# Patient Record
Sex: Female | Born: 1945 | Race: Black or African American | Hispanic: No | Smoking: Never smoker
Health system: Southern US, Community
[De-identification: ages and names within clinical notes are randomized; demographics above are authoritative.]

## PROBLEM LIST (undated history)

## (undated) DIAGNOSIS — K219 Gastro-esophageal reflux disease without esophagitis: Secondary | ICD-10-CM

## (undated) DIAGNOSIS — D472 Monoclonal gammopathy: Principal | ICD-10-CM

## (undated) DIAGNOSIS — I10 Essential (primary) hypertension: Secondary | ICD-10-CM

## (undated) DIAGNOSIS — C801 Malignant (primary) neoplasm, unspecified: Secondary | ICD-10-CM

## (undated) HISTORY — PX: ABDOMINAL HYSTERECTOMY: SHX81

## (undated) HISTORY — PX: BREAST SURGERY: SHX581

## (undated) HISTORY — PX: BREAST CYST EXCISION: SHX579

## (undated) HISTORY — PX: KNEE ARTHROSCOPY: SUR90

## (undated) HISTORY — PX: EYE SURGERY: SHX253

## (undated) HISTORY — PX: CATARACT EXTRACTION W/ INTRAOCULAR LENS IMPLANT: SHX1309

## (undated) HISTORY — DX: Monoclonal gammopathy: D47.2

---

## 2013-05-01 DIAGNOSIS — I10 Essential (primary) hypertension: Secondary | ICD-10-CM | POA: Insufficient documentation

## 2014-06-03 DIAGNOSIS — M5386 Other specified dorsopathies, lumbar region: Secondary | ICD-10-CM | POA: Insufficient documentation

## 2014-06-23 ENCOUNTER — Encounter (HOSPITAL_BASED_OUTPATIENT_CLINIC_OR_DEPARTMENT_OTHER): Payer: Self-pay

## 2014-06-23 ENCOUNTER — Emergency Department (HOSPITAL_BASED_OUTPATIENT_CLINIC_OR_DEPARTMENT_OTHER)
Admission: EM | Admit: 2014-06-23 | Discharge: 2014-06-23 | Disposition: A | Discharge status: Home / Self Care | Payer: Medicare Other | Attending: Emergency Medicine | Admitting: Emergency Medicine

## 2014-06-23 DIAGNOSIS — Z7982 Long term (current) use of aspirin: Secondary | ICD-10-CM | POA: Diagnosis not present

## 2014-06-23 DIAGNOSIS — R002 Palpitations: Secondary | ICD-10-CM | POA: Diagnosis not present

## 2014-06-23 DIAGNOSIS — G4700 Insomnia, unspecified: Secondary | ICD-10-CM | POA: Insufficient documentation

## 2014-06-23 DIAGNOSIS — F419 Anxiety disorder, unspecified: Secondary | ICD-10-CM | POA: Diagnosis present

## 2014-06-23 DIAGNOSIS — Z79899 Other long term (current) drug therapy: Secondary | ICD-10-CM | POA: Diagnosis not present

## 2014-06-23 DIAGNOSIS — I10 Essential (primary) hypertension: Secondary | ICD-10-CM | POA: Insufficient documentation

## 2014-06-23 HISTORY — DX: Essential (primary) hypertension: I10

## 2014-06-23 LAB — CBC WITH DIFFERENTIAL/PLATELET
BASOS PCT: 1 % (ref 0–1)
Basophils Absolute: 0 10*3/uL (ref 0.0–0.1)
EOS PCT: 2 % (ref 0–5)
Eosinophils Absolute: 0.1 10*3/uL (ref 0.0–0.7)
HCT: 36.3 % (ref 36.0–46.0)
HEMOGLOBIN: 12.2 g/dL (ref 12.0–15.0)
Lymphocytes Relative: 25 % (ref 12–46)
Lymphs Abs: 1.6 10*3/uL (ref 0.7–4.0)
MCH: 27.9 pg (ref 26.0–34.0)
MCHC: 33.6 g/dL (ref 30.0–36.0)
MCV: 82.9 fL (ref 78.0–100.0)
Monocytes Absolute: 0.4 10*3/uL (ref 0.1–1.0)
Monocytes Relative: 7 % (ref 3–12)
NEUTROS ABS: 4 10*3/uL (ref 1.7–7.7)
Neutrophils Relative %: 65 % (ref 43–77)
Platelets: 397 10*3/uL (ref 150–400)
RBC: 4.38 MIL/uL (ref 3.87–5.11)
RDW: 15 % (ref 11.5–15.5)
WBC: 6.2 10*3/uL (ref 4.0–10.5)

## 2014-06-23 LAB — BASIC METABOLIC PANEL
Anion gap: 7 (ref 5–15)
BUN: 18 mg/dL (ref 6–23)
CHLORIDE: 103 mmol/L (ref 96–112)
CO2: 26 mmol/L (ref 19–32)
CREATININE: 0.84 mg/dL (ref 0.50–1.10)
Calcium: 9.4 mg/dL (ref 8.4–10.5)
GFR calc non Af Amer: 69 mL/min — ABNORMAL LOW (ref >90)
GFR, EST AFRICAN AMERICAN: 80 mL/min — AB (ref >90)
GLUCOSE: 104 mg/dL — AB (ref 70–99)
POTASSIUM: 3.6 mmol/L (ref 3.5–5.1)
Sodium: 136 mmol/L (ref 135–145)

## 2014-06-23 LAB — URINALYSIS, ROUTINE W REFLEX MICROSCOPIC
BILIRUBIN URINE: NEGATIVE
Glucose, UA: NEGATIVE mg/dL
Hgb urine dipstick: NEGATIVE
Ketones, ur: NEGATIVE mg/dL
LEUKOCYTES UA: NEGATIVE
Nitrite: NEGATIVE
Protein, ur: NEGATIVE mg/dL
Specific Gravity, Urine: 1.014 (ref 1.005–1.030)
Urobilinogen, UA: 0.2 mg/dL (ref 0.0–1.0)
pH: 6 (ref 5.0–8.0)

## 2014-06-23 LAB — TROPONIN I: Troponin I: <0.03 ng/mL (ref <0.031)

## 2014-06-23 LAB — TSH: TSH: 2.451 u[IU]/mL (ref 0.350–4.500)

## 2014-06-23 NOTE — ED Provider Notes (Signed)
CSN: [id number]     Arrival date & time 06/23/14  1317 History   First MD Initiated Contact with Patient 06/23/14 1358     Chief Complaint  Patient presents with  . Irregular Heart Beat     (Consider location/radiation/quality/duration/timing/severity/associated sxs/prior Treatment) HPI Kelly Chase is a 69 y.o. female with hx of htn, presents to ED with complaint of anxiety, palpitations, feeling like heart skipping a beat. Pt states she is often very anxious. States that today she was sitting down and reading when symptoms began. States she did not have chest pain, no sob, no dizziness, states "i just did not feel right." symptoms lasted just few seconds and went away. States she felt very anxious after so came to ED. States that she believes her anxiety is from not sleeping. States she on average gets 2-3 hrs of sleep. Her doctor has started her on trazadone but she is not taking it because it makes her groggy the next day. She is currently asymptomatic, family at bedside.   Past Medical History  Diagnosis Date  . Hypertension    Past Surgical History  Procedure Laterality Date  . Eye surgery    . Abdominal hysterectomy    . Breast surgery    . Knee arthroscopy     No family history on file. History  Substance Use Topics  . Smoking status: Never Smoker   . Smokeless tobacco: Not on file  . Alcohol Use: No   OB History    No data available     Review of Systems  Constitutional: Negative for fever and chills.  Respiratory: Negative for cough, chest tightness and shortness of breath.   Cardiovascular: Positive for palpitations. Negative for chest pain and leg swelling.  Gastrointestinal: Negative for nausea, vomiting, abdominal pain and diarrhea.  Genitourinary: Negative for dysuria and flank pain.  Musculoskeletal: Negative for myalgias, arthralgias, neck pain and neck stiffness.  Skin: Negative for rash.  Neurological: Negative for dizziness, weakness and headaches.  All  other systems reviewed and are negative.     Allergies  Esomeprazole and Feldene  Home Medications   Prior to Admission medications   Medication Sig Start Date End Date Taking? Authorizing Provider  amLODipine (NORVASC) 10 MG tablet Take 10 mg by mouth daily.   Yes Historical Provider, MD  aspirin 81 MG tablet Take 81 mg by mouth daily.   Yes Historical Provider, MD  lisinopril (PRINIVIL,ZESTRIL) 40 MG tablet Take 40 mg by mouth daily.   Yes Historical Provider, MD   BP 166/89 mmHg  Pulse 75  Temp(Src) 97.7 F (36.5 C) (Oral)  Resp 18  Ht 5\' 6"  (1.676 m)  Wt 185 lb (83.915 kg)  BMI 29.87 kg/m2  SpO2 100% Physical Exam  Constitutional: She appears well-developed and well-nourished. No distress.  HENT:  Head: Normocephalic.  Eyes: Conjunctivae are normal.  Neck: Neck supple.  Cardiovascular: Normal rate, regular rhythm and normal heart sounds.   Pulmonary/Chest: Effort normal and breath sounds normal. No respiratory distress. She has no wheezes. She has no rales. She exhibits no tenderness.  Abdominal: Soft. Bowel sounds are normal. She exhibits no distension. There is no tenderness. There is no rebound.  Musculoskeletal: She exhibits no edema.  Neurological: She is alert.  Skin: Skin is warm and dry.  Psychiatric: She has a normal mood and affect. Her behavior is normal.  Nursing note and vitals reviewed.   ED Course  Procedures (including critical care time) Labs Review Labs Reviewed  BASIC  METABOLIC PANEL - Abnormal; Notable for the following:    Glucose, Bld 104 (*)    GFR calc non Af Amer 69 (*)    GFR calc Af Amer 80 (*)    All other components within normal limits  URINALYSIS, ROUTINE W REFLEX MICROSCOPIC  CBC WITH DIFFERENTIAL/PLATELET  TROPONIN I  TSH    Imaging Review No results found.   EKG Interpretation   Date/Time:  Sunday June 23 2014 13:30:32 EDT Ventricular Rate:  78 PR Interval:  150 QRS Duration: 82 QT Interval:  376 QTC  Calculation: 428 R Axis:   35 Text Interpretation:  Normal sinus rhythm Nonspecific ST and T wave  abnormality Abnormal ECG No old tracing to compare Confirmed by [facility]   MD, TREY ([id number]) on 06/23/2014 1:55:46 PM      MDM   Final diagnoses:  Palpitations  Anxiety  Insomnia    Patient with anxiety, insomnia, palpitations earlier today that lasted just a few seconds. EKG here is unremarkable. Patient is in normal sinus rhythm. Vital signs show elevated blood pressure, otherwise normal. Will monitor, check labs, troponin, TSH.  3:25 PM pts labs are all within normal. Pt feeling well. bp still elevated, did not take her meds at home. Symptoms most likely due to PVCs. Advised to follow up closely with pcp, event monitor if continues to have symptoms. No PVCs or arrhythmias while in ED. Advised melatonin for sleep. Return precautions discussed.   Filed Vitals:   06/23/14 1323 06/23/14 1521  BP: 166/89 157/80  Pulse: 75 70  Temp: 97.7 F (36.5 C)   TempSrc: Oral   Resp: 18 16  Height: 5\' 6"  (1.676 m)   Weight: 185 lb (83.915 kg)   SpO2: 100% 100%     Jeannett Senior, PA-C 06/23/14 [place], MD 06/24/14 0030

## 2014-06-23 NOTE — Discharge Instructions (Signed)
Please follow up with primary care doctor for recheck of your blood pressure and further evaluation of your symptoms. You may need an event monitor test if these continue. Try melatonin for sleep. Return if worsening symptoms.   Premature Beats A premature beat is an extra heartbeat that happens earlier than normal. Premature beats are called premature atrial contractions (PACs) or premature ventricular contractions (PVCs) depending on the area of the heart where they start. CAUSES  Premature beats may be brought on by a variety of factors including:  Emotional stress.  Lack of sleep.  Caffeine.  Asthma medicines.  Stimulants.  Herbal teas.  Dietary supplements.  Alcohol. In most cases, premature beats are not dangerous and are not a sign of serious heart disease. Most patients evaluated for premature beats have completely normal heart function. Rarely, premature beats may be a sign of more significant heart problems or medical illness. SYMPTOMS  Premature beats may cause palpitations. This means you feel like your heart is skipping a beat or beating harder than usual. Sometimes, slight chest pain occurs with premature beats, lasting only a few seconds. This pain has been described as a "flopping" feeling inside the chest. In many cases, premature beats do not cause any symptoms and they are only detected when an electrocardiography test (EKG) or heart monitoring is performed. DIAGNOSIS  Your caregiver may run some tests to evaluate your heart such as an EKG or echocardiography. You may need to wear a portable heart monitor for several days to record the electrical activity of your heart. Blood testing may also be performed to check your electrolytes and thyroid function. TREATMENT  Premature beats usually go away with rest. If the problem continues, your caregiver will determine a treatment plan for you.  HOME CARE INSTRUCTIONS  Get plenty of rest over the next few days until your  symptoms improve.  Avoid coffee, tea, alcohol, and soda (pop, cola).  Do not smoke. SEEK MEDICAL CARE IF:  Your symptoms continue after 1 to 2 days of rest.  You have new symptoms, such as chest pain or trouble breathing. SEEK IMMEDIATE MEDICAL CARE IF:  You have severe chest pain or abdominal pain.  You have pain that radiates into the neck, arm, or jaw.  You faint or have extreme weakness.  You have shortness of breath.  Your heartbeat races for more than 5 seconds. MAKE SURE YOU:  Understand these instructions.  Will watch your condition.  Will get help right away if you are not doing well or get worse. Document Released: 04/15/2004 Document Revised: 05/31/2011 Document Reviewed: 11/09/2010 [facility] Patient Information 2015 [place], [place]. This information is not intended to replace advice given to you by your health care provider. Make sure you discuss any questions you have with your health care provider.

## 2014-06-23 NOTE — ED Provider Notes (Signed)
Patient had 2 episodes of "missed heartbeat" lasting a split-second today at noon and again at 12:30 PM. No weakness no shortness of breath no chest pain. She reports a milder episode ,, diagnosed palpitations or gets also lasting a split-second earlier this week. Presently asymptomatic. No distress lungs clear auscultation heart regular rate and rhythm no murmurs  Orlie Dakin, MD 06/23/14 1525

## 2014-06-23 NOTE — ED Notes (Signed)
Pt reports today was sitting down and suddenly felt as if her heart skipped a beat and then felt "nervous".  Denies cp or sob.  States she had another episode of same during beginning of week.

## 2014-08-23 ENCOUNTER — Telehealth: Payer: Self-pay | Admitting: Hematology & Oncology

## 2014-08-23 NOTE — Telephone Encounter (Signed)
Lt mess regarding future np appt for 7/5 at 10:30am.

## 2014-08-26 ENCOUNTER — Telehealth: Payer: Self-pay | Admitting: Hematology & Oncology

## 2014-08-26 NOTE — Telephone Encounter (Signed)
Pt confirm np appt for 7/5.

## 2014-09-20 ENCOUNTER — Telehealth: Payer: Self-pay

## 2014-09-20 NOTE — Telephone Encounter (Signed)
LVM to call back and confirm appt for 09/24/14 starting at 10:30am

## 2014-09-24 ENCOUNTER — Other Ambulatory Visit (HOSPITAL_BASED_OUTPATIENT_CLINIC_OR_DEPARTMENT_OTHER): Payer: Medicare Other

## 2014-09-24 ENCOUNTER — Encounter: Payer: Self-pay | Admitting: Hematology & Oncology

## 2014-09-24 ENCOUNTER — Ambulatory Visit (HOSPITAL_BASED_OUTPATIENT_CLINIC_OR_DEPARTMENT_OTHER): Payer: Medicare Other | Admitting: Hematology & Oncology

## 2014-09-24 ENCOUNTER — Ambulatory Visit: Payer: Medicare Other

## 2014-09-24 VITALS — BP 150/72 | HR 84 | Temp 97.6°F | Resp 14 | Ht 66.0 in | Wt 186.0 lb

## 2014-09-24 DIAGNOSIS — D472 Monoclonal gammopathy: Secondary | ICD-10-CM | POA: Diagnosis present

## 2014-09-24 HISTORY — DX: Monoclonal gammopathy: D47.2

## 2014-09-24 LAB — CBC WITH DIFFERENTIAL (CANCER CENTER ONLY)
BASO#: 0 10*3/uL (ref 0.0–0.2)
BASO%: 0.6 % (ref 0.0–2.0)
EOS%: 5.3 % (ref 0.0–7.0)
Eosinophils Absolute: 0.3 10*3/uL (ref 0.0–0.5)
HCT: 34.6 % — ABNORMAL LOW (ref 34.8–46.6)
HGB: 11.7 g/dL (ref 11.6–15.9)
LYMPH#: 2.4 10*3/uL (ref 0.9–3.3)
LYMPH%: 45.2 % (ref 14.0–48.0)
MCH: 28.2 pg (ref 26.0–34.0)
MCHC: 33.8 g/dL (ref 32.0–36.0)
MCV: 83 fL (ref 81–101)
MONO#: 0.4 10*3/uL (ref 0.1–0.9)
MONO%: 7.2 % (ref 0.0–13.0)
NEUT#: 2.2 10*3/uL (ref 1.5–6.5)
NEUT%: 41.7 % (ref 39.6–80.0)
PLATELETS: 354 10*3/uL (ref 145–400)
RBC: 4.15 10*6/uL (ref 3.70–5.32)
RDW: 14.4 % (ref 11.1–15.7)
WBC: 5.3 10*3/uL (ref 3.9–10.0)

## 2014-09-24 LAB — COMPREHENSIVE METABOLIC PANEL
ALK PHOS: 79 U/L (ref 39–117)
ALT: 17 U/L (ref 0–35)
AST: 15 U/L (ref 0–37)
Albumin: 4.2 g/dL (ref 3.5–5.2)
BUN: 15 mg/dL (ref 6–23)
CALCIUM: 9.3 mg/dL (ref 8.4–10.5)
CHLORIDE: 105 meq/L (ref 96–112)
CO2: 26 mEq/L (ref 19–32)
Creatinine, Ser: 0.88 mg/dL (ref 0.50–1.10)
GLUCOSE: 88 mg/dL (ref 70–99)
Potassium: 3.9 mEq/L (ref 3.5–5.3)
Sodium: 136 mEq/L (ref 135–145)
Total Bilirubin: 0.7 mg/dL (ref 0.2–1.2)
Total Protein: 8 g/dL (ref 6.0–8.3)

## 2014-09-24 LAB — CHCC SATELLITE - SMEAR

## 2014-09-24 NOTE — Progress Notes (Signed)
Referral MD  Reason for Referral: MGUS  Chief Complaint  Patient presents with  . NEW PATIENT  : I am not sure why I am here.  HPI: Kelly Chase is very charming 69 year old Afro-American female. She has a very strong faith. We had good fellowship.  She is followed locally. Apparently, she had blood work done back in February which showed a monoclonal spike. [place] as to why this was done. Her M spike was 1.5 g/dL. She had mild anemia. No electrolytes were drawn that I can tell.  She was then referred to [place].  She looks great. She feels okay. She's had no problem with infections. She's had no bone issues. She's had no change in bowel or bladder habits. She gets her mammograms routinely.  There's been no bleeding. She's had no fever. There's been no cough.  Again, she is, referred to the [facility] for an evaluation.    Past Medical History  Diagnosis Date  . Hypertension   :  Past Surgical History  Procedure Laterality Date  . Eye surgery    . Abdominal hysterectomy    . Breast surgery    . Knee arthroscopy    :   Current outpatient prescriptions:  .  amLODipine (NORVASC) 10 MG tablet, Take 10 mg by mouth daily., Disp: , Rfl:  .  aspirin 81 MG tablet, Take 81 mg by mouth daily., Disp: , Rfl:  .  calcium & magnesium carbonates (MYLANTA) 311-232 MG per tablet, Take 1 tablet by mouth as needed for heartburn., Disp: , Rfl:  .  diphenhydrAMINE (SOMINEX) 25 MG tablet, Take 25 mg by mouth as needed for sleep. allergy, Disp: , Rfl:  .  DUREZOL 0.05 % EMUL, PLACE 1 GTT INTO THE LEFT EYE BID, Disp: , Rfl: 2 .  lisinopril (PRINIVIL,ZESTRIL) 40 MG tablet, Take 40 mg by mouth daily., Disp: , Rfl: :  :  Allergies  Allergen Reactions  . Esomeprazole Other (See Comments)    Joint pain  . Feldene [Piroxicam] Other (See Comments)    abd "burning"  :  No family history on file.:  History   Social History  . Marital Status: Single    Spouse Name: N/A  .  Number of Children: N/A  . Years of Education: N/A   Occupational History  . Not on file.   Social History Main Topics  . Smoking status: Never Smoker   . Smokeless tobacco: Never Used     Comment: NEVER USED TOBACCO  . Alcohol Use: No  . Drug Use: No  . Sexual Activity: Not on file   Other Topics Concern  . Not on file   Social History Narrative  :  Pertinent items are noted in HPI.  Exam: _0 @ Wellwell-developed well-nourished African-American female. Head and neck exam shows no ocular or oral lesion. There are no palpable cervical or supraclavicular lymph nodes. Lungs are clear. Cardiac exam regular rate and rhythm with no murmurs, rubs or bruits. Abdomen is soft. She has good bowel sounds. There is no fluid wave. There is no palpable liver or spleen tip. Back exam shows no tenderness over the spine, ribs or hips. Extremities shows no clubbing, cyanosis or edema. Skin exam shows no rashes. Neurological exam is nonfocal.   Recent Labs  09/24/14 1033  WBC 5.3  HGB 11.7  HCT 34.6*  PLT 354   No results for input(s): NA, K, CL, CO2, GLUCOSE, BUN, CREATININE, CALCIUM in the last 72 hours.  Blood  smear review: normochromic and no was a population of red blood cells. There is no target cells. There is no rouleau formation. She has no nucleated red blood cells. White blood cells appear normal in morphology and maturation. There is no immature myeloid or lymphoid forms. There's no atypical lymphocytes. Platelets are adequate in number and size.  Pathology:none    Assessment and Plan: Kelly Chase is a 69 year old aftermarket female with an M spike. I suppose this is MGUS. I be very surprised if she has myeloma or even smoldering myeloma.  We will see what her lab work shows. I don't want to get too aggressive right now. Her blood smear is unremarkable. Her physical exam is pretty much normal.  I think we can just follow her along. If we find significant abnormalities on  her blood work, then we can certainly pursue additional studies, such as bone survey and ultimately a bone marrow biopsy.  I spent about 45 minutes with her. I told her that we would call her once we got the results back from our labs.  I answered all of her questions. We had a great prayer session. She wanted me to pray with her.

## 2014-09-26 LAB — PROTEIN ELECTROPHORESIS, SERUM, WITH REFLEX
ABNORMAL PROTEIN BAND1: 1.3 g/dL
ALPHA-1-GLOBULIN: 0.4 g/dL — AB (ref 0.2–0.3)
ALPHA-2-GLOBULIN: 0.5 g/dL (ref 0.5–0.9)
Albumin ELP: 4.3 g/dL (ref 3.8–4.8)
Beta 2: 0.5 g/dL (ref 0.2–0.5)
Beta Globulin: 0.5 g/dL (ref 0.4–0.6)
Gamma Globulin: 2.2 g/dL — ABNORMAL HIGH (ref 0.8–1.7)
Total Protein, Serum Electrophoresis: 8.3 g/dL — ABNORMAL HIGH (ref 6.1–8.1)

## 2014-09-26 LAB — KAPPA/LAMBDA LIGHT CHAINS
KAPPA LAMBDA RATIO: 1.66 — AB (ref 0.26–1.65)
Kappa free light chain: 2.46 mg/dL — ABNORMAL HIGH (ref 0.33–1.94)
Lambda Free Lght Chn: 1.48 mg/dL (ref 0.57–2.63)

## 2014-09-26 LAB — IGG, IGA, IGM
IGM, SERUM: 139 mg/dL (ref 52–322)
IgA: 283 mg/dL (ref 69–380)
IgG (Immunoglobin G), Serum: 2250 mg/dL — ABNORMAL HIGH (ref 690–1700)

## 2014-10-11 ENCOUNTER — Other Ambulatory Visit: Payer: Self-pay | Admitting: Hematology & Oncology

## 2014-10-11 DIAGNOSIS — D472 Monoclonal gammopathy: Secondary | ICD-10-CM

## 2014-12-11 ENCOUNTER — Other Ambulatory Visit: Payer: Self-pay | Admitting: Hematology & Oncology

## 2014-12-11 DIAGNOSIS — D472 Monoclonal gammopathy: Secondary | ICD-10-CM

## 2015-01-14 ENCOUNTER — Ambulatory Visit: Payer: Medicare Other | Admitting: Hematology & Oncology

## 2015-01-14 ENCOUNTER — Other Ambulatory Visit: Payer: Medicare Other

## 2015-02-04 ENCOUNTER — Encounter (HOSPITAL_BASED_OUTPATIENT_CLINIC_OR_DEPARTMENT_OTHER): Payer: Self-pay | Admitting: Emergency Medicine

## 2015-02-04 ENCOUNTER — Emergency Department (HOSPITAL_BASED_OUTPATIENT_CLINIC_OR_DEPARTMENT_OTHER)
Admission: EM | Admit: 2015-02-04 | Discharge: 2015-02-04 | Disposition: A | Discharge status: Home / Self Care | Payer: Medicare Other | Attending: Emergency Medicine | Admitting: Emergency Medicine

## 2015-02-04 DIAGNOSIS — F419 Anxiety disorder, unspecified: Secondary | ICD-10-CM

## 2015-02-04 DIAGNOSIS — I10 Essential (primary) hypertension: Secondary | ICD-10-CM | POA: Insufficient documentation

## 2015-02-04 DIAGNOSIS — Z7982 Long term (current) use of aspirin: Secondary | ICD-10-CM | POA: Insufficient documentation

## 2015-02-04 DIAGNOSIS — Z79899 Other long term (current) drug therapy: Secondary | ICD-10-CM | POA: Diagnosis not present

## 2015-02-04 DIAGNOSIS — G4700 Insomnia, unspecified: Secondary | ICD-10-CM | POA: Insufficient documentation

## 2015-02-04 DIAGNOSIS — Z862 Personal history of diseases of the blood and blood-forming organs and certain disorders involving the immune mechanism: Secondary | ICD-10-CM | POA: Insufficient documentation

## 2015-02-04 MED ORDER — ZOLPIDEM TARTRATE 5 MG PO TABS: 5.0000 mg | ORAL_TABLET | Freq: Every evening | ORAL | Status: DC | PRN

## 2015-02-04 NOTE — ED Notes (Signed)
Patient states that about an hour ago she started to feel jittery and "anxious" - she feels like she was having a panic attack. The patient reports that she feels a little weak now and a little nervous. The patient reports that mostly all the other symptoms are gone

## 2015-02-04 NOTE — Discharge Instructions (Signed)
You were seen today for anxiety.  You are also having trouble sleeping.  You need to follow-up with your primary doctor for medication recommendations.  Panic Attacks Panic attacks are sudden, short feelings of great fear or discomfort. You may have them for no reason when you are relaxed, when you are uneasy (anxious), or when you are sleeping.  HOME CARE  Take all your medicines as told.  Check with your doctor before starting new medicines.  Keep all doctor visits. GET HELP IF:  You are not able to take your medicines as told.  Your symptoms do not get better.  Your symptoms get worse. GET HELP RIGHT AWAY IF:  Your attacks seem different than your normal attacks.  You have thoughts about hurting yourself or others.  You take panic attack medicine and you have a side effect. MAKE SURE YOU:  Understand these instructions.  Will watch your condition.  Will get help right away if you are not doing well or get worse.   This information is not intended to replace advice given to you by your health care provider. Make sure you discuss any questions you have with your health care provider.   Document Released: 04/10/2010 Document Revised: 12/27/2012 Document Reviewed: 10/20/2012 Elsevier Interactive Patient Education [employer].

## 2015-02-04 NOTE — ED Provider Notes (Signed)
CSN: [id number]     Arrival date & time 02/04/15  1910 History  By signing my name below, I, Kelly Chase, attest that this documentation has been prepared under the direction and in the presence of Merryl Hacker, MD.  Electronically Signed: Tula Chase, ED Scribe. 02/04/2015. 11:15 PM.   Chief Complaint  Patient presents with  . Anxiety   The history is provided by the patient. No language interpreter was used.    HPI Comments: Kelly Chase is a 69 y.o. female with a history of HTN who presents to the Emergency Department complaining of anxiety and feeling jittery that occurred earlier today, but is resolved. Pt reports a history of the same that occurs when she does not get a lot of sleep. She was evaluated by her PCP for the same several months ago, who prescribed Trazadone with no relief to her symptoms. Patient states that trazodone does not help her sleep. She has not slept well the last several days. She is currently asymptomatic. Pt denies allergies to medications. She also denies CP and SOB as associated symptoms.  Past Medical History  Diagnosis Date  . Hypertension   . MGUS (monoclonal gammopathy of unknown significance) 09/24/2014   Past Surgical History  Procedure Laterality Date  . Eye surgery    . Abdominal hysterectomy    . Breast surgery    . Knee arthroscopy     History reviewed. No pertinent family history. Social History  Substance Use Topics  . Smoking status: Never Smoker   . Smokeless tobacco: Never Used     Comment: NEVER USED TOBACCO  . Alcohol Use: No   OB History    No data available     Review of Systems  Constitutional: Negative for fever.  Respiratory: Negative for shortness of breath.   Cardiovascular: Negative for chest pain.  Gastrointestinal: Negative for abdominal pain.  Psychiatric/Behavioral: The patient is nervous/anxious.   All other systems reviewed and are negative.  Allergies  Esomeprazole and Feldene  Home  Medications   Prior to Admission medications   Medication Sig Start Date End Date Taking? Authorizing Provider  amLODipine (NORVASC) 10 MG tablet Take 10 mg by mouth daily.    Historical Provider, MD  aspirin 81 MG tablet Take 81 mg by mouth daily.    Historical Provider, MD  calcium & magnesium carbonates (MYLANTA) 311-232 MG per tablet Take 1 tablet by mouth as needed for heartburn.    Historical Provider, MD  diphenhydrAMINE (SOMINEX) 25 MG tablet Take 25 mg by mouth as needed for sleep. allergy    Historical Provider, MD  DUREZOL 0.05 % EMUL PLACE 1 GTT INTO THE LEFT EYE BID 09/14/14   Historical Provider, MD  lisinopril (PRINIVIL,ZESTRIL) 40 MG tablet Take 40 mg by mouth daily.    Historical Provider, MD  zolpidem (AMBIEN) 5 MG tablet Take 1 tablet (5 mg total) by mouth at bedtime as needed for sleep. 02/04/15   Merryl Hacker, MD   BP 145/85 mmHg  Pulse 82  Temp(Src) 97.8 F (36.6 C) (Oral)  Resp 16  Ht 5\' 5"  (1.651 m)  Wt 184 lb (83.462 kg)  BMI 30.62 kg/m2  SpO2 100% Physical Exam  Constitutional: She is oriented to person, place, and time. She appears well-developed and well-nourished.  Appears younger than stated age  HENT:  Head: Normocephalic and atraumatic.  Cardiovascular: Normal rate, regular rhythm and normal heart sounds.   No murmur heard. Pulmonary/Chest: Effort normal and breath sounds normal.  No respiratory distress. She has no wheezes.  Musculoskeletal: She exhibits no edema.  Neurological: She is alert and oriented to person, place, and time.  Skin: Skin is warm and dry.  Psychiatric:  Calm  Nursing note and vitals reviewed.   ED Course  Procedures  DIAGNOSTIC STUDIES: Oxygen Saturation is 100% on RA, normal by my interpretation.    COORDINATION OF CARE: 11:14 PM Discussed treatment plan with pt which includes Ambien and follow-up with her PCP. Pt agreed to plan.  Labs Review Labs Reviewed - No data to display  Imaging Review No results  found.   EKG Interpretation None      MDM   Final diagnoses:  Anxiety  Insomnia    Patient presents with feelings of anxiety and jitteriness. Nontoxic on exam. Afebrile and vital signs are reassuring. She is currently asymptomatic. Relates symptoms to difficulty sleeping. She reports that trazodone is not helping her. She has no other systemic symptoms. She is requesting something for sleep. Patient reports that she was seen by her primary physician on Tuesday and had "normal lab work." No further laboratory or imaging evaluation at this time. I discussed with the patient that she would ultimately need to follow-up with her primary physician regarding sleep medication; however, I will provide her with a limited prescription of Ambien to see if this helps. Patient stated understanding.  After history, exam, and medical workup I feel the patient has been appropriately medically screened and is safe for discharge home. Pertinent diagnoses were discussed with the patient. Patient was given return precautions.  I personally performed the services described in this documentation, which was scribed in my presence. The recorded information has been reviewed and is accurate.    Merryl Hacker, MD 02/05/15 [phone]

## 2015-04-11 ENCOUNTER — Encounter: Payer: Self-pay | Admitting: Hematology & Oncology

## 2015-04-11 ENCOUNTER — Other Ambulatory Visit (HOSPITAL_BASED_OUTPATIENT_CLINIC_OR_DEPARTMENT_OTHER): Payer: Medicare Other

## 2015-04-11 ENCOUNTER — Other Ambulatory Visit: Payer: Self-pay | Admitting: Hematology & Oncology

## 2015-04-11 ENCOUNTER — Ambulatory Visit (HOSPITAL_BASED_OUTPATIENT_CLINIC_OR_DEPARTMENT_OTHER): Payer: Medicare Other | Admitting: Hematology & Oncology

## 2015-04-11 VITALS — BP 150/82 | HR 79 | Temp 97.4°F | Resp 16 | Ht 65.0 in | Wt 184.0 lb

## 2015-04-11 DIAGNOSIS — G4701 Insomnia due to medical condition: Secondary | ICD-10-CM

## 2015-04-11 DIAGNOSIS — Z1231 Encounter for screening mammogram for malignant neoplasm of breast: Secondary | ICD-10-CM

## 2015-04-11 DIAGNOSIS — D472 Monoclonal gammopathy: Secondary | ICD-10-CM

## 2015-04-11 DIAGNOSIS — C50011 Malignant neoplasm of nipple and areola, right female breast: Secondary | ICD-10-CM

## 2015-04-11 DIAGNOSIS — Z1239 Encounter for other screening for malignant neoplasm of breast: Secondary | ICD-10-CM

## 2015-04-11 DIAGNOSIS — N63 Unspecified lump in unspecified breast: Secondary | ICD-10-CM

## 2015-04-11 DIAGNOSIS — C50012 Malignant neoplasm of nipple and areola, left female breast: Secondary | ICD-10-CM

## 2015-04-11 LAB — CMP (CANCER CENTER ONLY)
ALT(SGPT): 24 U/L (ref 10–47)
AST: 21 U/L (ref 11–38)
Albumin: 3.8 g/dL (ref 3.3–5.5)
Alkaline Phosphatase: 76 U/L (ref 26–84)
BUN: 12 mg/dL (ref 7–22)
CALCIUM: 8.9 mg/dL (ref 8.0–10.3)
CHLORIDE: 105 meq/L (ref 98–108)
CO2: 27 mEq/L (ref 18–33)
Creat: 1 mg/dl (ref 0.6–1.2)
GLUCOSE: 88 mg/dL (ref 73–118)
Potassium: 3.6 mEq/L (ref 3.3–4.7)
SODIUM: 140 meq/L (ref 128–145)
Total Bilirubin: 1 mg/dl (ref 0.20–1.60)
Total Protein: 8.8 g/dL — ABNORMAL HIGH (ref 6.4–8.1)

## 2015-04-11 LAB — CBC WITH DIFFERENTIAL (CANCER CENTER ONLY)
BASO#: 0 10*3/uL (ref 0.0–0.2)
BASO%: 0.4 % (ref 0.0–2.0)
EOS ABS: 0.4 10*3/uL (ref 0.0–0.5)
EOS%: 6.8 % (ref 0.0–7.0)
HCT: 34.8 % (ref 34.8–46.6)
HGB: 11.6 g/dL (ref 11.6–15.9)
LYMPH#: 2.2 10*3/uL (ref 0.9–3.3)
LYMPH%: 39.7 % (ref 14.0–48.0)
MCH: 27.8 pg (ref 26.0–34.0)
MCHC: 33.3 g/dL (ref 32.0–36.0)
MCV: 84 fL (ref 81–101)
MONO#: 0.4 10*3/uL (ref 0.1–0.9)
MONO%: 7.5 % (ref 0.0–13.0)
NEUT#: 2.6 10*3/uL (ref 1.5–6.5)
NEUT%: 45.6 % (ref 39.6–80.0)
PLATELETS: 366 10*3/uL (ref 145–400)
RBC: 4.17 10*6/uL (ref 3.70–5.32)
RDW: 14.7 % (ref 11.1–15.7)
WBC: 5.6 10*3/uL (ref 3.9–10.0)

## 2015-04-11 LAB — LACTATE DEHYDROGENASE: LDH: 275 U/L — ABNORMAL HIGH (ref 125–245)

## 2015-04-11 MED ORDER — ZOLPIDEM TARTRATE 5 MG PO TABS: 5.0000 mg | ORAL_TABLET | Freq: Every evening | ORAL | Status: DC | PRN

## 2015-04-11 NOTE — Progress Notes (Signed)
Hematology and Oncology Follow Up Visit  Kelly Chase [id number] 1945-07-29 70 y.o. 04/11/2015   Principle Diagnosis:   IgG Kappa MGUS  Current Therapy:    Observation     Interim History:  Kelly Chase is back for a second office visit. We first saw her back in July 2016. We'll refer saw her, I feel that she had a MGUS. We did do the myeloma studies. She had a monoclonal spike of 1.3 g/dL. This is found to be an IgG Kappa spike. Her IgG level was 2250 mg/dL. Her Kappa Lightchain was 2.46 mg/dL.  She is totally asymptomatic so I do not think that we had to put her through a bone survey.  I did not do 24-hour urine on her.  Since we first saw her, she been doing well. She's had no problems with nausea vomiting. She's had no infections. She's had no bone pain. She's had no change in bowel or bladder habits. There she's not had a mammogram for 2 years. She really needs to have one done.  She's had no problems with leg swelling. She's had no joint issues.    Medications:  Current outpatient prescriptions:  .  amLODipine (NORVASC) 10 MG tablet, Take 10 mg by mouth daily., Disp: , Rfl:  .  aspirin 81 MG tablet, Take 81 mg by mouth daily., Disp: , Rfl:  .  calcium & magnesium carbonates (MYLANTA) 311-232 MG per tablet, Take 1 tablet by mouth as needed for heartburn., Disp: , Rfl:  .  diphenhydrAMINE (SOMINEX) 25 MG tablet, Take 25 mg by mouth as needed for sleep. allergy, Disp: , Rfl:  .  DUREZOL 0.05 % EMUL, PLACE 1 GTT INTO THE LEFT EYE BID, Disp: , Rfl: 2  Allergies:  Allergies  Allergen Reactions  . Esomeprazole Other (See Comments)    Joint pain  . Feldene [Piroxicam] Other (See Comments)    abd "burning"    Past Medical History, Surgical history, Social history, and Family History were reviewed and updated.  Review of Systems: As above  Physical Exam:  height is '5\' 5"'$  (1.651 m) and weight is 184 lb (83.462 kg). Her oral temperature is 97.4 F (36.3 C). Her blood  pressure is 150/82 and her pulse is 79. Her respiration is 16.   Wt Readings from Last 3 Encounters:  04/11/15 184 lb (83.462 kg)  02/04/15 184 lb (83.462 kg)  09/24/14 186 lb (84.369 kg)     Head and neck exam shows no ocular or oral lesion. There are no palpable cervical or supraclavicular lymph nodes. Lungs are clear. Cardiac exam regular rate and rhythm with no murmurs, rubs or bruits. Abdomen is soft. She has good bowel sounds. There is no fluid wave. There is no palpable liver or spleen tip. Back exam shows no tenderness over the spine, ribs or hips. Extremities shows no clubbing, cyanosis or edema. Skin exam shows no rashes. Neurological exam is nonfocal.  Lab Results  Component Value Date   WBC 5.6 04/11/2015   HGB 11.6 04/11/2015   HCT 34.8 04/11/2015   MCV 84 04/11/2015   PLT 366 04/11/2015     Chemistry      Component Value Date/Time   NA 140 04/11/2015 0953   NA 136 09/24/2014 1035   K 3.6 04/11/2015 0953   K 3.9 09/24/2014 1035   CL 105 04/11/2015 0953   CL 105 09/24/2014 1035   CO2 27 04/11/2015 0953   CO2 26 09/24/2014 1035   BUN 12  04/11/2015 0953   BUN 15 09/24/2014 1035   CREATININE 1.0 04/11/2015 0953   CREATININE 0.88 09/24/2014 1035      Component Value Date/Time   CALCIUM 8.9 04/11/2015 0953   CALCIUM 9.3 09/24/2014 1035   ALKPHOS 76 04/11/2015 0953   ALKPHOS 79 09/24/2014 1035   AST 21 04/11/2015 0953   AST 15 09/24/2014 1035   ALT 24 04/11/2015 0953   ALT 17 09/24/2014 1035   BILITOT 1.00 04/11/2015 0953   BILITOT 0.7 09/24/2014 1035         Impression and Plan: Kelly Chase is a 70 year old African-American female with IgG Kappa MGUS. I do not believe that she has myeloma. I see nothing with the labs that show myeloma.  I looked at her blood smear. Her less pain was pretty much benign.  I think we can probably get her back in 6 months. I think this would be reasonable for follow-up.  I don't see need for a bone survey. I do not see  need for a bone marrow biopsy.  She does need to have a mammogram done.  I spent about 30 minutes with her.   Volanda Napoleon, MD 1/20/201711:01 AM

## 2015-04-11 NOTE — Addendum Note (Signed)
Addended by: Burney Gauze R on: 04/11/2015 11:28 AM   Modules accepted: Orders

## 2015-04-12 LAB — IGG, IGA, IGM
IGA/IMMUNOGLOBULIN A, SERUM: 189 mg/dL (ref 87–352)
IGM (IMMUNOGLOBIN M), SRM: 35 mg/dL (ref 26–217)

## 2015-04-12 LAB — BETA 2 MICROGLOBULIN, SERUM: Beta-2: 1.9 mg/L (ref 0.6–2.4)

## 2015-04-14 LAB — KAPPA/LAMBDA LIGHT CHAINS
IG KAPPA FREE LIGHT CHAIN: 25.12 mg/L — AB (ref 3.30–19.40)
IG LAMBDA FREE LIGHT CHAIN: 17.01 mg/L (ref 5.71–26.30)
KAPPA/LAMBDA FLC RATIO: 1.48 (ref 0.26–1.65)

## 2015-04-15 ENCOUNTER — Telehealth: Payer: Self-pay | Admitting: *Deleted

## 2015-04-15 NOTE — Telephone Encounter (Addendum)
Message left on personal voice mail  ----- Message from Volanda Napoleon, MD sent at 04/14/2015  4:50 PM EST ----- Call - blood work looks stable!!  No obvious change in labs!! This is great!!  pete

## 2015-04-16 LAB — PROTEIN ELECTROPHORESIS, SERUM, WITH REFLEX
A/G RATIO SPE: 1.1 (ref 0.7–1.7)
Albumin: 3.6 g/dL (ref 2.9–4.4)
Alpha 1: 0.3 g/dL (ref 0.0–0.4)
Alpha 2: 0.6 g/dL (ref 0.4–1.0)
Beta: 1 g/dL (ref 0.7–1.3)
GLOBULIN, TOTAL: 3.3 g/dL (ref 2.2–3.9)
Gamma Globulin: 1.5 g/dL (ref 0.4–1.8)
TOTAL PROTEIN: 6.9 g/dL (ref 6.0–8.5)

## 2015-04-18 ENCOUNTER — Ambulatory Visit (HOSPITAL_BASED_OUTPATIENT_CLINIC_OR_DEPARTMENT_OTHER): Payer: Medicare Other

## 2015-05-30 ENCOUNTER — Emergency Department (HOSPITAL_BASED_OUTPATIENT_CLINIC_OR_DEPARTMENT_OTHER)
Admission: EM | Admit: 2015-05-30 | Discharge: 2015-05-30 | Disposition: A | Discharge status: Home / Self Care | Payer: Medicare Other | Attending: Emergency Medicine | Admitting: Emergency Medicine

## 2015-05-30 ENCOUNTER — Encounter (HOSPITAL_BASED_OUTPATIENT_CLINIC_OR_DEPARTMENT_OTHER): Payer: Self-pay | Admitting: *Deleted

## 2015-05-30 DIAGNOSIS — R002 Palpitations: Secondary | ICD-10-CM | POA: Insufficient documentation

## 2015-05-30 DIAGNOSIS — F410 Panic disorder [episodic paroxysmal anxiety] without agoraphobia: Secondary | ICD-10-CM | POA: Diagnosis not present

## 2015-05-30 DIAGNOSIS — R531 Weakness: Secondary | ICD-10-CM | POA: Diagnosis present

## 2015-05-30 DIAGNOSIS — R55 Syncope and collapse: Secondary | ICD-10-CM | POA: Diagnosis not present

## 2015-05-30 DIAGNOSIS — Z7982 Long term (current) use of aspirin: Secondary | ICD-10-CM | POA: Diagnosis not present

## 2015-05-30 DIAGNOSIS — I10 Essential (primary) hypertension: Secondary | ICD-10-CM | POA: Insufficient documentation

## 2015-05-30 DIAGNOSIS — Z79899 Other long term (current) drug therapy: Secondary | ICD-10-CM | POA: Diagnosis not present

## 2015-05-30 DIAGNOSIS — Z862 Personal history of diseases of the blood and blood-forming organs and certain disorders involving the immune mechanism: Secondary | ICD-10-CM | POA: Insufficient documentation

## 2015-05-30 DIAGNOSIS — R42 Dizziness and giddiness: Secondary | ICD-10-CM | POA: Diagnosis not present

## 2015-05-30 LAB — CBC WITH DIFFERENTIAL/PLATELET
BASOS ABS: 0 10*3/uL (ref 0.0–0.1)
BASOS PCT: 0 %
EOS ABS: 0.1 10*3/uL (ref 0.0–0.7)
Eosinophils Relative: 2 %
HCT: 35.3 % — ABNORMAL LOW (ref 36.0–46.0)
Hemoglobin: 11.9 g/dL — ABNORMAL LOW (ref 12.0–15.0)
Lymphocytes Relative: 20 %
Lymphs Abs: 1.1 10*3/uL (ref 0.7–4.0)
MCH: 27.7 pg (ref 26.0–34.0)
MCHC: 33.7 g/dL (ref 30.0–36.0)
MCV: 82.3 fL (ref 78.0–100.0)
MONOS PCT: 7 %
Monocytes Absolute: 0.4 10*3/uL (ref 0.1–1.0)
NEUTROS PCT: 71 %
Neutro Abs: 3.9 10*3/uL (ref 1.7–7.7)
PLATELETS: 341 10*3/uL (ref 150–400)
RBC: 4.29 MIL/uL (ref 3.87–5.11)
RDW: 14.9 % (ref 11.5–15.5)
WBC: 5.5 10*3/uL (ref 4.0–10.5)

## 2015-05-30 LAB — BASIC METABOLIC PANEL
ANION GAP: 10 (ref 5–15)
BUN: 13 mg/dL (ref 6–20)
CALCIUM: 9.1 mg/dL (ref 8.9–10.3)
CO2: 23 mmol/L (ref 22–32)
Chloride: 108 mmol/L (ref 101–111)
Creatinine, Ser: 0.92 mg/dL (ref 0.44–1.00)
GFR calc Af Amer: >60 mL/min (ref >60)
GLUCOSE: 105 mg/dL — AB (ref 65–99)
Potassium: 3.5 mmol/L (ref 3.5–5.1)
Sodium: 141 mmol/L (ref 135–145)

## 2015-05-30 LAB — CBG MONITORING, ED: Glucose-Capillary: 89 mg/dL (ref 65–99)

## 2015-05-30 NOTE — ED Notes (Addendum)
RN Moshe Salisbury asked for a repeat EKG.

## 2015-05-30 NOTE — ED Notes (Addendum)
Pt able to ambulate to restroom with no assistance, she states "feel normal walking, finally."

## 2015-05-30 NOTE — ED Notes (Addendum)
States she felt nervous, weak and jittery this am. She has not eaten today. States the last time she felt this way she was diagnosed with an anxiety attack.

## 2015-05-30 NOTE — ED Notes (Signed)
Patient placed on cardiac monitor.

## 2015-05-30 NOTE — ED Provider Notes (Signed)
CSN: [id number]     Arrival date & time 05/30/15  1245 History   First MD Initiated Contact with Patient 05/30/15 1304     Chief Complaint  Patient presents with  . Weakness     (Consider location/radiation/quality/duration/timing/severity/associated sxs/prior Treatment) Patient is a 70 y.o. female presenting with weakness. The history is provided by the patient and a relative.  Weakness Pertinent negatives include no chest pain, no abdominal pain, no headaches and no shortness of breath.  The patient now with 5 episodes of similar problems as per family. Patient gets a feeling as if she's going to pass out she gets weakness in both legs gets kind of a tingling sensation and then she goes into more of a panic attack. Patient gets scared she's afraid something bad Anne-Marie Genson happen. Patient evaluated in April 2016 for this and recently in November 2016. Those workups had no significant findings. Patient feels as if his palpitations the heart feels like she's got pass out but never has passed out. Does not have chest pain. No strokelike symptoms.  Past Medical History  Diagnosis Date  . Hypertension   . MGUS (monoclonal gammopathy of unknown significance) 09/24/2014   Past Surgical History  Procedure Laterality Date  . Eye surgery    . Abdominal hysterectomy    . Breast surgery    . Knee arthroscopy     No family history on file. Social History  Substance Use Topics  . Smoking status: Never Smoker   . Smokeless tobacco: Never Used     Comment: NEVER USED TOBACCO  . Alcohol Use: No   OB History    No data available     Review of Systems  Constitutional: Negative for fever.  HENT: Negative for congestion.   Eyes: Negative for visual disturbance.  Respiratory: Negative for shortness of breath.   Cardiovascular: Negative for chest pain.  Gastrointestinal: Negative for abdominal pain.  Musculoskeletal: Negative for back pain and neck pain.  Skin: Negative for rash.  Neurological:  Positive for dizziness, weakness and light-headedness. Negative for syncope, speech difficulty, numbness and headaches.  Hematological: Does not bruise/bleed easily.  Psychiatric/Behavioral: Negative for confusion.      Allergies  Esomeprazole and Feldene  Home Medications   Prior to Admission medications   Medication Sig Start Date End Date Taking? Authorizing Provider  amLODipine (NORVASC) 10 MG tablet Take 10 mg by mouth daily.    Historical Provider, MD  aspirin 81 MG tablet Take 81 mg by mouth daily.    Historical Provider, MD  calcium & magnesium carbonates (MYLANTA) 311-232 MG per tablet Take 1 tablet by mouth as needed for heartburn.    Historical Provider, MD  diphenhydrAMINE (SOMINEX) 25 MG tablet Take 25 mg by mouth as needed for sleep. allergy    Historical Provider, MD  DUREZOL 0.05 % EMUL PLACE 1 GTT INTO THE LEFT EYE BID 09/14/14   Historical Provider, MD  zolpidem (AMBIEN) 5 MG tablet Take 1 tablet (5 mg total) by mouth at bedtime as needed for sleep. 04/11/15   Volanda Napoleon, MD   BP 146/85 mmHg  Pulse 64  Temp(Src) 98.6 F (37 C) (Oral)  Resp 16  Ht 5\' 6"  (1.676 m)  Wt 82.555 kg  BMI 29.39 kg/m2  SpO2 100% Physical Exam  Constitutional: She is oriented to person, place, and time. She appears well-developed and well-nourished. No distress.  HENT:  Head: Normocephalic and atraumatic.  Mouth/Throat: Oropharynx is clear and moist.  Eyes: EOM are  normal. Pupils are equal, round, and reactive to light.  Neck: Normal range of motion. Neck supple.  Cardiovascular: Normal rate, regular rhythm and normal heart sounds.   No murmur heard. Pulmonary/Chest: Effort normal and breath sounds normal.  Abdominal: Soft. Bowel sounds are normal. There is no tenderness.  Musculoskeletal: Normal range of motion.  Neurological: She is alert and oriented to person, place, and time. No cranial nerve deficit. She exhibits normal muscle tone. Coordination normal.  Skin: Skin is warm.  No rash noted.  Nursing note and vitals reviewed.   ED Course  Procedures (including critical care time) Labs Review Labs Reviewed  CBC WITH DIFFERENTIAL/PLATELET - Abnormal; Notable for the following:    Hemoglobin 11.9 (*)    HCT 35.3 (*)    All other components within normal limits  BASIC METABOLIC PANEL - Abnormal; Notable for the following:    Glucose, Bld 105 (*)    All other components within normal limits  CBG MONITORING, ED   Results for orders placed or performed during the hospital encounter of 05/30/15  CBC with Differential/Platelet  Result Value Ref Range   WBC 5.5 4.0 - 10.5 K/uL   RBC 4.29 3.87 - 5.11 MIL/uL   Hemoglobin 11.9 (L) 12.0 - 15.0 g/dL   HCT 35.3 (L) 36.0 - 46.0 %   MCV 82.3 78.0 - 100.0 fL   MCH 27.7 26.0 - 34.0 pg   MCHC 33.7 30.0 - 36.0 g/dL   RDW 14.9 11.5 - 15.5 %   Platelets 341 150 - 400 K/uL   Neutrophils Relative % 71 %   Neutro Abs 3.9 1.7 - 7.7 K/uL   Lymphocytes Relative 20 %   Lymphs Abs 1.1 0.7 - 4.0 K/uL   Monocytes Relative 7 %   Monocytes Absolute 0.4 0.1 - 1.0 K/uL   Eosinophils Relative 2 %   Eosinophils Absolute 0.1 0.0 - 0.7 K/uL   Basophils Relative 0 %   Basophils Absolute 0.0 0.0 - 0.1 K/uL   WBC Morphology TOXIC GRANULATION    RBC Morphology ELLIPTOCYTES   Basic metabolic panel  Result Value Ref Range   Sodium 141 135 - 145 mmol/L   Potassium 3.5 3.5 - 5.1 mmol/L   Chloride 108 101 - 111 mmol/L   CO2 23 22 - 32 mmol/L   Glucose, Bld 105 (H) 65 - 99 mg/dL   BUN 13 6 - 20 mg/dL   Creatinine, Ser 0.92 0.44 - 1.00 mg/dL   Calcium 9.1 8.9 - 10.3 mg/dL   GFR calc non Af Amer >60 >60 mL/min   GFR calc Af Amer >60 >60 mL/min   Anion gap 10 5 - 15  POC CBG, ED  Result Value Ref Range   Glucose-Capillary 89 65 - 99 mg/dL     Imaging Review No results found. I have personally reviewed and evaluated these images and lab results as part of my medical decision-making.   EKG Interpretation   Date/Time:  Friday  May 30 2015 13:33:00 EST Ventricular Rate:  89 PR Interval:  119 QRS Duration: 96 QT Interval:  366 QTC Calculation: 445 R Axis:   24 Text Interpretation:  Sinus rhythm Borderline short PR interval Abnormal  R-wave progression, early transition LVH with secondary repolarization  abnormality No significant change since last tracing Confirmed by  Avaley Coop  MD, Eleena Grater [phone]) on 05/30/2015 2:02:28 PM      MDM   Final diagnoses:  Near syncope  Palpitations  Panic attack   Patient's symptoms  of near syncope with negative workup here. Patient's had symptoms like this at times since April 2016. Family can account for at least 5 episodes. Patient evaluated now 3 times in the emergency department for this without any specific findings. Patient clearly gets a form of panic when the symptoms come on in particular he occurs when she is by herself she gets fearful that something bad Nicki Reaper happened. Cardiac monitoring here without any arrhythmias. Labs without any significant abnormalities or changes compared to January 2017. Recommend follow-up with her primary care doctor or cardiology. Primary care doctors not aware that these have continued. Would recommend cardiac monitoring at home. Family aware patient feels much better now.     Fredia Sorrow, MD 05/30/15 1550

## 2015-05-30 NOTE — Discharge Instructions (Signed)
Return for any new or worse symptoms. Labs here today without any significant change compared to those in February. Cardiac monitoring here showed no arrhythmia. Recommend make an appointment with your regular doctor or cardiology for cardiac monitoring.

## 2015-06-05 DIAGNOSIS — I10 Essential (primary) hypertension: Secondary | ICD-10-CM | POA: Diagnosis not present

## 2015-06-05 DIAGNOSIS — E7800 Pure hypercholesterolemia, unspecified: Secondary | ICD-10-CM | POA: Diagnosis not present

## 2015-06-09 DIAGNOSIS — F5101 Primary insomnia: Secondary | ICD-10-CM | POA: Diagnosis not present

## 2015-06-09 DIAGNOSIS — Z6831 Body mass index (BMI) 31.0-31.9, adult: Secondary | ICD-10-CM | POA: Diagnosis not present

## 2015-06-09 DIAGNOSIS — I10 Essential (primary) hypertension: Secondary | ICD-10-CM | POA: Diagnosis not present

## 2015-10-10 ENCOUNTER — Encounter: Payer: Self-pay | Admitting: Hematology & Oncology

## 2015-10-10 ENCOUNTER — Other Ambulatory Visit (HOSPITAL_BASED_OUTPATIENT_CLINIC_OR_DEPARTMENT_OTHER): Payer: Medicare Other

## 2015-10-10 ENCOUNTER — Ambulatory Visit (HOSPITAL_BASED_OUTPATIENT_CLINIC_OR_DEPARTMENT_OTHER): Payer: Medicare Other | Admitting: Hematology & Oncology

## 2015-10-10 VITALS — BP 144/75 | HR 74 | Temp 97.7°F | Resp 18 | Ht 66.0 in | Wt 183.0 lb

## 2015-10-10 DIAGNOSIS — D472 Monoclonal gammopathy: Secondary | ICD-10-CM

## 2015-10-10 DIAGNOSIS — Z87898 Personal history of other specified conditions: Secondary | ICD-10-CM | POA: Diagnosis not present

## 2015-10-10 DIAGNOSIS — Z1239 Encounter for other screening for malignant neoplasm of breast: Secondary | ICD-10-CM

## 2015-10-10 LAB — CBC WITH DIFFERENTIAL (CANCER CENTER ONLY)
BASO#: 0 10*3/uL (ref 0.0–0.2)
BASO%: 0.4 % (ref 0.0–2.0)
EOS ABS: 0.5 10*3/uL (ref 0.0–0.5)
EOS%: 9.6 % — ABNORMAL HIGH (ref 0.0–7.0)
HEMATOCRIT: 37 % (ref 34.8–46.6)
HEMOGLOBIN: 12.6 g/dL (ref 11.6–15.9)
LYMPH#: 2 10*3/uL (ref 0.9–3.3)
LYMPH%: 43.5 % (ref 14.0–48.0)
MCH: 28.4 pg (ref 26.0–34.0)
MCHC: 34.1 g/dL (ref 32.0–36.0)
MCV: 83 fL (ref 81–101)
MONO#: 0.4 10*3/uL (ref 0.1–0.9)
MONO%: 7.7 % (ref 0.0–13.0)
NEUT%: 38.8 % — ABNORMAL LOW (ref 39.6–80.0)
NEUTROS ABS: 1.8 10*3/uL (ref 1.5–6.5)
Platelets: 363 10*3/uL (ref 145–400)
RBC: 4.44 10*6/uL (ref 3.70–5.32)
RDW: 14.9 % (ref 11.1–15.7)
WBC: 4.7 10*3/uL (ref 3.9–10.0)

## 2015-10-10 LAB — COMPREHENSIVE METABOLIC PANEL
ALBUMIN: 4.1 g/dL (ref 3.5–5.0)
ALK PHOS: 104 U/L (ref 40–150)
ALT: 20 U/L (ref 0–55)
ANION GAP: 8 meq/L (ref 3–11)
AST: 18 U/L (ref 5–34)
BILIRUBIN TOTAL: 0.8 mg/dL (ref 0.20–1.20)
BUN: 13.6 mg/dL (ref 7.0–26.0)
CO2: 25 mEq/L (ref 22–29)
Calcium: 9.7 mg/dL (ref 8.4–10.4)
Chloride: 105 mEq/L (ref 98–109)
Creatinine: 1 mg/dL (ref 0.6–1.1)
EGFR: 64 mL/min/{1.73_m2} — AB (ref >90)
Glucose: 84 mg/dl (ref 70–140)
Potassium: 4.2 mEq/L (ref 3.5–5.1)
Sodium: 139 mEq/L (ref 136–145)
TOTAL PROTEIN: 9.2 g/dL — AB (ref 6.4–8.3)

## 2015-10-10 NOTE — Progress Notes (Signed)
Hematology and Oncology Follow Up Visit  Kelly Chase [id number] July 05, 1945 70 y.o. 10/10/2015   Principle Diagnosis:   IgG Kappa MGUS  Current Therapy:    Observation     Interim History:  Kelly Chase is back for a follow-up. We last saw her back in January. She is doing okay. She is trying to avoid the hot weather right now.  4 saw her back in [place], there is no monoclonal spike noted in her serum. Her IgG level was 1380 mg/dL. Her serum Kappa Lightchain was 2.5 mg/dL.  She has a having some left hip issues. I think she has had surgery for that hip.  She's had no change in bowel or bladder habits. She's had no cough. She said no rashes.  She's not yet had a mammogram. This really needs to get done.  Overall, her performance status is ECOG 0.     Medications:  Current outpatient prescriptions:  .  amLODipine (NORVASC) 10 MG tablet, Take 10 mg by mouth daily., Disp: , Rfl:  .  aspirin 81 MG tablet, Take 81 mg by mouth daily., Disp: , Rfl:  .  calcium & magnesium carbonates (MYLANTA) 311-232 MG per tablet, Take 1 tablet by mouth as needed for heartburn., Disp: , Rfl:  .  lisinopril (PRINIVIL,ZESTRIL) 40 MG tablet, Take 40 mg by mouth., Disp: , Rfl:  .  zolpidem (AMBIEN) 5 MG tablet, Take 1 tablet (5 mg total) by mouth at bedtime as needed for sleep., Disp: 30 tablet, Rfl: 2  Allergies:  Allergies  Allergen Reactions  . Esomeprazole Other (See Comments)    Joint pain  . Feldene [Piroxicam] Other (See Comments)    abd "burning"    Past Medical History, Surgical history, Social history, and Family History were reviewed and updated.  Review of Systems: As above  Physical Exam:  height is 5\' 6"  (1.676 m) and weight is 183 lb (83.008 kg). Her oral temperature is 97.7 F (36.5 C). Her blood pressure is 144/75 and her pulse is 74. Her respiration is 18.   Wt Readings from Last 3 Encounters:  10/10/15 183 lb (83.008 kg)  05/30/15 182 lb (82.555 kg)  04/11/15 184 lb  (83.462 kg)     Head and neck exam shows no ocular or oral lesion. There are no palpable cervical or supraclavicular lymph nodes. Lungs are clear. Cardiac exam regular rate and rhythm with no murmurs, rubs or bruits. Abdomen is soft. She has good bowel sounds. There is no fluid wave. There is no palpable liver or spleen tip. Back exam shows no tenderness over the spine, ribs or hips. Extremities shows no clubbing, cyanosis or edema. Skin exam shows no rashes. Neurological exam is nonfocal.  Lab Results  Component Value Date   WBC 5.5 05/30/2015   HGB 11.9* 05/30/2015   HCT 35.3* 05/30/2015   MCV 82.3 05/30/2015   PLT 341 05/30/2015     Chemistry      Component Value Date/Time   NA 141 05/30/2015 1415   NA 140 04/11/2015 0953   K 3.5 05/30/2015 1415   K 3.6 04/11/2015 0953   CL 108 05/30/2015 1415   CL 105 04/11/2015 0953   CO2 23 05/30/2015 1415   CO2 27 04/11/2015 0953   BUN 13 05/30/2015 1415   BUN 12 04/11/2015 0953   CREATININE 0.92 05/30/2015 1415   CREATININE 1.0 04/11/2015 0953      Component Value Date/Time   CALCIUM 9.1 05/30/2015 1415   CALCIUM 8.9  04/11/2015 0953   ALKPHOS 76 04/11/2015 0953   ALKPHOS 79 09/24/2014 1035   AST 21 04/11/2015 0953   AST 15 09/24/2014 1035   ALT 24 04/11/2015 0953   ALT 17 09/24/2014 1035   BILITOT 1.00 04/11/2015 0953   BILITOT 0.7 09/24/2014 1035         Impression and Plan: Ms. Kelly Chase is a 70 year old African-American female with IgG Kappa MGUS. I do not believe that she has myeloma. I see nothing with the labs that show myeloma.  I looked at her blood smear. Her blood smear was pretty much benign.  We will see what her 24 hour urine shows. We checked it back in January, there is no monoclonal spike observed.  From my point of view, I really don't think we have to get her back to see [place]. We will see what the labs show but I would think everything is negative, we just or not adding much to her healthcare.   She does  need to have a mammogram done. I will have to set this up.  I spent about 30 minutes with her.   Volanda Napoleon, MD 7/21/201710:18 AM

## 2015-10-11 LAB — IGG, IGA, IGM
IgA, Qn, Serum: 236 mg/dL (ref 64–422)
IgG, Qn, Serum: 2601 mg/dL — ABNORMAL HIGH (ref 700–1600)
IgM, Qn, Serum: 148 mg/dL (ref 26–217)

## 2015-10-11 LAB — BETA 2 MICROGLOBULIN, SERUM: BETA 2: 2.3 mg/L (ref 0.6–2.4)

## 2015-10-12 LAB — CARDIOLIPIN ANTIBODIES, IGG, IGM, IGA
Anticardiolipin Ab,IgG,Qn: <9 GPL U/mL (ref 0–14)
Anticardiolipin Ab,IgM,Qn: <9 MPL U/mL (ref 0–12)

## 2015-10-13 LAB — KAPPA/LAMBDA LIGHT CHAINS
IG KAPPA FREE LIGHT CHAIN: 25.2 mg/L — AB (ref 3.3–19.4)
Ig Lambda Free Light Chain: 16.4 mg/L (ref 5.7–26.3)
Kappa/Lambda FluidC Ratio: 1.54 (ref 0.26–1.65)

## 2015-10-14 LAB — PROTEIN ELECTROPHORESIS, SERUM, WITH REFLEX
A/G RATIO SPE: 0.9 (ref 0.7–1.7)
ALBUMIN: 3.9 g/dL (ref 2.9–4.4)
ALPHA 2: 0.5 g/dL (ref 0.4–1.0)
Alpha 1: 0.2 g/dL (ref 0.0–0.4)
Beta: 1.3 g/dL (ref 0.7–1.3)
GLOBULIN, TOTAL: 4.3 g/dL — AB (ref 2.2–3.9)
Gamma Globulin: 2.3 g/dL — ABNORMAL HIGH (ref 0.4–1.8)
INTERPRETATION(SEE BELOW): 0
M-Spike, %: 1.5 g/dL — ABNORMAL HIGH
Total Protein: 8.2 g/dL (ref 6.0–8.5)

## 2015-10-20 ENCOUNTER — Ambulatory Visit (HOSPITAL_BASED_OUTPATIENT_CLINIC_OR_DEPARTMENT_OTHER)
Admission: RE | Admit: 2015-10-20 | Discharge: 2015-10-20 | Disposition: A | Discharge status: Home / Self Care | Payer: Medicare Other | Source: Ambulatory Visit | Attending: Hematology & Oncology | Admitting: Hematology & Oncology

## 2015-10-20 ENCOUNTER — Ambulatory Visit: Payer: Medicare Other

## 2015-10-20 DIAGNOSIS — Z1231 Encounter for screening mammogram for malignant neoplasm of breast: Secondary | ICD-10-CM

## 2015-10-20 DIAGNOSIS — D472 Monoclonal gammopathy: Secondary | ICD-10-CM | POA: Diagnosis not present

## 2015-10-23 LAB — UIFE/LIGHT CHAINS/TP QN, 24-HR UR
FR KAPPA LT CH,24HR: 28 mg/(24.h)
FR LAMBDA LT CH,24HR: 1 mg/24 hr
FREE LAMBDA LT CHAINS, UR: 0.45 mg/L (ref 0.24–6.66)
Free Kappa Lt Chains,Ur: 14.2 mg/L (ref 1.35–24.19)
KAPPA/LAMBDA RATIO, U: 31.56 — AB (ref 2.04–10.37)
PROTEIN,TOTAL,URINE: 5.4 mg/dL
Prot,24hr calculated: 105 mg/24 hr (ref 30–150)

## 2015-12-10 DIAGNOSIS — Z23 Encounter for immunization: Secondary | ICD-10-CM | POA: Diagnosis not present

## 2015-12-24 DIAGNOSIS — M818 Other osteoporosis without current pathological fracture: Secondary | ICD-10-CM | POA: Diagnosis not present

## 2015-12-24 DIAGNOSIS — Z13820 Encounter for screening for osteoporosis: Secondary | ICD-10-CM | POA: Diagnosis not present

## 2015-12-24 DIAGNOSIS — Z1159 Encounter for screening for other viral diseases: Secondary | ICD-10-CM | POA: Diagnosis not present

## 2015-12-24 DIAGNOSIS — F5101 Primary insomnia: Secondary | ICD-10-CM | POA: Diagnosis not present

## 2015-12-24 DIAGNOSIS — Z1231 Encounter for screening mammogram for malignant neoplasm of breast: Secondary | ICD-10-CM | POA: Diagnosis not present

## 2015-12-24 DIAGNOSIS — Z6831 Body mass index (BMI) 31.0-31.9, adult: Secondary | ICD-10-CM | POA: Diagnosis not present

## 2015-12-24 DIAGNOSIS — I10 Essential (primary) hypertension: Secondary | ICD-10-CM | POA: Diagnosis not present

## 2015-12-24 DIAGNOSIS — F329 Major depressive disorder, single episode, unspecified: Secondary | ICD-10-CM | POA: Diagnosis not present

## 2015-12-28 DIAGNOSIS — E875 Hyperkalemia: Secondary | ICD-10-CM | POA: Diagnosis not present

## 2015-12-28 DIAGNOSIS — I10 Essential (primary) hypertension: Secondary | ICD-10-CM | POA: Diagnosis not present

## 2015-12-28 DIAGNOSIS — R42 Dizziness and giddiness: Secondary | ICD-10-CM | POA: Diagnosis not present

## 2015-12-28 DIAGNOSIS — E876 Hypokalemia: Secondary | ICD-10-CM | POA: Diagnosis not present

## 2015-12-28 DIAGNOSIS — R404 Transient alteration of awareness: Secondary | ICD-10-CM | POA: Diagnosis not present

## 2015-12-28 DIAGNOSIS — Z859 Personal history of malignant neoplasm, unspecified: Secondary | ICD-10-CM | POA: Diagnosis not present

## 2015-12-28 DIAGNOSIS — R002 Palpitations: Secondary | ICD-10-CM | POA: Diagnosis not present

## 2015-12-28 DIAGNOSIS — Z7982 Long term (current) use of aspirin: Secondary | ICD-10-CM | POA: Diagnosis not present

## 2015-12-28 DIAGNOSIS — F419 Anxiety disorder, unspecified: Secondary | ICD-10-CM | POA: Diagnosis not present

## 2015-12-28 DIAGNOSIS — Z8249 Family history of ischemic heart disease and other diseases of the circulatory system: Secondary | ICD-10-CM | POA: Diagnosis not present

## 2015-12-30 DIAGNOSIS — F329 Major depressive disorder, single episode, unspecified: Secondary | ICD-10-CM | POA: Diagnosis not present

## 2015-12-30 DIAGNOSIS — R002 Palpitations: Secondary | ICD-10-CM | POA: Diagnosis not present

## 2015-12-30 DIAGNOSIS — Z09 Encounter for follow-up examination after completed treatment for conditions other than malignant neoplasm: Secondary | ICD-10-CM | POA: Diagnosis not present

## 2015-12-30 DIAGNOSIS — Z6830 Body mass index (BMI) 30.0-30.9, adult: Secondary | ICD-10-CM | POA: Diagnosis not present

## 2015-12-30 DIAGNOSIS — F5101 Primary insomnia: Secondary | ICD-10-CM | POA: Diagnosis not present

## 2015-12-30 DIAGNOSIS — I10 Essential (primary) hypertension: Secondary | ICD-10-CM | POA: Diagnosis not present

## 2016-03-24 DIAGNOSIS — Z6830 Body mass index (BMI) 30.0-30.9, adult: Secondary | ICD-10-CM | POA: Diagnosis not present

## 2016-03-24 DIAGNOSIS — M67919 Unspecified disorder of synovium and tendon, unspecified shoulder: Secondary | ICD-10-CM | POA: Diagnosis not present

## 2016-03-24 DIAGNOSIS — Z13820 Encounter for screening for osteoporosis: Secondary | ICD-10-CM | POA: Diagnosis not present

## 2016-03-24 DIAGNOSIS — F5101 Primary insomnia: Secondary | ICD-10-CM | POA: Diagnosis not present

## 2016-03-24 DIAGNOSIS — M899 Disorder of bone, unspecified: Secondary | ICD-10-CM | POA: Diagnosis not present

## 2016-03-24 DIAGNOSIS — M719 Bursopathy, unspecified: Secondary | ICD-10-CM | POA: Diagnosis not present

## 2016-03-24 DIAGNOSIS — M1712 Unilateral primary osteoarthritis, left knee: Secondary | ICD-10-CM | POA: Diagnosis not present

## 2016-03-24 DIAGNOSIS — I10 Essential (primary) hypertension: Secondary | ICD-10-CM | POA: Diagnosis not present

## 2016-09-23 DIAGNOSIS — R5383 Other fatigue: Secondary | ICD-10-CM | POA: Diagnosis not present

## 2016-09-23 DIAGNOSIS — E860 Dehydration: Secondary | ICD-10-CM | POA: Diagnosis not present

## 2016-09-23 DIAGNOSIS — I10 Essential (primary) hypertension: Secondary | ICD-10-CM | POA: Diagnosis not present

## 2016-09-23 DIAGNOSIS — J301 Allergic rhinitis due to pollen: Secondary | ICD-10-CM | POA: Diagnosis not present

## 2016-11-16 DIAGNOSIS — I10 Essential (primary) hypertension: Secondary | ICD-10-CM | POA: Diagnosis not present

## 2016-11-16 DIAGNOSIS — M25511 Pain in right shoulder: Secondary | ICD-10-CM | POA: Diagnosis not present

## 2016-11-16 DIAGNOSIS — Z6831 Body mass index (BMI) 31.0-31.9, adult: Secondary | ICD-10-CM | POA: Diagnosis not present

## 2016-12-24 DIAGNOSIS — H35033 Hypertensive retinopathy, bilateral: Secondary | ICD-10-CM | POA: Diagnosis not present

## 2016-12-25 DIAGNOSIS — Z23 Encounter for immunization: Secondary | ICD-10-CM | POA: Diagnosis not present

## 2017-06-09 ENCOUNTER — Ambulatory Visit (INDEPENDENT_AMBULATORY_CARE_PROVIDER_SITE_OTHER): Payer: Medicare Other | Admitting: Family Medicine

## 2017-06-09 ENCOUNTER — Encounter: Payer: Self-pay | Admitting: Family Medicine

## 2017-06-09 VITALS — BP 138/80 | HR 60 | Ht 66.0 in | Wt 187.2 lb

## 2017-06-09 DIAGNOSIS — F5101 Primary insomnia: Secondary | ICD-10-CM

## 2017-06-09 DIAGNOSIS — Z0000 Encounter for general adult medical examination without abnormal findings: Secondary | ICD-10-CM | POA: Insufficient documentation

## 2017-06-09 DIAGNOSIS — I10 Essential (primary) hypertension: Secondary | ICD-10-CM

## 2017-06-09 LAB — CBC
HEMATOCRIT: 35.5 % — AB (ref 36.0–46.0)
HEMOGLOBIN: 12 g/dL (ref 12.0–15.0)
MCHC: 33.7 g/dL (ref 30.0–36.0)
MCV: 83.8 fl (ref 78.0–100.0)
PLATELETS: 384 10*3/uL (ref 150.0–400.0)
RBC: 4.24 Mil/uL (ref 3.87–5.11)
RDW: 15.5 % (ref 11.5–15.5)
WBC: 5.3 10*3/uL (ref 4.0–10.5)

## 2017-06-09 LAB — URINALYSIS, ROUTINE W REFLEX MICROSCOPIC
BILIRUBIN URINE: NEGATIVE
HGB URINE DIPSTICK: NEGATIVE
Ketones, ur: NEGATIVE
LEUKOCYTES UA: NEGATIVE
NITRITE: NEGATIVE
RBC / HPF: NONE SEEN (ref 0–?)
Specific Gravity, Urine: <=1.005 — AB (ref 1.000–1.030)
TOTAL PROTEIN, URINE-UPE24: NEGATIVE
Urine Glucose: NEGATIVE
Urobilinogen, UA: 0.2 (ref 0.0–1.0)
pH: 5.5 (ref 5.0–8.0)

## 2017-06-09 MED ORDER — LISINOPRIL 20 MG PO TABS: 20.0000 mg | ORAL_TABLET | Freq: Every day | ORAL | 3 refills | Status: DC

## 2017-06-09 MED ORDER — AMLODIPINE BESYLATE 10 MG PO TABS: 10.0000 mg | ORAL_TABLET | Freq: Every day | ORAL | 1 refills | Status: DC

## 2017-06-09 MED ORDER — TRAZODONE HCL 100 MG PO TABS: ORAL_TABLET | ORAL | 2 refills | Status: DC

## 2017-06-09 NOTE — Patient Instructions (Addendum)
How to Take Your Blood Pressure You can take your blood pressure at home with a machine. You may need to check your blood pressure at home:  To check if you have high blood pressure (hypertension).  To check your blood pressure over time.  To make sure your blood pressure medicine is working.  Supplies needed: You will need a blood pressure machine, or monitor. You can buy one at a drugstore or online. When choosing one:  Choose one with an arm cuff.  Choose one that wraps around your upper arm. Only one finger should fit between your arm and the cuff.  Do not choose one that measures your blood pressure from your wrist or finger.  Your doctor can suggest a monitor. How to prepare Avoid these things for 30 minutes before checking your blood pressure:  Drinking caffeine.  Drinking alcohol.  Eating.  Smoking.  Exercising.  Five minutes before checking your blood pressure:  Pee.  Sit in a dining chair. Avoid sitting in a soft couch or armchair.  Be quiet. Do not talk.  How to take your blood pressure Follow the instructions that came with your machine. If you have a digital blood pressure monitor, these may be the instructions: 1. Sit up straight. 2. Place your feet on the floor. Do not cross your ankles or legs. 3. Rest your left arm at the level of your heart. You may rest it on a table, desk, or chair. 4. Pull up your shirt sleeve. 5. Wrap the blood pressure cuff around the upper part of your left arm. The cuff should be 1 inch (2.5 cm) above your elbow. It is best to wrap the cuff around bare skin. 6. Fit the cuff snugly around your arm. You should be able to place only one finger between the cuff and your arm. 7. Put the cord inside the groove of your elbow. 8. Press the power button. 9. Sit quietly while the cuff fills with air and loses air. 10. Write down the numbers on the screen. 11. Wait 2-3 minutes and then repeat steps 1-10.  What do the numbers  mean? Two numbers make up your blood pressure. The first number is called systolic pressure. The second is called diastolic pressure. An example of a blood pressure reading is "120 over 80" (or 120/80). If you are an adult and do not have a medical condition, use this guide to find out if your blood pressure is normal: Normal  First number: below 120.  Second number: below 80. Elevated  First number: 120-129.  Second number: below 80. Hypertension stage 1  First number: 130-139.  Second number: 80-89. Hypertension stage 2  First number: 140 or above.  Second number: 90 or above. Your blood pressure is above normal even if only the top or bottom number is above normal. Follow these instructions at home:  Check your blood pressure as often as your doctor tells you to.  Take your monitor to your next doctor's appointment. Your doctor will: ? Make sure you are using it correctly. ? Make sure it is working right.  Make sure you understand what your blood pressure numbers should be.  Tell your doctor if your medicines are causing side effects. Contact a doctor if:  Your blood pressure keeps being high. Get help right away if:  Your first blood pressure number is higher than 180.  Your second blood pressure number is higher than 120. This information is not intended to replace advice given   to you by your health care provider. Make sure you discuss any questions you have with your health care provider. Document Released: 02/19/2008 Document Revised: 02/04/2016 Document Reviewed: 08/15/2015 Elsevier Interactive Patient Education  2018 [employer].  Hypertension Hypertension is another name for high blood pressure. High blood pressure forces your heart to work harder to pump blood. This can cause problems over time. There are two numbers in a blood pressure reading. There is a top number (systolic) over a bottom number (diastolic). It is best to have a blood pressure below  120/80. Healthy choices can help lower your blood pressure. You may need medicine to help lower your blood pressure if:  Your blood pressure cannot be lowered with healthy choices.  Your blood pressure is higher than 130/80.  Follow these instructions at home: Eating and drinking  If directed, follow the DASH eating plan. This diet includes: ? Filling half of your plate at each meal with fruits and vegetables. ? Filling one quarter of your plate at each meal with whole grains. Whole grains include whole wheat pasta, brown rice, and whole grain bread. ? Eating or drinking low-fat dairy products, such as skim milk or low-fat yogurt. ? Filling one quarter of your plate at each meal with low-fat (lean) proteins. Low-fat proteins include fish, skinless chicken, eggs, beans, and tofu. ? Avoiding fatty meat, cured and processed meat, or chicken with skin. ? Avoiding premade or processed food.  Eat less than 1,500 mg of salt (sodium) a day.  Limit alcohol use to no more than 1 drink a day for nonpregnant women and 2 drinks a day for men. One drink equals 12 oz of beer, 5 oz of wine, or 1 oz of hard liquor. Lifestyle  Work with your doctor to stay at a healthy weight or to lose weight. Ask your doctor what the best weight is for you.  Get at least 30 minutes of exercise that causes your heart to beat faster (aerobic exercise) most days of the week. This may include walking, swimming, or biking.  Get at least 30 minutes of exercise that strengthens your muscles (resistance exercise) at least 3 days a week. This may include lifting weights or pilates.  Do not use any products that contain nicotine or tobacco. This includes cigarettes and e-cigarettes. If you need help quitting, ask your doctor.  Check your blood pressure at home as told by your doctor.  Keep all follow-up visits as told by your doctor. This is important. Medicines  Take over-the-counter and prescription medicines only as  told by your doctor. Follow directions carefully.  Do not skip doses of blood pressure medicine. The medicine does not work as well if you skip doses. Skipping doses also puts you at risk for problems.  Ask your doctor about side effects or reactions to medicines that you should watch for. Contact a doctor if:  You think you are having a reaction to the medicine you are taking.  You have headaches that keep coming back (recurring).  You feel dizzy.  You have swelling in your ankles.  You have trouble with your vision. Get help right away if:  You get a very bad headache.  You start to feel confused.  You feel weak or numb.  You feel faint.  You get very bad pain in your: ? Chest. ? Belly (abdomen).  You throw up (vomit) more than once.  You have trouble breathing. Summary  Hypertension is another name for high blood pressure.  Making healthy choices can help lower blood pressure. If your blood pressure cannot be controlled with healthy choices, you may need to take medicine. This information is not intended to replace advice given to you by your health care provider. Make sure you discuss any questions you have with your health care provider. Document Released: 08/25/2007 Document Revised: 02/04/2016 Document Reviewed: 02/04/2016 Elsevier Interactive Patient Education  2018 [employer] in the Home Falls can cause injuries. They can happen to people of all ages. There are many things you can do to make your home safe and to help prevent falls. What can I do on the outside of my home?  Regularly fix the edges of walkways and driveways and fix any cracks.  Remove anything that might make you trip as you walk through a door, such as a raised step or threshold.  Trim any bushes or trees on the path to your home.  Use bright outdoor lighting.  Clear any walking paths of anything that might make someone trip, such as rocks or tools.  Regularly  check to see if handrails are loose or broken. Make sure that both sides of any steps have handrails.  Any raised decks and porches should have guardrails on the edges.  Have any leaves, snow, or ice cleared regularly.  Use sand or salt on walking paths during winter.  Clean up any spills in your garage right away. This includes oil or grease spills. What can I do in the bathroom?  Use night lights.  Install grab bars by the toilet and in the tub and shower. Do not use towel bars as grab bars.  Use non-skid mats or decals in the tub or shower.  If you need to sit down in the shower, use a plastic, non-slip stool.  Keep the floor dry. Clean up any water that spills on the floor as soon as it happens.  Remove soap buildup in the tub or shower regularly.  Attach bath mats securely with double-sided non-slip rug tape.  Do not have throw rugs and other things on the floor that can make you trip. What can I do in the bedroom?  Use night lights.  Make sure that you have a light by your bed that is easy to reach.  Do not use any sheets or blankets that are too big for your bed. They should not hang down onto the floor.  Have a firm chair that has side arms. You can use this for support while you get dressed.  Do not have throw rugs and other things on the floor that can make you trip. What can I do in the kitchen?  Clean up any spills right away.  Avoid walking on wet floors.  Keep items that you use a lot in easy-to-reach places.  If you need to reach something above you, use a strong step stool that has a grab bar.  Keep electrical cords out of the way.  Do not use floor polish or wax that makes floors slippery. If you must use wax, use non-skid floor wax.  Do not have throw rugs and other things on the floor that can make you trip. What can I do with my stairs?  Do not leave any items on the stairs.  Make sure that there are handrails on both sides of the stairs and  use them. Fix handrails that are broken or loose. Make sure that handrails are as long as the stairways.  Check  any carpeting to make sure that it is firmly attached to the stairs. Fix any carpet that is loose or worn.  Avoid having throw rugs at the top or bottom of the stairs. If you do have throw rugs, attach them to the floor with carpet tape.  Make sure that you have a light switch at the top of the stairs and the bottom of the stairs. If you do not have them, ask someone to add them for you. What else can I do to help prevent falls?  Wear shoes that: ? Do not have high heels. ? Have rubber bottoms. ? Are comfortable and fit you well. ? Are closed at the toe. Do not wear sandals.  If you use a stepladder: ? Make sure that it is fully opened. Do not climb a closed stepladder. ? Make sure that both sides of the stepladder are locked into place. ? Ask someone to hold it for you, if possible.  Clearly mark and make sure that you can see: ? Any grab bars or handrails. ? First and last steps. ? Where the edge of each step is.  Use tools that help you move around (mobility aids) if they are needed. These include: ? Canes. ? Walkers. ? Scooters. ? Crutches.  Turn on the lights when you go into a dark area. Replace any light bulbs as soon as they burn out.  Set up your furniture so you have a clear path. Avoid moving your furniture around.  If any of your floors are uneven, fix them.  If there are any pets around you, be aware of where they are.  Review your medicines with your doctor. Some medicines can make you feel dizzy. This can increase your chance of falling. Ask your doctor what other things that you can do to help prevent falls. This information is not intended to replace advice given to you by your health care provider. Make sure you discuss any questions you have with your health care provider. Document Released: 01/02/2009 Document Revised: 08/14/2015 Document  Reviewed: 04/12/2014 Elsevier Interactive Patient Education  [employer].

## 2017-06-09 NOTE — Progress Notes (Signed)
Subjective:  Patient ID: Kelly Chase, female    DOB: Sep 19, 1945  Age: 72 y.o. MRN: [id number]  CC: Establish Care   HPI Kelly Chase presents for follow-up of hypertension.  She is well-known to me.  She brings in blood pressures in the 115-128/57 range.to 75 she has experienced some lightheadedness with prolonged walking and standing.  She is otherwise not having any problems with her blood pressure medicine.  Continues to have sleep problems.  She does have a TV in her bedroom.  Melatonin does not seem to help.  Ambien has worked for her in the past but has been discontinued due to potential side effects.  She has tried trazodone in the past with some relief.  Continues to live alone.  One son lives in [place] and the other son lives in [place].  She does walk for exercise.  She did fall back in November because she had slipped on some lost.  She does have regular eye checks.  Outpatient Medications Prior to Visit  Medication Sig Dispense Refill  . alum & mag hydroxide-simeth (MAALOX/MYLANTA) 200-200-20 MG/5ML suspension Take by mouth as needed for indigestion or heartburn.    Marland Kitchen amLODipine (NORVASC) 10 MG tablet Take 10 mg by mouth daily.    Marland Kitchen lisinopril (PRINIVIL,ZESTRIL) 30 MG tablet Take 1 tablet by mouth daily.  1  . aspirin 81 MG tablet Take 81 mg by mouth daily.    . calcium & magnesium carbonates (MYLANTA) 311-232 MG per tablet Take 1 tablet by mouth as needed for heartburn.    Marland Kitchen lisinopril (PRINIVIL,ZESTRIL) 40 MG tablet Take 40 mg by mouth.    . zolpidem (AMBIEN) 5 MG tablet Take 1 tablet (5 mg total) by mouth at bedtime as needed for sleep. 30 tablet 2   No facility-administered medications prior to visit.     ROS Review of Systems  Constitutional: Negative.   HENT: Negative.   Eyes: Negative.   Respiratory: Negative.   Cardiovascular: Negative.   Gastrointestinal: Negative.   Endocrine: Negative for polyphagia and polyuria.  Genitourinary: Negative.     Musculoskeletal: Negative for arthralgias and neck stiffness.  Skin: Negative.   Allergic/Immunologic: Negative for immunocompromised state.  Neurological: Negative for weakness and headaches.  Hematological: Does not bruise/bleed easily.  Psychiatric/Behavioral: Negative.     Objective:  BP 138/80 (BP Location: Left Arm, Patient Position: Sitting, Cuff Size: Normal)   Pulse 60   Ht 5\' 6"  (1.676 m)   Wt 187 lb 4 oz (84.9 kg)   SpO2 100%   BMI 30.22 kg/m   BP Readings from Last 3 Encounters:  06/09/17 138/80  10/10/15 (!) 144/75  05/30/15 146/85    Wt Readings from Last 3 Encounters:  06/09/17 187 lb 4 oz (84.9 kg)  10/10/15 183 lb (83 kg)  05/30/15 182 lb (82.6 kg)    Physical Exam  Constitutional: She is oriented to person, place, and time. She appears well-developed and well-nourished. No distress.  HENT:  Head: Normocephalic and atraumatic.  Right Ear: External ear normal.  Left Ear: External ear normal.  Eyes: Pupils are equal, round, and reactive to light. Conjunctivae are normal. Right eye exhibits no discharge. Left eye exhibits no discharge. No scleral icterus.  Neck: Neck supple. No JVD present. No tracheal deviation present. No thyromegaly present.  Cardiovascular: Normal rate, regular rhythm and normal heart sounds.  Pulmonary/Chest: Effort normal and breath sounds normal. No stridor.  Lymphadenopathy:    She has no cervical adenopathy.  Neurological: She  is alert and oriented to person, place, and time.  Skin: Skin is warm and dry. She is not diaphoretic.  Psychiatric: She has a normal mood and affect. Her behavior is normal.    Lab Results  Component Value Date   WBC 4.7 10/10/2015   HGB 12.6 10/10/2015   HCT 37.0 10/10/2015   PLT 363 10/10/2015   GLUCOSE 84 10/10/2015   ALT 20 10/10/2015   AST 18 10/10/2015   NA 139 10/10/2015   K 4.2 10/10/2015   CL 108 05/30/2015   CREATININE 1.0 10/10/2015   BUN 13.6 10/10/2015   CO2 25 10/10/2015   TSH  2.451 06/23/2014    Mm Digital Screening Bilateral  Result Date: 10/20/2015 CLINICAL DATA:  Screening. EXAM: DIGITAL SCREENING BILATERAL MAMMOGRAM WITH CAD COMPARISON:  Previous exam(s). ACR Breast Density Category b: There are scattered areas of fibroglandular density. FINDINGS: There are no findings suspicious for malignancy. Images were processed with CAD. IMPRESSION: No mammographic evidence of malignancy. A result letter of this screening mammogram will be mailed directly to the patient. RECOMMENDATION: Screening mammogram in one year. (Code:SM-B-01Y) BI-RADS CATEGORY  1: Negative. Electronically Signed   By: Ammie Ferrier M.D.   On: 10/21/2015 16:06    Assessment & Plan:   Kelly Chase was seen today for establish care.  Diagnoses and all orders for this visit:  Health care maintenance -     Lipid panel  Essential hypertension -     lisinopril (PRINIVIL,ZESTRIL) 20 MG tablet; Take 1 tablet (20 mg total) by mouth daily. -     amLODipine (NORVASC) 10 MG tablet; Take 1 tablet (10 mg total) by mouth daily. -     CBC -     Comprehensive metabolic panel -     Urinalysis, Routine w reflex microscopic  Primary insomnia -     traZODone (DESYREL) 100 MG tablet; Take 1/2 to 1 at night for sleep as needed.   I have discontinued Kelly Chase aspirin, calcium & magnesium carbonates, zolpidem, lisinopril, and lisinopril. I have also changed her amLODipine. Additionally, I am having her start on lisinopril and traZODone. Lastly, I am having her maintain her alum & mag hydroxide-simeth.  Meds ordered this encounter  Medications  . lisinopril (PRINIVIL,ZESTRIL) 20 MG tablet    Sig: Take 1 tablet (20 mg total) by mouth daily.    Dispense:  90 tablet    Refill:  3  . amLODipine (NORVASC) 10 MG tablet    Sig: Take 1 tablet (10 mg total) by mouth daily.    Dispense:  100 tablet    Refill:  1  . traZODone (DESYREL) 100 MG tablet    Sig: Take 1/2 to 1 at night for sleep as needed.     Dispense:  30 tablet    Refill:  2   Have.  He decreased her lisinopril to 20 mg daily.  Have asked her to DC her aspirin.  We will allow her to take a higher dose of trazodone.  She will follow-up in 3 months.  She will continue to check and record her blood pressures on the lower dose of lisinopril.  Follow-up: Return in about 3 months (around 09/09/2017).  Libby Maw, MD

## 2017-06-10 LAB — LIPID PANEL
Cholesterol: 200 mg/dL (ref 0–200)
HDL: 49.8 mg/dL (ref >39.00)
LDL Cholesterol: 127 mg/dL — ABNORMAL HIGH (ref 0–99)
NONHDL: 149.77
TRIGLYCERIDES: 114 mg/dL (ref 0.0–149.0)
Total CHOL/HDL Ratio: 4
VLDL: 22.8 mg/dL (ref 0.0–40.0)

## 2017-06-10 LAB — COMPREHENSIVE METABOLIC PANEL
ALK PHOS: 89 U/L (ref 39–117)
ALT: 17 U/L (ref 0–35)
AST: 17 U/L (ref 0–37)
Albumin: 4.3 g/dL (ref 3.5–5.2)
BUN: 16 mg/dL (ref 6–23)
CALCIUM: 10.2 mg/dL (ref 8.4–10.5)
CO2: 24 mEq/L (ref 19–32)
Chloride: 105 mEq/L (ref 96–112)
Creatinine, Ser: 1.06 mg/dL (ref 0.40–1.20)
GFR: 65.35 mL/min (ref >60.00)
GLUCOSE: 86 mg/dL (ref 70–99)
POTASSIUM: 5.1 meq/L (ref 3.5–5.1)
Sodium: 139 mEq/L (ref 135–145)
Total Bilirubin: 0.6 mg/dL (ref 0.2–1.2)
Total Protein: 8.9 g/dL — ABNORMAL HIGH (ref 6.0–8.3)

## 2017-06-27 ENCOUNTER — Encounter: Payer: Self-pay | Admitting: Family Medicine

## 2017-06-27 ENCOUNTER — Ambulatory Visit (INDEPENDENT_AMBULATORY_CARE_PROVIDER_SITE_OTHER): Payer: Medicare Other | Admitting: Family Medicine

## 2017-06-27 VITALS — BP 142/86 | HR 94 | Ht 66.0 in | Wt 190.4 lb

## 2017-06-27 DIAGNOSIS — S76911A Strain of unspecified muscles, fascia and tendons at thigh level, right thigh, initial encounter: Secondary | ICD-10-CM | POA: Diagnosis not present

## 2017-06-27 DIAGNOSIS — I10 Essential (primary) hypertension: Secondary | ICD-10-CM

## 2017-06-27 MED ORDER — MELOXICAM 15 MG PO TABS: 15.0000 mg | ORAL_TABLET | Freq: Every day | ORAL | 0 refills | Status: DC

## 2017-06-27 MED ORDER — AMLODIPINE BESYLATE 10 MG PO TABS: 10.0000 mg | ORAL_TABLET | Freq: Every day | ORAL | 1 refills | Status: DC

## 2017-06-27 MED ORDER — CYCLOBENZAPRINE HCL 10 MG PO TABS: 10.0000 mg | ORAL_TABLET | Freq: Every day | ORAL | 0 refills | Status: DC

## 2017-06-27 NOTE — Patient Instructions (Signed)
Muscle Strain A muscle strain is an injury that occurs when a muscle is stretched beyond its normal length. Usually a small number of muscle fibers are torn when this happens. Muscle strain is rated in degrees. First-degree strains have the least amount of muscle fiber tearing and pain. Second-degree and third-degree strains have increasingly more tearing and pain. Usually, recovery from muscle strain takes 1-2 weeks. Complete healing takes 5-6 weeks. What are the causes? Muscle strain happens when a sudden, violent force placed on a muscle stretches it too far. This may occur with lifting, sports, or a fall. What increases the risk? Muscle strain is especially common in athletes. What are the signs or symptoms? At the site of the muscle strain, there may be:  Pain.  Bruising.  Swelling.  Difficulty using the muscle due to pain or lack of normal function.  How is this diagnosed? Your health care provider will perform a physical exam and ask about your medical history. How is this treated? Often, the best treatment for a muscle strain is resting, icing, and applying cold compresses to the injured area. Follow these instructions at home:  Use the PRICE method of treatment to promote muscle healing during the first 2-3 days after your injury. The PRICE method involves: ? Protecting the muscle from being injured again. ? Restricting your activity and resting the injured body part. ? Icing your injury. To do this, put ice in a plastic bag. Place a towel between your skin and the bag. Then, apply the ice and leave it on from 15-20 minutes each hour. After the third day, switch to moist heat packs. ? Apply compression to the injured area with a splint or elastic bandage. Be careful not to wrap it too tightly. This may interfere with blood circulation or increase swelling. ? Elevate the injured body part above the level of your heart as often as you can.  Only take over-the-counter or  prescription medicines for pain, discomfort, or fever as directed by your health care provider.  Warming up prior to exercise helps to prevent future muscle strains. Contact a health care provider if:  You have increasing pain or swelling in the injured area.  You have numbness, tingling, or a significant loss of strength in the injured area. This information is not intended to replace advice given to you by your health care provider. Make sure you discuss any questions you have with your health care provider. Document Released: 03/08/2005 Document Revised: 08/14/2015 Document Reviewed: 10/05/2012 Elsevier Interactive Patient Education  2017 [employer]  

## 2017-06-27 NOTE — Progress Notes (Signed)
Subjective:  Patient ID: Kelly Chase, female    DOB: [birth year]  Age: 72 y.o. MRN: [id number]  CC: leg/muscle pain   HPI Kahlan Engebretson presents for evaluation of right knee pain.  She believes that she injured her thigh exercising at home and walking around her neighborhood.  She feels muscle spasms in this area.  There is no numbness tingling or weakness.  There is no back pain.  She has tried Tylenol and icy hot.  Muscle spasms are worse at night and they are having a tendency to keep her up.  She will need a refill on her amlodipine.  This drug is worked well for her and has controlled her blood pressure adequately.  Outpatient Medications Prior to Visit  Medication Sig Dispense Refill  . alum & mag hydroxide-simeth (MAALOX/MYLANTA) 200-200-20 MG/5ML suspension Take by mouth as needed for indigestion or heartburn.    Marland Kitchen lisinopril (PRINIVIL,ZESTRIL) 20 MG tablet Take 1 tablet (20 mg total) by mouth daily. 90 tablet 3  . traZODone (DESYREL) 100 MG tablet Take 1/2 to 1 at night for sleep as needed. 30 tablet 2  . amLODipine (NORVASC) 10 MG tablet Take 1 tablet (10 mg total) by mouth daily. 100 tablet 1   No facility-administered medications prior to visit.     ROS Review of Systems  Constitutional: Negative.   HENT: Negative.   Eyes: Negative.   Respiratory: Negative.   Cardiovascular: Negative.   Gastrointestinal: Negative.   Endocrine: Negative for polyphagia and polyuria.  Musculoskeletal: Positive for myalgias. Negative for back pain, gait problem and joint swelling.  Skin: Negative.   Allergic/Immunologic: Negative for immunocompromised state.  Neurological: Negative for weakness and numbness.  Hematological: Does not bruise/bleed easily.  Psychiatric/Behavioral: Negative.     Objective:  BP (!) 142/86 (BP Location: Left Arm, Patient Position: Sitting, Cuff Size: Normal)   Pulse 94   Ht 5\' 6"  (1.676 m)   Wt 190 lb 6 oz (86.4 kg)   SpO2 100%   BMI 30.73 kg/m   BP  Readings from Last 3 Encounters:  06/27/17 (!) 142/86  06/09/17 138/80  10/10/15 (!) 144/75    Wt Readings from Last 3 Encounters:  06/27/17 190 lb 6 oz (86.4 kg)  06/09/17 187 lb 4 oz (84.9 kg)  10/10/15 183 lb (83 kg)    Physical Exam  Constitutional: She appears well-developed and well-nourished. No distress.  HENT:  Head: Normocephalic.  Right Ear: External ear normal.  Left Ear: External ear normal.  Nose: Nose normal.  Mouth/Throat: Oropharynx is clear and moist. No oropharyngeal exudate.  Eyes: Pupils are equal, round, and reactive to light. Conjunctivae are normal. Right eye exhibits no discharge. Left eye exhibits no discharge. No scleral icterus.  Neck: No JVD present. No tracheal deviation present.  Pulmonary/Chest: Effort normal.  Musculoskeletal:       Right hip: She exhibits normal range of motion, normal strength and no tenderness.       Left hip: She exhibits normal range of motion, normal strength and no tenderness.       Lumbar back: She exhibits normal range of motion, no tenderness and no bony tenderness.       Legs: Neurological: She has normal strength. She displays no atrophy and no tremor. She exhibits normal muscle tone.  Reflex Scores:      Patellar reflexes are 2+ on the right side and 2+ on the left side.      Achilles reflexes are 1+ on the right  side and 1+ on the left side. Neg dural tension signs.   Skin: She is not diaphoretic.    Lab Results  Component Value Date   WBC 5.3 06/09/2017   HGB 12.0 06/09/2017   HCT 35.5 (L) 06/09/2017   PLT 384.0 06/09/2017   GLUCOSE 86 06/09/2017   CHOL 200 06/09/2017   TRIG 114.0 06/09/2017   HDL 49.80 06/09/2017   LDLCALC 127 (H) 06/09/2017   ALT 17 06/09/2017   AST 17 06/09/2017   NA 139 06/09/2017   K 5.1 06/09/2017   CL 105 06/09/2017   CREATININE 1.06 06/09/2017   BUN 16 06/09/2017   CO2 24 06/09/2017   TSH 2.451 06/23/2014    Mm Digital Screening Bilateral  Result Date:  10/20/2015 CLINICAL DATA:  Screening. EXAM: DIGITAL SCREENING BILATERAL MAMMOGRAM WITH CAD COMPARISON:  Previous exam(s). ACR Breast Density Category b: There are scattered areas of fibroglandular density. FINDINGS: There are no findings suspicious for malignancy. Images were processed with CAD. IMPRESSION: No mammographic evidence of malignancy. A result letter of this screening mammogram will be mailed directly to the patient. RECOMMENDATION: Screening mammogram in one year. (Code:SM-B-01Y) BI-RADS CATEGORY  1: Negative. Electronically Signed   By: Ammie Ferrier M.D.   On: 10/21/2015 16:06    Assessment & Plan:   Addison was seen today for leg/muscle pain.  Diagnoses and all orders for this visit:  Muscle strain of right thigh, initial encounter -     meloxicam (MOBIC) 15 MG tablet; Take 1 tablet (15 mg total) by mouth daily. With food. -     cyclobenzaprine (FLEXERIL) 10 MG tablet; Take 1 tablet (10 mg total) by mouth at bedtime. As needed for spasms.  Essential hypertension -     amLODipine (NORVASC) 10 MG tablet; Take 1 tablet (10 mg total) by mouth daily.   I am having Drue Stager start on meloxicam and cyclobenzaprine. I am also having her maintain her alum & mag hydroxide-simeth, lisinopril, traZODone, and amLODipine.  Meds ordered this encounter  Medications  . meloxicam (MOBIC) 15 MG tablet    Sig: Take 1 tablet (15 mg total) by mouth daily. With food.    Dispense:  15 tablet    Refill:  0  . cyclobenzaprine (FLEXERIL) 10 MG tablet    Sig: Take 1 tablet (10 mg total) by mouth at bedtime. As needed for spasms.    Dispense:  30 tablet    Refill:  0  . amLODipine (NORVASC) 10 MG tablet    Sig: Take 1 tablet (10 mg total) by mouth daily.    Dispense:  100 tablet    Refill:  1   She will have a brief trial of meloxicam.  She will stop it if it upsets her stomach.  Advised her to take it with food.  Flexeril is for nighttime use only.  Refilled her amlodipine for  her.  Follow-up: Return in about 2 weeks (around 07/11/2017), or if symptoms worsen or fail to improve.  Libby Maw, MD

## 2017-06-30 ENCOUNTER — Telehealth: Payer: Self-pay | Admitting: Family Medicine

## 2017-06-30 MED ORDER — CYCLOBENZAPRINE HCL 5 MG PO TABS: 5.0000 mg | ORAL_TABLET | Freq: Every day | ORAL | 0 refills | Status: DC

## 2017-06-30 NOTE — Telephone Encounter (Signed)
Okay to change to 5 mg tablet?

## 2017-06-30 NOTE — Telephone Encounter (Signed)
Copied from [place]. Topic: Quick Communication - See Telephone Encounter >> Jun 30, 2017  9:12 AM Synthia Innocent wrote: CRM for notification. See Telephone encounter for: 06/30/17. Requesting dose change of cyclobenzaprine (FLEXERIL) 10 MG tablet, would like to try 5mg  instead. Please advise. [facility] in [address] [place]

## 2017-06-30 NOTE — Telephone Encounter (Signed)
Yes

## 2017-06-30 NOTE — Telephone Encounter (Signed)
Rx sent in

## 2017-07-02 ENCOUNTER — Other Ambulatory Visit: Payer: Self-pay

## 2017-07-02 ENCOUNTER — Encounter (HOSPITAL_BASED_OUTPATIENT_CLINIC_OR_DEPARTMENT_OTHER): Payer: Self-pay | Admitting: Emergency Medicine

## 2017-07-02 ENCOUNTER — Emergency Department (HOSPITAL_BASED_OUTPATIENT_CLINIC_OR_DEPARTMENT_OTHER)
Admission: EM | Admit: 2017-07-02 | Discharge: 2017-07-02 | Disposition: A | Discharge status: Home / Self Care | Payer: Medicare Other | Attending: Emergency Medicine | Admitting: Emergency Medicine

## 2017-07-02 DIAGNOSIS — Z79899 Other long term (current) drug therapy: Secondary | ICD-10-CM | POA: Diagnosis not present

## 2017-07-02 DIAGNOSIS — M79651 Pain in right thigh: Secondary | ICD-10-CM | POA: Diagnosis not present

## 2017-07-02 DIAGNOSIS — I10 Essential (primary) hypertension: Secondary | ICD-10-CM | POA: Diagnosis not present

## 2017-07-02 MED ORDER — DEXAMETHASONE SODIUM PHOSPHATE 10 MG/ML IJ SOLN
10.0000 mg | Freq: Once | INTRAMUSCULAR | Status: AC
  Administered 2017-07-02: 10 mg via INTRAMUSCULAR
  Filled 2017-07-02: qty 1

## 2017-07-02 NOTE — ED Provider Notes (Addendum)
[facility] DEPART[facility] Provider Note   CSN: [id number] Arrival date & time: 07/02/17  1445     History   Chief Complaint Chief Complaint  Patient presents with  . Leg Pain    HPI Kelly Chase is a 72 y.o. female.  HPI   Kelly Chase is a 72 year old female with a history of hypertension and sciatica who presents to the emergency department for evaluation of right posterior thigh pain.  Patient reports that pain began a week ago.  Pain starts below her right buttocks and ends behind her right knee.  She states that pain feels "sore, aching and tight." Worse with sitting. She reports that she has no pain when she is walking. Denies recent injury, fall or heavy lifting.  States that she was seen by her PCP about a week ago who prescribed her a muscle relaxer and NSAIDs.  Despite taking these medications she continues to have pain.  She reports pain is similar to when she was diagnosed with sciatica in the past. States she had a steroid shot and had complete resolution of her symptoms.  She is requesting a steroid shot today.  She denies leg swelling.  Denies history of DVT/PE, recent surgery or immobility, exogenous estrogen.  She denies fever, chills, back pain, numbness, weakness, loss of bowel or bladder control, urinary frequency, dysuria, hematuria, abdominal pain, nausea/vomiting. She is able to ambulate independently without difficulty.   Past Medical History:  Diagnosis Date  . Hypertension   . 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 HTN (hypertension) 06/09/2017  . Health care maintenance 06/09/2017  . Primary insomnia 06/09/2017  . 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 ABDOMINAL HYSTERECTOMY    . BREAST SURGERY    . EYE SURGERY    . KNEE ARTHROSCOPY       OB History   None      Home  Medications    Prior to Admission medications   Medication Sig Start Date End Date Taking? Authorizing Provider  alum & mag hydroxide-simeth (MAALOX/MYLANTA) 200-200-20 MG/5ML suspension Take by mouth as needed for indigestion or heartburn.    [provider]  amLODipine (NORVASC) 10 MG tablet Take 1 tablet (10 mg total) by mouth daily. 06/27/17   Libby Maw, MD  cyclobenzaprine (FLEXERIL) 5 MG tablet Take 1 tablet (5 mg total) by mouth daily. 06/30/17   Libby Maw, MD  lisinopril (PRINIVIL,ZESTRIL) 20 MG tablet Take 1 tablet (20 mg total) by mouth daily. 06/09/17   Libby Maw, MD  meloxicam (MOBIC) 15 MG tablet Take 1 tablet (15 mg total) by mouth daily. With food. 06/27/17   Libby Maw, MD  traZODone (DESYREL) 100 MG tablet Take 1/2 to 1 at night for sleep as needed. 06/09/17   Libby Maw, MD    Family History No family history on file.  Social History Social History   Tobacco Use  . Smoking status: Never Smoker  . Smokeless tobacco: Never Used  . Tobacco comment: NEVER USED TOBACCO  Substance Use Topics  . Alcohol use: No    Alcohol/week: 0.0 oz  . Drug use: No     Allergies   Celecoxib; Esomeprazole; Feldene [piroxicam]; and Chlorthalidone   Review of Systems Review of Systems  Constitutional: Negative for chills, fatigue, fever and unexpected weight change.  Eyes: Negative for visual disturbance.  Respiratory: Negative for shortness of breath.   Cardiovascular: Negative for chest pain.  Gastrointestinal: Negative for abdominal pain.  Genitourinary: Negative for difficulty urinating, dysuria, frequency and hematuria.  Musculoskeletal: Positive for myalgias (posterior thigh pain). Negative for back pain, gait problem and joint swelling.  Skin: Negative for rash.  Neurological: Negative for weakness and numbness.  Psychiatric/Behavioral: Negative for agitation.     Physical Exam Updated Vital Signs BP  (!) 148/75 (BP Location: Left Arm)   Pulse 77   Temp 98.1 F (36.7 C) (Oral)   Resp 18   Ht 5\' 6"  (1.676 m)   Wt 86.2 kg (190 lb)   SpO2 100%   BMI 30.67 kg/m   Physical Exam  Constitutional: She is oriented to person, place, and time. She appears well-developed and well-nourished. No distress.  HENT:  Head: Normocephalic and atraumatic.  Mouth/Throat: Oropharynx is clear and moist. No oropharyngeal exudate.  Eyes: Pupils are equal, round, and reactive to light. Right eye exhibits no discharge. Left eye exhibits no discharge.  Neck: Normal range of motion. Neck supple.  Cardiovascular: Normal rate, regular rhythm and intact distal pulses. Exam reveals no friction rub.  No murmur heard. Pulmonary/Chest: Effort normal and breath sounds normal. No respiratory distress.  Abdominal: Soft. Bowel sounds are normal. There is no tenderness.  Musculoskeletal:  Right posterior thigh non-tender to palpation. No tenderness over right hip or knee joint. No rash, erythema or ecchymosis noted on skin. No leg swelling appreciated. Full flexion/extension of right hip and flexion/extension of right knee without difficulty. Strength 5/5 in bilateral LE including hip flexion/extension, knee flexion/extension and ankle dorsiflexion/plantar flexion. DP pulses 2+ bilaterally.   Neurological: She is alert and oriented to person, place, and time. Coordination normal.  Distal sensation to light touch intact in bilateral LE. Patellar reflex 2+ bilaterally. Gait normal in coordination and balance.   Skin: Skin is warm and dry. Capillary refill takes less than 2 seconds. She is not diaphoretic.  Psychiatric: She has a normal mood and affect. Her behavior is normal.  Nursing note and vitals reviewed.    ED Treatments / Results  Labs (all labs ordered are listed, but only abnormal results are displayed) Labs Reviewed - No data to display  EKG None  Radiology No results found.  Procedures Procedures  (including critical care time)  Medications Ordered in ED Medications  dexamethasone (DECADRON) injection 10 mg (10 mg Intramuscular Given 07/02/17 1520)     Initial Impression / Assessment and Plan / ED Course  I have reviewed the triage vital signs and the nursing notes.  Pertinent labs & imaging results that were available during my care of the patient were reviewed by me and considered in my medical decision making (see chart for details).     Patient presents with right posterior thigh pain.  Denies recent injury. She reports symptoms are similar to when she was diagnosed with sciatica in the past and is requesting steroid shot in the ED.  On exam, right lower extremity neurovascularly intact.  No erythema, warmth, rash or signs of infection on the skin.  She denies back pain, no loss of bowel or bladder control, no neurological deficits. Presentation not concerning for cauda equina. No pain over the hip, thigh or knee to suggest acute injury indicating need for Xray. No leg swelling or calf tenderness to suggest DVT. Will treat as sciatica pain given patient's history of similar and improvement with steroid. IM decadron given.  Patient counseled to follow up with her PCP if symptoms are not improving. Her blood pressure was also mildly elevated in the ED, counseled her to have this rechecked by her PCP. Discussed return precautions and she agrees. This was a shared patient visit with Dr. Laverta Baltimore who also saw the patient and agrees with plan and discharge home.    Final Clinical Impressions(s) / ED Diagnoses   Final diagnoses:  Right thigh pain    ED Discharge Orders    None       Glyn Ade, PA-C 07/02/17 1526    Glyn Ade, PA-C 07/02/17 1526    Long, Wonda [facility], MD 07/02/17 [phone]

## 2017-07-02 NOTE — ED Triage Notes (Signed)
R leg pain that starts just below the buttock, radiating down her leg x 2 weeks. Has been seen by her PCP, pain persists.

## 2017-07-02 NOTE — Discharge Instructions (Signed)
Please follow-up with your regular doctor regarding her blood pressure which is elevated in the ER today.  Please also follow-up in a week if your symptoms are not improving.  Return to the emergency department if you have any new or concerning symptoms including loss of sensation, weakness, fever or notice leg swelling.

## 2017-07-18 ENCOUNTER — Ambulatory Visit (INDEPENDENT_AMBULATORY_CARE_PROVIDER_SITE_OTHER): Payer: Medicare Other | Admitting: Family Medicine

## 2017-07-18 ENCOUNTER — Ambulatory Visit (INDEPENDENT_AMBULATORY_CARE_PROVIDER_SITE_OTHER): Payer: Medicare Other

## 2017-07-18 ENCOUNTER — Encounter: Payer: Self-pay | Admitting: Family Medicine

## 2017-07-18 VITALS — BP 136/76 | HR 99 | Ht 66.0 in | Wt 186.1 lb

## 2017-07-18 DIAGNOSIS — M79604 Pain in right leg: Secondary | ICD-10-CM

## 2017-07-18 DIAGNOSIS — M47816 Spondylosis without myelopathy or radiculopathy, lumbar region: Secondary | ICD-10-CM | POA: Diagnosis not present

## 2017-07-18 DIAGNOSIS — M5430 Sciatica, unspecified side: Secondary | ICD-10-CM | POA: Insufficient documentation

## 2017-07-18 MED ORDER — GABAPENTIN 300 MG PO CAPS: ORAL_CAPSULE | ORAL | 1 refills | Status: DC

## 2017-07-18 NOTE — Progress Notes (Signed)
Subjective:  Patient ID: Kelly Chase, female    DOB: 1945-10-11  Age: 72 y.o. MRN: [id number]  CC: right leg pain (pain is not any better, muscle relaxers did not help, pain is worse in the morning when patient wakes up. Patient unable to sleep well and has pain with sitting/laying.)   HPI Kelly Chase presents for follow-up of her posterior right leg pain.  Patient says that the muscle relaxer helps her sleep but does not seem to help much even though she describes muscle spasm-like sensations with her posterior leg pain.  She is not admitting that the pain has an electrical shock nature as much as it feels like a cramp moving down the back of her right thigh into her calf.  She has a little lower back pain.  Of fall back in the fall where she slipped and landed on her buttocks seem to set this off.  She received a Decadron shot in the ER about a month ago and was not sure that it helped much.  She has had no saddle saddle paresthesias or bowel or bladder incontinence.  Outpatient Medications Prior to Visit  Medication Sig Dispense Refill  . alum & mag hydroxide-simeth (MAALOX/MYLANTA) 200-200-20 MG/5ML suspension Take by mouth as needed for indigestion or heartburn.    Marland Kitchen amLODipine (NORVASC) 10 MG tablet Take 1 tablet (10 mg total) by mouth daily. 100 tablet 1  . lisinopril (PRINIVIL,ZESTRIL) 20 MG tablet Take 1 tablet (20 mg total) by mouth daily. 90 tablet 3  . traZODone (DESYREL) 100 MG tablet Take 1/2 to 1 at night for sleep as needed. 30 tablet 2  . cyclobenzaprine (FLEXERIL) 5 MG tablet Take 1 tablet (5 mg total) by mouth daily. 30 tablet 0  . meloxicam (MOBIC) 15 MG tablet Take 1 tablet (15 mg total) by mouth daily. With food. 15 tablet 0   No facility-administered medications prior to visit.     ROS Review of Systems  Constitutional: Negative.   HENT: Negative.   Respiratory: Negative.   Cardiovascular: Negative.   Gastrointestinal: Negative.  Negative for constipation and  diarrhea.  Genitourinary: Negative for decreased urine volume and difficulty urinating.  Musculoskeletal: Positive for myalgias. Negative for back pain.  Skin: Negative.   Allergic/Immunologic: Negative for immunocompromised state.  Neurological: Negative for weakness and numbness.  Hematological: Does not bruise/bleed easily.  Psychiatric/Behavioral: Negative.     Objective:  BP 136/76   Pulse 99   Ht 5\' 6"  (1.676 m)   Wt 186 lb 2 oz (84.4 kg)   SpO2 98%   BMI 30.04 kg/m   BP Readings from Last 3 Encounters:  07/18/17 136/76  07/02/17 (!) 148/75  06/27/17 (!) 142/86    Wt Readings from Last 3 Encounters:  07/18/17 186 lb 2 oz (84.4 kg)  07/02/17 190 lb (86.2 kg)  06/27/17 190 lb 6 oz (86.4 kg)    Physical Exam  Constitutional: She appears well-developed and well-nourished. No distress.  HENT:  Head: Normocephalic and atraumatic.  Right Ear: External ear normal.  Left Ear: External ear normal.  Eyes: Conjunctivae are normal. Right eye exhibits no discharge. Left eye exhibits no discharge. No scleral icterus.  Neck: No JVD present. No tracheal deviation present.  Pulmonary/Chest: Effort normal.  Musculoskeletal:       Right hip: She exhibits normal range of motion, normal strength and no tenderness.       Left hip: She exhibits normal range of motion, normal strength and no tenderness.  Lumbar back: She exhibits normal range of motion, no tenderness and no bony tenderness.  Neurological: She has normal strength.  Reflex Scores:      Patellar reflexes are 1+ on the right side and 1+ on the left side.      Achilles reflexes are 1+ on the right side and 1+ on the left side. Negative dural tension signs.   Skin: She is not diaphoretic.    Lab Results  Component Value Date   WBC 5.3 06/09/2017   HGB 12.0 06/09/2017   HCT 35.5 (L) 06/09/2017   PLT 384.0 06/09/2017   GLUCOSE 86 06/09/2017   CHOL 200 06/09/2017   TRIG 114.0 06/09/2017   HDL 49.80 06/09/2017    LDLCALC 127 (H) 06/09/2017   ALT 17 06/09/2017   AST 17 06/09/2017   NA 139 06/09/2017   K 5.1 06/09/2017   CL 105 06/09/2017   CREATININE 1.06 06/09/2017   BUN 16 06/09/2017   CO2 24 06/09/2017   TSH 2.451 06/23/2014    No results found.  Assessment & Plan:   Kelly Chase was seen today for right leg pain.  Diagnoses and all orders for this visit:  Leg pain, posterior, right -     DG Lumbar Spine Complete; Future -     Ambulatory referral to Sports Medicine -     gabapentin (NEURONTIN) 300 MG capsule; Take one nightly for one week and then twice each day as tolerated. -     DG Lumbar Spine Complete   I have discontinued Kelly Chase's meloxicam and cyclobenzaprine. I am also having her start on gabapentin. Additionally, I am having her maintain her alum & mag hydroxide-simeth, lisinopril, traZODone, and amLODipine.  Meds ordered this encounter  Medications  . gabapentin (NEURONTIN) 300 MG capsule    Sig: Take one nightly for one week and then twice each day as tolerated.    Dispense:  90 capsule    Refill:  1     Follow-up: Return in about 1 month (around 08/17/2017).  Kelly Maw, MD

## 2017-07-20 ENCOUNTER — Ambulatory Visit (INDEPENDENT_AMBULATORY_CARE_PROVIDER_SITE_OTHER): Payer: Medicare Other | Admitting: Family Medicine

## 2017-07-20 ENCOUNTER — Encounter: Payer: Self-pay | Admitting: Family Medicine

## 2017-07-20 VITALS — BP 138/72 | HR 81 | Temp 98.0°F | Ht 66.0 in | Wt 189.0 lb

## 2017-07-20 DIAGNOSIS — G5701 Lesion of sciatic nerve, right lower limb: Secondary | ICD-10-CM

## 2017-07-20 NOTE — Progress Notes (Signed)
Kelly Chase - 72 y.o. female MRN [id number]  Date of birth: March 06, 1946  SUBJECTIVE:  Including CC & ROS.  Chief Complaint  Patient presents with  . Leg Pain    Kelly Chase is a 72 y.o. female that is presenting with sciatica type symptoms down her right posterior leg. Pain has been ongoing for one month. Pain is constant. Mild to severe sitting and laying down. She has been applying ice. Denies injury.  She was seen on 4/29 but has not started the gabapentin.  Pain is worse with walking and lying down at night.  She had limited improvement with the steroid injection she received in the emergency department.  This pain started with no inciting event.  The pain is staying the same.  She denies any numbness or weakness.  Independent review of the lumbar spine x-ray from 4/29 shows mild degenerative changes of the facet joints in the lumbar spine.   Review of Systems  Constitutional: Negative for fever.  HENT: Negative for congestion.   Respiratory: Negative for cough.   Cardiovascular: Negative for chest pain.  Gastrointestinal: Negative for abdominal pain.  Musculoskeletal: Positive for back pain.  Skin: Negative for color change.  Allergic/Immunologic: Negative for immunocompromised state.  Neurological: Negative for weakness.  Hematological: Negative for adenopathy.  Psychiatric/Behavioral: Negative for agitation.    HISTORY: Past Medical, Surgical, Social, and Family History Reviewed & Updated per EMR.   Pertinent Historical Findings include:  Past Medical History:  Diagnosis Date  . Hypertension   . 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 ABDOMINAL HYSTERECTOMY    . BREAST SURGERY    . EYE SURGERY    . KNEE ARTHROSCOPY      Allergies  Allergen Reactions  . Celecoxib Other (See Comments)    GI upset  . Esomeprazole Other (See Comments)    Joint pain  . Feldene [Piroxicam] Other (See  Comments)    abd "burning"  . Chlorthalidone Palpitations    No family history on file.   Social History   Socioeconomic History  . Marital status: Divorced    Spouse name: Not on file  . Number of children: Not on file  . Years of education: Not on file  . Highest education level: Not on file  Occupational History  . Not on file  Social Needs  . Financial resource strain: Not on file  . Food insecurity:    Worry: Not on file    Inability: Not on file  . Transportation needs:    Medical: Not on file    Non-medical: Not on file  Tobacco Use  . Smoking status: Never Smoker  . Smokeless tobacco: Never Used  . Tobacco comment: NEVER USED TOBACCO  Substance and Sexual Activity  . Alcohol use: No    Alcohol/week: 0.0 oz  . Drug use: No  . Sexual activity: Not on file  Lifestyle  . Physical activity:    Days per week: Not on file    Minutes per session: Not on file  . Stress: Not on file  Relationships  . Social connections:    Talks on phone: Not on file    Gets together: Not on file    Attends religious service: Not on file    Active member of club or organization: Not on file    Attends meetings of clubs or organizations: Not on file    Relationship status: Not on file  .  Intimate partner violence:    Fear of current or ex partner: Not on file    Emotionally abused: Not on file    Physically abused: Not on file    Forced sexual activity: Not on file  Other Topics Concern  . Not on file  Social History Narrative  . Not on file     PHYSICAL EXAM:  VS: BP 138/72 (BP Location: Left Arm, Patient Position: Sitting, Cuff Size: Normal)   Pulse 81   Temp 98 F (36.7 C) (Oral)   Ht 5\' 6"  (1.676 m)   Wt 189 lb (85.7 kg)   SpO2 98%   BMI 30.51 kg/m  Physical Exam Gen: NAD, alert, cooperative with exam, well-appearing ENT: normal lips, normal nasal mucosa,  Eye: normal EOM, normal conjunctiva and lids CV:  no edema, +2 pedal pulses   Resp: no accessory muscle  use, non-labored,  Skin: no rashes, no areas of induration  Neuro: normal tone, normal sensation to touch Psych:  normal insight, alert and oriented MSK:  Back Exam:  Inspection: Unremarkable  Palpable tenderness: Mild tenderness to palpation of the piriformis and at the origin of the biceps femoris Normal strength resistance with knee flexion. No significant area tenderness to the hamstring. Normal knee flexion and extension Gait unremarkable. SLR laying: Negative  XSLR laying: Negative  FABER: negative. Neurovascularly intact      ASSESSMENT & PLAN:   Piriformis syndrome Pain is worse when she is sitting down and seems to originate from her buttock.  This would suggest more of a piriformis syndrome.  - start gabapentin  - counseled on HEP  - if no improvement consider PT or injection.

## 2017-07-20 NOTE — Patient Instructions (Signed)
Please try the gabapentin  Please the exercises  Please follow up with me in 3-4 weeks if your pain hasn't improved.

## 2017-07-20 NOTE — Assessment & Plan Note (Signed)
Pain is worse when she is sitting down and seems to originate from her buttock.  This would suggest more of a piriformis syndrome.  - start gabapentin  - counseled on HEP  - if no improvement consider PT or injection.

## 2017-08-18 ENCOUNTER — Telehealth: Payer: Self-pay | Admitting: Family Medicine

## 2017-08-18 NOTE — Telephone Encounter (Signed)
Copied from [place] [id number]. Topic: Quick Communication - Rx Refill/Question >> Aug 18, 2017  2:17 PM Boyd Kerbs wrote: Medication:  gabapentin (NEURONTIN) 300 MG capsule  Pt. Is questioning this medication - still having tingling in feet. She is wondering if possible pinched nerve. When wake up in am very painful lower back and leg. Not sure if working or may need different dosage.   Has the patient contacted their pharmacy? No. (Agent: If no, request that the patient contact the pharmacy for the refill.) (Agent: If yes, when and what did the pharmacy advise?)  Preferred Pharmacy (with phone number or street name):  [facility] [place], [place] - [address] [place] [address] Phone: [phone] Fax: [phone]    Agent: Please be advised that RX refills may take up to 3 business days. We ask that you follow-up with your pharmacy.

## 2017-08-19 ENCOUNTER — Telehealth: Payer: Self-pay | Admitting: Family Medicine

## 2017-08-19 NOTE — Telephone Encounter (Signed)
Spoke with patient, appointment made with Dr. Ethelene Hal for 6/3 at 9:30.

## 2017-08-19 NOTE — Telephone Encounter (Signed)
Copied from [place] [id number]. Topic: Quick Communication - Office Called Patient >> Aug 19, 2017  9:28 AM Self, Rande Brunt, CMA wrote: I left a detailed voicemail for patient. Dr. Ethelene Hal wants patient to try using the Gabapentin 3 times daily and to follow up with Dr. Raeford Razor, as he mentioned an injection might be a possibility to help with the pain. Okay for triage nurse to go over with patient.  >> Aug 19, 2017  4:36 PM Vernona Rieger wrote: Patient would like the nurse to call her about the gabapentin. Please advise. She can not function with taking that 3 times daily.

## 2017-08-19 NOTE — Telephone Encounter (Signed)
Try to take neurontin three times daily.  Dr. Raeford Razor suggested a possible injection. She should fu with him.

## 2017-08-19 NOTE — Telephone Encounter (Signed)
I left a detailed voicemail for patient. Dr. Ethelene Hal wants patient to try using the Gabapentin 3 times daily and to follow up with Dr. Raeford Razor, as he mentioned an injection might be a possibility to help with the pain. Okay for triage nurse to go over with patient.

## 2017-08-19 NOTE — Telephone Encounter (Signed)
Copied from [place] [id number]. Topic: Quick Communication - Office Called Patient >> Aug 19, 2017  9:28 AM Self, Rande Brunt, CMA wrote: I left a detailed voicemail for patient. Dr. Ethelene Hal wants patient to try using the Gabapentin 3 times daily and to follow up with Dr. Raeford Razor, as he mentioned an injection might be a possibility to help with the pain. Okay for triage nurse to go over with patient.

## 2017-08-22 ENCOUNTER — Ambulatory Visit (INDEPENDENT_AMBULATORY_CARE_PROVIDER_SITE_OTHER): Payer: Medicare Other | Admitting: Family Medicine

## 2017-08-22 ENCOUNTER — Encounter: Payer: Self-pay | Admitting: Family Medicine

## 2017-08-22 VITALS — BP 138/72 | HR 94 | Ht 66.0 in | Wt 186.1 lb

## 2017-08-22 DIAGNOSIS — G5701 Lesion of sciatic nerve, right lower limb: Secondary | ICD-10-CM

## 2017-08-22 MED ORDER — PREDNISONE 10 MG (21) PO TBPK: ORAL_TABLET | ORAL | 0 refills | Status: DC

## 2017-08-22 NOTE — Progress Notes (Signed)
Subjective:  Patient ID: Kelly Chase, female    DOB: [birth year]  Age: 72 y.o. MRN: [id number]  CC: Medication Problem   HPI [name] presents for follow-up of her piriformis syndrome.  She has been uncomfortable about doing her exercises with the discomfort that she is feeling.  She is not tolerating taking Neurontin more than just at nighttime.  It is making her too groggy during the daytime.  Outpatient Medications Prior to Visit  Medication Sig Dispense Refill  . alum & mag hydroxide-simeth (MAALOX/MYLANTA) 200-200-20 MG/5ML suspension Take by mouth as needed for indigestion or heartburn.    Marland Kitchen amLODipine (NORVASC) 10 MG tablet Take 1 tablet (10 mg total) by mouth daily. 100 tablet 1  . lisinopril (PRINIVIL,ZESTRIL) 20 MG tablet Take 1 tablet (20 mg total) by mouth daily. 90 tablet 3  . traZODone (DESYREL) 100 MG tablet Take 1/2 to 1 at night for sleep as needed. 30 tablet 2  . gabapentin (NEURONTIN) 300 MG capsule Take one nightly for one week and then twice each day as tolerated. (Patient not taking: Reported on 08/22/2017) 90 capsule 1   No facility-administered medications prior to visit.     ROS Review of Systems  Constitutional: Negative.   Respiratory: Negative.   Cardiovascular: Negative.   Gastrointestinal: Negative.   Endocrine: Negative for polyphagia and polyuria.  Musculoskeletal: Positive for arthralgias and gait problem.  Skin: Negative.   Neurological: Negative for weakness and numbness.  Hematological: Does not bruise/bleed easily.  Psychiatric/Behavioral: Negative.     Objective:  BP 138/72   Pulse 94   Ht 5\' 6"  (1.676 m)   Wt 186 lb 2 oz (84.4 kg)   SpO2 100%   BMI 30.04 kg/m   BP Readings from Last 3 Encounters:  08/22/17 138/72  07/20/17 138/72  07/18/17 136/76    Wt Readings from Last 3 Encounters:  08/22/17 186 lb 2 oz (84.4 kg)  07/20/17 189 lb (85.7 kg)  07/18/17 186 lb 2 oz (84.4 kg)    Physical Exam  Constitutional: She is  oriented to person, place, and time. She appears well-developed and well-nourished. No distress.  HENT:  Head: Normocephalic and atraumatic.  Right Ear: External ear normal.  Left Ear: External ear normal.  Eyes: Right eye exhibits no discharge. Left eye exhibits no discharge. No scleral icterus.  Pulmonary/Chest: Effort normal.  Neurological: She is alert and oriented to person, place, and time.  Skin: She is not diaphoretic.  Psychiatric: She has a normal mood and affect. Her behavior is normal.    Lab Results  Component Value Date   WBC 5.3 06/09/2017   HGB 12.0 06/09/2017   HCT 35.5 (L) 06/09/2017   PLT 384.0 06/09/2017   GLUCOSE 86 06/09/2017   CHOL 200 06/09/2017   TRIG 114.0 06/09/2017   HDL 49.80 06/09/2017   LDLCALC 127 (H) 06/09/2017   ALT 17 06/09/2017   AST 17 06/09/2017   NA 139 06/09/2017   K 5.1 06/09/2017   CL 105 06/09/2017   CREATININE 1.06 06/09/2017   BUN 16 06/09/2017   CO2 24 06/09/2017   TSH 2.451 06/23/2014    No results found.  Assessment & Plan:   Myrian was seen today for medication problem.  Diagnoses and all orders for this visit:  Piriformis syndrome of right side -     predniSONE (STERAPRED UNI-PAK 21 TAB) 10 MG (21) TBPK tablet; Take six today, 5 tomorrow, 4 the next day and so forth until finished.  I am having Drue Stager start on predniSONE. I am also having her maintain her alum & mag hydroxide-simeth, lisinopril, traZODone, amLODipine, and gabapentin.  Meds ordered this encounter  Medications  . predniSONE (STERAPRED UNI-PAK 21 TAB) 10 MG (21) TBPK tablet    Sig: Take six today, 5 tomorrow, 4 the next day and so forth until finished.    Dispense:  21 tablet    Refill:  0   We will start a 6-day Dosepak.  She will try her best to do the exercises given to her by sports medicine.  She will take the Neurontin only in the evening.  She will let me know how this alcohol this does for her.  May consider referral back to sports  medicine for an injection.  Follow-up: No follow-ups on file.  Libby Maw, MD

## 2017-09-09 ENCOUNTER — Ambulatory Visit: Payer: Medicare Other | Admitting: Family Medicine

## 2017-09-13 NOTE — Progress Notes (Signed)
Kelly Chase - 72 y.o. female MRN [id number]  Date of birth: Feb 01, 1946  SUBJECTIVE:  Including CC & ROS.  Chief Complaint  Patient presents with  . Follow-up    Kelly Chase is a 72 y.o. female that is here today for follow up right piriformis pain. Pain has not improved. She was prescribed by her PCP a course of prednisone. She reports no difference in her pain. Sitting worsens the pain. Admits to tingling and numbness. She has been performing daily exercises. The pain start in her right buttock and has radicular symptoms down the posterior aspect of her right leg to her knee. Has intermittent sensation changes in her foot. No improvement with gabapentin. Started on April 8.   Review of the lumbar x-ray from 4/29 shows mild degenerative facet disease.  Review of Systems  Constitutional: Negative for fever.  HENT: Negative for congestion.   Respiratory: Negative for cough.   Cardiovascular: Negative for chest pain.  Gastrointestinal: Negative for abdominal pain.  Musculoskeletal: Positive for back pain.  Skin: Negative for color change.  Neurological: Negative for weakness.  Hematological: Negative for adenopathy.  Psychiatric/Behavioral: Negative for agitation.    HISTORY: Past Medical, Surgical, Social, and Family History Reviewed & Updated per EMR.   Pertinent Historical Findings include:  Past Medical History:  Diagnosis Date  . Hypertension   . 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 ABDOMINAL HYSTERECTOMY    . BREAST SURGERY    . EYE SURGERY    . KNEE ARTHROSCOPY      Allergies  Allergen Reactions  . Celecoxib Other (See Comments)    GI upset  . Esomeprazole Other (See Comments)    Joint pain  . Feldene [Piroxicam] Other (See Comments)    abd "burning"  . Chlorthalidone Palpitations    No family history on file.   Social History   Socioeconomic History  . Marital status:  Divorced    Spouse name: Not on file  . Number of children: Not on file  . Years of education: Not on file  . Highest education level: Not on file  Occupational History  . Not on file  Social Needs  . Financial resource strain: Not on file  . Food insecurity:    Worry: Not on file    Inability: Not on file  . Transportation needs:    Medical: Not on file    Non-medical: Not on file  Tobacco Use  . Smoking status: Never Smoker  . Smokeless tobacco: Never Used  . Tobacco comment: NEVER USED TOBACCO  Substance and Sexual Activity  . Alcohol use: No    Alcohol/week: 0.0 oz  . Drug use: No  . Sexual activity: Not on file  Lifestyle  . Physical activity:    Days per week: Not on file    Minutes per session: Not on file  . Stress: Not on file  Relationships  . Social connections:    Talks on phone: Not on file    Gets together: Not on file    Attends religious service: Not on file    Active member of club or organization: Not on file    Attends meetings of clubs or organizations: Not on file    Relationship status: Not on file  . Intimate partner violence:    Fear of current or ex partner: Not on file    Emotionally abused: Not on file    Physically  abused: Not on file    Forced sexual activity: Not on file  Other Topics Concern  . Not on file  Social History Narrative  . Not on file     PHYSICAL EXAM:  VS: BP 128/76 (BP Location: Left Arm, Patient Position: Sitting, Cuff Size: Normal)   Pulse 82   Ht 5\' 6"  (1.676 m)   Wt 187 lb (84.8 kg)   SpO2 98%   BMI 30.18 kg/m  Physical Exam Gen: NAD, alert, cooperative with exam, well-appearing ENT: normal lips, normal nasal mucosa,  Eye: normal EOM, normal conjunctiva and lids CV:  no edema, +2 pedal pulses   Resp: no accessory muscle use, non-labored,  Skin: no rashes, no areas of induration  Neuro: normal tone, normal sensation to touch Psych:  normal insight, alert and oriented MSK:  Back:  No TTP of the  piriformis, glute med or GT  Normal strength resistance with flexion, knee flexion and extension, and plantar and dorsiflexion Weakness with hip abduction. Normal internal and external rotation. Normal gait. Positive straight leg raise on the right. Negative straight leg on the left. Neurovascularly intact     ASSESSMENT & PLAN:   Piriformis syndrome Pain is been ongoing.  Not much improvement with gabapentin and intolerant to NSAIDs.  Has tried the exercises but the pain seems to be worse the next day. - IM Deprol - nortriptyline - referral to physical therapy - if no improvement MRI lumbar spine to consider epidurals.

## 2017-09-14 ENCOUNTER — Ambulatory Visit: Payer: Self-pay

## 2017-09-14 ENCOUNTER — Encounter: Payer: Self-pay | Admitting: Family Medicine

## 2017-09-14 ENCOUNTER — Ambulatory Visit (INDEPENDENT_AMBULATORY_CARE_PROVIDER_SITE_OTHER): Payer: Medicare Other | Admitting: Family Medicine

## 2017-09-14 VITALS — BP 128/76 | HR 82 | Ht 66.0 in | Wt 187.0 lb

## 2017-09-14 DIAGNOSIS — G5701 Lesion of sciatic nerve, right lower limb: Secondary | ICD-10-CM

## 2017-09-14 MED ORDER — METHYLPREDNISOLONE ACETATE 40 MG/ML IJ SUSP
40.0000 mg | Freq: Once | INTRAMUSCULAR | Status: AC
  Administered 2017-09-14: 40 mg via INTRAMUSCULAR

## 2017-09-14 MED ORDER — NORTRIPTYLINE HCL 10 MG PO CAPS: 10.0000 mg | ORAL_CAPSULE | Freq: Every day | ORAL | 1 refills | Status: DC

## 2017-09-14 NOTE — Assessment & Plan Note (Signed)
Pain is been ongoing.  Not much improvement with gabapentin and intolerant to NSAIDs.  Has tried the exercises but the pain seems to be worse the next day. - IM Deprol - nortriptyline - referral to physical therapy - if no improvement MRI lumbar spine to consider epidurals.

## 2017-09-14 NOTE — Patient Instructions (Signed)
Please try the new medication that I have prescribed. Please let me know how you tolerate it.  I have made a referral to physical therapy and they will call you to set this up.  Please follow up with me in 4 weeks

## 2017-09-28 ENCOUNTER — Other Ambulatory Visit: Payer: Self-pay

## 2017-09-28 ENCOUNTER — Ambulatory Visit: Payer: Medicare Other | Attending: Family Medicine | Admitting: Physical Therapy

## 2017-09-28 ENCOUNTER — Encounter: Payer: Self-pay | Admitting: Physical Therapy

## 2017-09-28 VITALS — BP 132/60 | HR 78

## 2017-09-28 DIAGNOSIS — R29818 Other symptoms and signs involving the nervous system: Secondary | ICD-10-CM | POA: Diagnosis not present

## 2017-09-28 DIAGNOSIS — M6281 Muscle weakness (generalized): Secondary | ICD-10-CM | POA: Diagnosis not present

## 2017-09-28 DIAGNOSIS — M25551 Pain in right hip: Secondary | ICD-10-CM | POA: Diagnosis not present

## 2017-09-28 DIAGNOSIS — R29898 Other symptoms and signs involving the musculoskeletal system: Secondary | ICD-10-CM | POA: Diagnosis not present

## 2017-09-28 NOTE — Therapy (Signed)
[facility] [address]  [place], [place], [address] Phone: [phone]   Fax:  [phone]  Physical Therapy Evaluation  Patient Details  Name: Nazariah Cadet MRN: [id number] Date of Birth: 08-02-1945 Referring Provider: Clearance Coots, MD   Encounter Date: 09/28/2017  PT End of Session - 09/28/17 1309    Visit Number  1    Number of Visits  12    Date for PT Re-Evaluation  11/16/17    Authorization - Visit Number  10    PT Start Time  2025    PT Stop Time  1146    PT Time Calculation (min)  57 min    Behavior During Therapy  [facility] for tasks assessed/performed       Past Medical History:  Diagnosis Date  . Hypertension   . 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 ABDOMINAL HYSTERECTOMY    . BREAST SURGERY    . EYE SURGERY    . KNEE ARTHROSCOPY      Vitals:   09/28/17 1108  BP: 132/60  Pulse: 78  SpO2: 98%     Subjective Assessment - 09/28/17 1054    Subjective  Issues with the hip started around February after pt experienced a fall in November of 2018, and again in late December. Experiences some numbness, tingling in the R LE down into the foot with symptoms also around the R buttock area. Still experiences some tingling into the foot but not all the time. Exacerbated with movement often including when getting out of bed.  Recently purchased a more firm mattress after a recommendation but getting up from that causes pain, has been sleeping on the couch at night. Has been alternating heat/ice to relieve the pain. Problem has been gradually getting better and mentions that it is a lot better since when the problem started.     Patient is accompained by:  Family member    Limitations  Sitting;House hold activities;Lifting    How long can you sit comfortably?  On a harder surface, 2 hours    Patient Stated Goals  Wants to be able  to lift a gallon of water,     Currently in Pain?  No/denies    Pain Score  0-No pain 6 - when getting up from laying down    Pain Location  Buttocks    Pain Orientation  Right    Pain Descriptors / Indicators  Aching    Pain Type  Chronic pain    Pain Radiating Towards  back of the R leg down the calf and into the foot    Pain Onset  More than a month ago    Pain Frequency  Intermittent    Aggravating Factors   Standing from supine    Pain Relieving Factors  Heat, Ice, injection    Effect of Pain on Daily Activities  Carries less weight at a time, especially when bringing groceries into the house         [facility] PT Assessment - 09/28/17 1043      Assessment   Medical Diagnosis  R Piriformis Syndrome    Referring Provider  Clearance Coots, MD    Onset Date/Surgical Date  -- Beginning of February    Next MD Visit  10/12/2017    Prior Therapy  Yes      Balance Screen   Has the patient fallen  in the past 6 months  No    Has the patient had a decrease in activity level because of a fear of falling?   No    Is the patient reluctant to leave their home because of a fear of falling?   No      Home Environment   Living Environment  Private residence    Living Arrangements  Alone    Type of [place] to enter    Entrance Stairs-Number of Steps  1    [place]  One level      Prior Function   Level of [place]  Retired    Leisure  Walking, Actor, Doing Puzzles      Observation/Other Assessments   Focus on Therapeutic Outcomes (FOTO)   R Hip - Status 63% (37% limitation); Predicted 66% (34% limited)      Sensation   Additional Comments  Pt expressed numbness noted in L5 Dermatome      Posture/Postural Control   Postural Limitations  Decreased lumbar lordosis;Posterior pelvic tilt    Posture Comments  Pt stands with PPT and knees slightly bent      AROM   Lumbar Flexion  WNL    Lumbar Extension  WNL    Lumbar - Right  Side Bend  WNL    Lumbar - Left Side Bend  WNL    Lumbar - Right Rotation  WNL    Lumbar - Left Rotation  WNL      Strength   Right/Left Hip  Right;Left    Right Hip Flexion  4/5    Right Hip Extension  3+/5    Right Hip External Rotation   3+/5    Right Hip Internal Rotation  4-/5    Right Hip ABduction  4-/5    Right Hip ADduction  3+/5    Left Hip Flexion  4/5    Left Hip Extension  4-/5    Left Hip External Rotation  4-/5    Left Hip Internal Rotation  4-/5    Left Hip ABduction  4/5    Left Hip ADduction  3+/5    Right/Left Knee  Right;Left    Right Knee Flexion  4-/5    Right Knee Extension  4/5    Left Knee Flexion  4/5    Left Knee Extension  4/5      Flexibility   Hamstrings  Mod tightness noted in R hamstring      Palpation   Palpation comment  No TTP noted      Special Tests   Lumbar Tests  Straight Leg Raise;Slump Test;FABER test    Sacroiliac Tests   Pelvic Distraction    Hip Special Tests   Ober's Test;Piriformis Test      FABER test   findings  Negative    Side  Right      Slump test   Findings  Positive    Side  Right    Comment  Numbness in the dermatomal distribution of L5      Straight Leg Raise   Findings  Negative    Side   Right      Pelvic Dictraction   Findings  Negative    Side   Right      Ober's Test   Findings  Positive    Side  Right      Piriformis Test   Findings  Negative    Side   Right                Objective measurements completed on examination: See above findings.              PT Education - 09/28/17 1307    Education Details  Pt educated on results of examination, PT POC, HEP exercises, and student clinical reasoning project waiver form.     Person(s) Educated  Patient    Methods  Explanation;Handout;Verbal cues    Comprehension  Verbalized understanding;Returned demonstration;Verbal cues required       PT Short Term Goals - 09/28/17 1323      PT SHORT TERM GOAL #1   Title  Pt will  be independent with initial HEP    Status  New    Target Date  10/19/17      PT SHORT TERM GOAL #2   Title  Pt will report ability to carry 2 jugs of water into house with no increased pain     Status  New    Target Date  10/19/17        PT Long Term Goals - 09/28/17 1327      PT LONG TERM GOAL #1   Title  Pt will be independent with advanced HEP    Status  New    Target Date  11/09/17      PT LONG TERM GOAL #2   Title  Pt will have global hip musculature strength of 4/5 to provide greater stabilization for movement of the LE    Status  New    Target Date  11/09/17      PT LONG TERM GOAL #3   Title  Pt will report pain score no greater than 3/10 at worst     Status  New    Target Date  11/09/17      PT LONG TERM GOAL #4   Title  Pt will not report any numbness in the R LE    Status  New    Target Date  11/09/17             Plan - 09/28/17 1318    Clinical Impression Statement  Pt is a 72 y/o who presents to OP PT with dx of R piriformis syndrome. Examination revealed decreased strength of hip musculature, numbness in the L5 dermatomal distribution, and moderate hamstring tightness. These impairments are limiting pt's ability to perform functional activities such as carrying groceries into the house from her car at [employer] [facility] [facility] [employer] [facility]. Her prognosis is positively impacted by the fact that she is experiencing a gradual improvement in symptoms since onset.     Clinical Presentation  Stable    Clinical Decision Making  Low    Rehab Potential  Good    PT Frequency  2x / week 1x as necessary    PT Duration  6 weeks    PT Treatment/Interventions  ADLs/Self Care Home Management;Cryotherapy;Electrical Stimulation;Functional mobility training;Iontophoresis 4mg /ml Dexamethasone;Moist Heat;Manual techniques;Therapeutic activities;Therapeutic exercise;Neuromuscular re-education;Patient/family education;Passive range of motion;Dry needling;Taping    Consulted and Agree with Plan of Care  Patient        Patient will benefit from skilled therapeutic intervention in order to improve the following deficits and impairments:  Decreased strength, Pain, Increased muscle spasms, Decreased range of motion, Improper body mechanics, Impaired flexibility, Postural dysfunction  Visit Diagnosis: Pain in right hip  Muscle weakness (generalized)  Other symptoms and signs involving the nervous system  Other symptoms and  signs involving the musculoskeletal system     Problem List Patient Active Problem List   Diagnosis Date Noted  . Piriformis syndrome 07/18/2017  . HTN (hypertension) 06/09/2017  . Health care maintenance 06/09/2017  . Primary insomnia 06/09/2017  . MGUS (monoclonal gammopathy of unknown significance) 09/24/2014    Shirline Frees, SPT  09/28/2017, 1:41 PM  [facility] [address]  [place], [place], [address] Phone: [phone]   Fax:  [phone]  Name: Jiayi Lengacher MRN: [id number] Date of Birth: 02/28/1946

## 2017-10-05 ENCOUNTER — Encounter: Payer: Self-pay | Admitting: Physical Therapy

## 2017-10-05 ENCOUNTER — Ambulatory Visit: Payer: Medicare Other | Admitting: Physical Therapy

## 2017-10-05 DIAGNOSIS — M25551 Pain in right hip: Secondary | ICD-10-CM | POA: Diagnosis not present

## 2017-10-05 DIAGNOSIS — R29818 Other symptoms and signs involving the nervous system: Secondary | ICD-10-CM | POA: Diagnosis not present

## 2017-10-05 DIAGNOSIS — R29898 Other symptoms and signs involving the musculoskeletal system: Secondary | ICD-10-CM | POA: Diagnosis not present

## 2017-10-05 DIAGNOSIS — M6281 Muscle weakness (generalized): Secondary | ICD-10-CM

## 2017-10-05 NOTE — Therapy (Signed)
[facility] [address]  [place], [place], [address] Phone: [phone]   Fax:  [phone]  Physical Therapy Treatment  Patient Details  Name: Kelly Chase MRN: [id number] Date of Birth: 04-Feb-1946 Referring Provider: Clearance Coots, MD   Encounter Date: 10/05/2017  PT End of Session - 10/05/17 1010    Visit Number  2    Number of Visits  12    Date for PT Re-Evaluation  11/16/17    PT Start Time  1006    PT Stop Time  9702    PT Time Calculation (min)  66 min    Activity Tolerance  No increased pain;Patient tolerated treatment well    Behavior During Therapy  [facility] for tasks assessed/performed       Past Medical History:  Diagnosis Date  . Hypertension   . 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 ABDOMINAL HYSTERECTOMY    . BREAST SURGERY    . EYE SURGERY    . KNEE ARTHROSCOPY      There were no vitals filed for this visit.  Subjective Assessment - 10/05/17 1011    Subjective  Pt states that she is feeling achey today, especially around the R hip. Pt states compliance with exercises and that they have helped her pain since the last visit.                        [facility] Adult PT Treatment/Exercise - 10/05/17 0001      Lumbar Exercises: Stretches   Passive Hamstring Stretch  --    Single Knee to Chest Stretch  --    Piriformis Stretch  --    Piriformis Stretch Limitations  --      Knee/Hip Exercises: Stretches   Passive Hamstring Stretch  Right;1 rep;30 seconds    Piriformis Stretch  3 reps;30 seconds    Piriformis Stretch Limitations  With R ankle on lateral portion of L knee to prevent lumbar rotation during stretch     Other Knee/Hip Stretches  Sciatic Nerve Glide; 20 reps      Knee/Hip Exercises: Aerobic   Nustep  L4 x 6 min (B UE/LE)      Modalities   Modalities  Electrical Stimulation;Moist Heat       Moist Heat Therapy   Number Minutes Moist Heat  10 Minutes    Moist Heat Location  Hip;Other (comment) Right Posterior Hip      Electrical Stimulation   Electrical Stimulation Location  R Glute Musculature    Electrical Stimulation Action  IFC    Electrical Stimulation Parameters  80-150 Hz; 40% scan    Electrical Stimulation Goals  Pain      Manual Therapy   Manual Therapy  Soft tissue mobilization;Myofascial release    Manual therapy comments  R Glute Med, Piriformis, Glute Min     Soft tissue mobilization  R Piriformis     Myofascial Release  R Glute Med, Piriformis, Glute Min        Trigger Point Dry Needling - 10/05/17 1006    Consent Given?  Yes    Education Handout Provided  Yes    Muscles Treated Lower Body  Gluteus minimus;Gluteus maximus & Gluteus medius on R    Gluteus Maximus Response  Twitch response elicited;Palpable increased muscle length    Gluteus Minimus Response  Twitch response elicited;Palpable increased muscle  length           PT Education - 10/05/17 1204    Education Details  Pt provided with new HEP exercises and modified stretches, pt educated on trigger point dry needling, and estim for pain control as well as potentially obtained estim unit in the future    Person(s) Educated  Patient    Methods  Explanation;Demonstration;Tactile cues;Verbal cues;Handout    Comprehension  Verbalized understanding;Returned demonstration;Verbal cues required;Tactile cues required       PT Short Term Goals - 10/05/17 1233      PT SHORT TERM GOAL #1   Title  Pt will be independent with initial HEP    Status  On-going    Target Date  10/19/17      PT SHORT TERM GOAL #2   Title  Pt will report ability to carry 2 jugs of water into house with no increased pain     Status  On-going    Target Date  10/19/17        PT Long Term Goals - 10/05/17 1233      PT LONG TERM GOAL #1   Title  Pt will be independent with advanced HEP    Status  On-going     Target Date  11/09/17      PT LONG TERM GOAL #2   Title  Pt will have global hip musculature strength of 4/5 to provide greater stabilization for movement of the LE    Status  On-going    Target Date  11/09/17      PT LONG TERM GOAL #3   Title  Pt will report pain score no greater than 3/10 at worst     Status  On-going    Target Date  11/09/17      PT LONG TERM GOAL #4   Title  Pt will not report any numbness in the R LE    Status  On-going    Target Date  11/09/17            Plan - 10/05/17 1231    Clinical Impression Statement  During manual therapy today PT felt trigger points in Glute Med musculature that could benefit from dry needling. Pt was educated on dry needling and was performed during today's session. PT noted good response in trigger points found during manual therapy and pt noted less tightness at end of session. Pt will continue to benefit from physical therapy to address pain, LE strength, and progress toward functional goals.     Rehab Potential  Good    PT Treatment/Interventions  ADLs/Self Care Home Management;Cryotherapy;Electrical Stimulation;Functional mobility training;Iontophoresis 4mg /ml Dexamethasone;Moist Heat;Manual techniques;Therapeutic activities;Therapeutic exercise;Neuromuscular re-education;Patient/family education;Passive range of motion;Dry needling;Taping    PT Next Visit Plan  Assess pt response to trigger point dry needling    Consulted and Agree with Plan of Care  Patient       Patient will benefit from skilled therapeutic intervention in order to improve the following deficits and impairments:  Decreased strength, Pain, Increased muscle spasms, Decreased range of motion, Improper body mechanics, Impaired flexibility, Postural dysfunction  Visit Diagnosis: Pain in right hip  Muscle weakness (generalized)  Other symptoms and signs involving the nervous system  Other symptoms and signs involving the musculoskeletal  system     Problem List Patient Active Problem List   Diagnosis Date Noted  . Piriformis syndrome 07/18/2017  . HTN (hypertension) 06/09/2017  . Health care maintenance 06/09/2017  . Primary insomnia 06/09/2017  .  MGUS (monoclonal gammopathy of unknown significance) 09/24/2014    Shirline Frees, SPT 10/05/2017, 12:53 PM  [facility] [address]  [place], [place], [address] Phone: [phone]   Fax:  [phone]  Name: Kelly Chase MRN: [id number] Date of Birth: [birth year]

## 2017-10-05 NOTE — Patient Instructions (Addendum)

## 2017-10-07 ENCOUNTER — Encounter: Payer: Self-pay | Admitting: Physical Therapy

## 2017-10-07 ENCOUNTER — Ambulatory Visit: Payer: Medicare Other | Admitting: Physical Therapy

## 2017-10-07 DIAGNOSIS — M25551 Pain in right hip: Secondary | ICD-10-CM | POA: Diagnosis not present

## 2017-10-07 DIAGNOSIS — M6281 Muscle weakness (generalized): Secondary | ICD-10-CM

## 2017-10-07 DIAGNOSIS — R29818 Other symptoms and signs involving the nervous system: Secondary | ICD-10-CM | POA: Diagnosis not present

## 2017-10-07 DIAGNOSIS — R29898 Other symptoms and signs involving the musculoskeletal system: Secondary | ICD-10-CM

## 2017-10-07 NOTE — Therapy (Signed)
[facility] [address]  [place], [place], [address] Phone: [phone]   Fax:  [phone]  Physical Therapy Treatment  Patient Details  Name: Kelly Chase MRN: [id number] Date of Birth: 02-19-1946 Referring Provider: Clearance Coots, MD   Encounter Date: 10/07/2017  PT End of Session - 10/07/17 1049    Visit Number  3    Number of Visits  12    Date for PT Re-Evaluation  11/16/17    PT Start Time  6962    PT Stop Time  1129    PT Time Calculation (min)  42 min    Activity Tolerance  No increased pain;Patient tolerated treatment well    Behavior During Therapy  [facility] for tasks assessed/performed       Past Medical History:  Diagnosis Date  . Hypertension   . 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 ABDOMINAL HYSTERECTOMY    . BREAST SURGERY    . EYE SURGERY    . KNEE ARTHROSCOPY      There were no vitals filed for this visit.  Subjective Assessment - 10/07/17 1050    Subjective  Pt has had good relief from dry needling and states that she has had less pain overall since the last visit.     Patient is accompained by:  Family member    Limitations  Sitting;House hold activities;Lifting    How long can you sit comfortably?  2-3 hours    Patient Stated Goals  Wants to be able to lift a gallon of water    Currently in Pain?  No/denies                       [facility] Adult PT Treatment/Exercise - 10/07/17 0001      Self-Care   Self-Care  Posture    Posture  Pt expressed concerns about not understanding adequate sitting posture during session today, therefore posture education provided. Education pertained to posture during ADLs including sleeping, when getting clothes in/out of washer/dryer, and when getting in/out of a vehicle. Pt expressed understanding of modifications and PT addressed any questions/concerns pt had  about movement patterns.        Lumbar Exercises: Quadruped   Straight Leg Raise  10 reps;Other (comment)    Straight Leg Raises Limitations  R single leg extension; VC to prevent pelvic rotation and equal WB through LEs UEs in quadraped      Knee/Hip Exercises: Aerobic   Nustep  L6 x 6 min (B UE/LE)      Knee/Hip Exercises: Standing   Hip Abduction  15 reps;Both    Abduction Limitations  Lateral walking; Red TB around bil forefoot      Knee/Hip Exercises: Supine   Bridges  10 reps;Both    Other Supine Knee/Hip Exercises  Hamstring curl; Bil LEs on peanut ball. 10 reps             PT Education - 10/07/17 1136    Education Details  Pt provided with new HEP exercises, and postural education     Person(s) Educated  Patient    Methods  Explanation;Demonstration;Tactile cues;Verbal cues    Comprehension  Verbalized understanding;Returned demonstration;Verbal cues required;Tactile cues required       PT Short Term Goals - 10/05/17 1233      PT SHORT TERM GOAL #1   Title  Pt will be  independent with initial HEP    Status  On-going    Target Date  10/19/17      PT SHORT TERM GOAL #2   Title  Pt will report ability to carry 2 jugs of water into house with no increased pain     Status  On-going    Target Date  10/19/17        PT Long Term Goals - 10/05/17 1233      PT LONG TERM GOAL #1   Title  Pt will be independent with advanced HEP    Status  On-going    Target Date  11/09/17      PT LONG TERM GOAL #2   Title  Pt will have global hip musculature strength of 4/5 to provide greater stabilization for movement of the LE    Status  On-going    Target Date  11/09/17      PT LONG TERM GOAL #3   Title  Pt will report pain score no greater than 3/10 at worst     Status  On-going    Target Date  11/09/17      PT LONG TERM GOAL #4   Title  Pt will not report any numbness in the R LE    Status  On-going    Target Date  11/09/17            Plan - 10/07/17 1136     Clinical Impression Statement  Pt tolerated treatment session well today with emphasis on postural education, motor control and strengthening of R hip and core musculature. Pt expressed increased fatigue in hip musculature after each exercises and end of session, and throughout session required VC to maintain correct form and to control movement.  Pt will continue to benefit from physical therapy to address pain, strengthen hip musculature, and improve functional mobility.     PT Treatment/Interventions  ADLs/Self Care Home Management;Cryotherapy;Electrical Stimulation;Functional mobility training;Iontophoresis 4mg /ml Dexamethasone;Moist Heat;Manual techniques;Therapeutic activities;Therapeutic exercise;Neuromuscular re-education;Patient/family education;Passive range of motion;Dry needling;Taping    Consulted and Agree with Plan of Care  Patient       Patient will benefit from skilled therapeutic intervention in order to improve the following deficits and impairments:  Decreased strength, Pain, Increased muscle spasms, Decreased range of motion, Improper body mechanics, Impaired flexibility, Postural dysfunction  Visit Diagnosis: Pain in right hip  Muscle weakness (generalized)  Other symptoms and signs involving the nervous system  Other symptoms and signs involving the musculoskeletal system     Problem List Patient Active Problem List   Diagnosis Date Noted  . Piriformis syndrome 07/18/2017  . HTN (hypertension) 06/09/2017  . Health care maintenance 06/09/2017  . Primary insomnia 06/09/2017  . 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  [place], [place], [address] Phone: [phone]   Fax:  [phone]  Name: Kelly Chase MRN: [id number] Date of Birth: [birth year]

## 2017-10-07 NOTE — Patient Instructions (Addendum)

## 2017-10-12 ENCOUNTER — Ambulatory Visit: Payer: Medicare Other | Admitting: Family Medicine

## 2017-10-12 ENCOUNTER — Encounter: Payer: Self-pay | Admitting: Physical Therapy

## 2017-10-12 ENCOUNTER — Ambulatory Visit: Payer: Medicare Other | Admitting: Physical Therapy

## 2017-10-12 DIAGNOSIS — R29818 Other symptoms and signs involving the nervous system: Secondary | ICD-10-CM | POA: Diagnosis not present

## 2017-10-12 DIAGNOSIS — M6281 Muscle weakness (generalized): Secondary | ICD-10-CM | POA: Diagnosis not present

## 2017-10-12 DIAGNOSIS — R29898 Other symptoms and signs involving the musculoskeletal system: Secondary | ICD-10-CM | POA: Diagnosis not present

## 2017-10-12 DIAGNOSIS — M25551 Pain in right hip: Secondary | ICD-10-CM

## 2017-10-12 NOTE — Therapy (Addendum)
[facility] [address]  [place], [place], [address] Phone: [phone]   Fax:  [phone]  Physical Therapy Treatment  Patient Details  Name: [place] MRN: [id number] Date of Birth: 04/28/1945 Referring Provider: Clearance Coots, MD   Encounter Date: 10/12/2017  PT End of Session - 10/12/17 0802    Visit Number  4    Number of Visits  12    Date for PT Re-Evaluation  11/16/17    PT Start Time  0759    PT Stop Time  0846    PT Time Calculation (min)  47 min    Activity Tolerance  No increased pain;Patient tolerated treatment well    Behavior During Therapy  [facility] for tasks assessed/performed       Past Medical History:  Diagnosis Date  . Hypertension   . 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 ABDOMINAL HYSTERECTOMY    . BREAST SURGERY    . EYE SURGERY    . KNEE ARTHROSCOPY      There were no vitals filed for this visit.  Subjective Assessment - 10/12/17 0803    Subjective  Pt reports that she is well today and that when she woke up this morning she was having pain in the R hip, but after icing it she was not experincing any pain. Pt reports that she is having difficulty and pain getting in/out of her bed as it is very high up.     Patient is accompained by:  Family member    Limitations  Sitting    Patient Stated Goals  Wants to be able to lift a gallon of water    Currently in Pain?  No/denies                       [facility] Adult PT Treatment/Exercise - 10/12/17 0801      Self-Care   Self-Care  ADL's    ADL's  Reviewed safer/more strategic way to get into and out of elevated bed at home as pt was expressing some pain/discomfort when performing this activity. Performed 5 reps onto raised plinth with 8" stool used and educated pt about benefits of similar stool for home use.       Knee/Hip Exercises:  Aerobic   Nustep  L6 x 6 min (B UE/LE)      Knee/Hip Exercises: Standing   Hip Flexion  Right;Left;10 reps;Limitations    Hip Flexion Limitations  Red TB; standing with B UE support    Hip ADduction  Right;Left;10 reps;Limitations    Hip ADduction Limitations  Red TB; standing with B UE support    Hip Abduction  Right;Left;10 reps    Abduction Limitations  Red TB; standing with B UE support    Hip Extension  Right;Left;10 reps    Extension Limitations  Red TB; standing with B UE support    Lateral Step Up  10 reps;Both      Knee/Hip Exercises: Seated   Sit to Sand  10 reps Red TB around knees to cue for Abd/ER      Manual Therapy   Manual Therapy  Soft tissue mobilization    Manual therapy comments  R Glute Med, Piriformis, Glute Min  - Assessment for tissue extensibility and trigger points, no significant trigger points palpated             PT  Education - 10/12/17 0934    Education Details  Pt educated on better ways to get into/out of elevated bed as pt expressed she was having difficulty with this activity and it was causing her pain in the R hip.     Person(s) Educated  Patient    Methods  Explanation;Demonstration    Comprehension  Verbalized understanding;Returned demonstration       PT Short Term Goals - 10/05/17 1233      PT SHORT TERM GOAL #1   Title  Pt will be independent with initial HEP    Status  On-going    Target Date  10/19/17      PT SHORT TERM GOAL #2   Title  Pt will report ability to carry 2 jugs of water into house with no increased pain     Status  On-going    Target Date  10/19/17        PT Long Term Goals - 10/05/17 1233      PT LONG TERM GOAL #1   Title  Pt will be independent with advanced HEP    Status  On-going    Target Date  11/09/17      PT LONG TERM GOAL #2   Title  Pt will have global hip musculature strength of 4/5 to provide greater stabilization for movement of the LE    Status  On-going    Target Date  11/09/17       PT LONG TERM GOAL #3   Title  Pt will report pain score no greater than 3/10 at worst     Status  On-going    Target Date  11/09/17      PT LONG TERM GOAL #4   Title  Pt will not report any numbness in the R LE    Status  On-going    Target Date  11/09/17            Plan - 10/12/17 1144    Clinical Impression Statement  [place] is progressing well through this episode of therapy and increases her confidence with functional activities with each visit. She expresses less pain with each visit, but still reports some numbness down in to the top of the R foot. She continues to demonstrate decreased strength and difficulty with activities challenging her ability to use R glute musculature for stabilization, and will continue to benefit from skilled physical therapy to address these deficits and improve her functional mobility.     PT Treatment/Interventions  ADLs/Self Care Home Management;Cryotherapy;Electrical Stimulation;Functional mobility training;Iontophoresis 4mg /ml Dexamethasone;Moist Heat;Manual techniques;Therapeutic activities;Therapeutic exercise;Neuromuscular re-education;Patient/family education;Passive range of motion;Dry needling;Taping    Consulted and Agree with Plan of Care  Patient       Patient will benefit from skilled therapeutic intervention in order to improve the following deficits and impairments:  Decreased strength, Pain, Increased muscle spasms, Decreased range of motion, Improper body mechanics, Impaired flexibility, Postural dysfunction  Visit Diagnosis: Pain in right hip  Muscle weakness (generalized)  Other symptoms and signs involving the nervous system  Other symptoms and signs involving the musculoskeletal system     Problem List Patient Active Problem List   Diagnosis Date Noted  . Piriformis syndrome 07/18/2017  . HTN (hypertension) 06/09/2017  . Health care maintenance 06/09/2017  . Primary insomnia 06/09/2017  . MGUS (monoclonal gammopathy  of unknown significance) 09/24/2014    Shirline Frees, SPT 10/12/2017, 12:25 PM  [facility] [address]  [address]  [place], [place], [address] Phone: [phone]   Fax:  [phone]  Name: [place] MRN: [id number] Date of Birth: 09/06/1945

## 2017-10-14 ENCOUNTER — Ambulatory Visit: Payer: Medicare Other

## 2017-10-14 DIAGNOSIS — R29898 Other symptoms and signs involving the musculoskeletal system: Secondary | ICD-10-CM | POA: Diagnosis not present

## 2017-10-14 DIAGNOSIS — M6281 Muscle weakness (generalized): Secondary | ICD-10-CM

## 2017-10-14 DIAGNOSIS — R29818 Other symptoms and signs involving the nervous system: Secondary | ICD-10-CM | POA: Diagnosis not present

## 2017-10-14 DIAGNOSIS — M25551 Pain in right hip: Secondary | ICD-10-CM

## 2017-10-14 NOTE — Therapy (Addendum)
[facility] [address]  [place], [place], [address] Phone: [phone]   Fax:  [phone]  Physical Therapy Treatment  Patient Details  Name: [place] MRN: [id number] Date of Birth: November 09, 1945 Referring Provider: Clearance Coots, MD   Encounter Date: 10/14/2017  PT End of Session - 10/14/17 0754    Visit Number  5    Number of Visits  12    Date for PT Re-Evaluation  11/16/17    PT Start Time  0750    PT Stop Time  0834    PT Time Calculation (min)  44 min    Activity Tolerance  No increased pain;Patient tolerated treatment well    Behavior During Therapy  [facility] for tasks assessed/performed       Past Medical History:  Diagnosis Date  . Hypertension   . 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 ABDOMINAL HYSTERECTOMY    . BREAST SURGERY    . EYE SURGERY    . KNEE ARTHROSCOPY      There were no vitals filed for this visit.  Subjective Assessment - 10/14/17 1215    Subjective  Pt reports she is well today and did have any increased pain in the hip when she woke up. Education received last visit about getting into/out of bed with step stool was beneficial and has not had any pain getting out of bed since purchasing a stool.      Patient is accompained by:  Family member    Currently in Pain?  No/denies                       [facility] Adult PT Treatment/Exercise - 10/14/17 0001      Lumbar Exercises: Supine   Glut Set  10 reps;5 seconds    Glut Set Limitations  With B heels on peanut ball      Knee/Hip Exercises: Aerobic   Nustep  L6 x 6 min (B UE/LE)      Knee/Hip Exercises: Standing   Lateral Step Up  10 reps;Both;Step Height: 8";Hand Hold: 0    Functional Squat  15 reps    Functional Squat Limitations  With TRX cables for balance    Rocker Board  4 minutes    Rocker Board Limitations  4 reps of 1 min  each with 1 UE support. Pt able to maintain balance with no Ue support for up to 10 seconds at a time.     SLS with Vectors  SLS with each LE standing on Therapad; 5 times to 3 discs; 1 UE assist for balance      Knee/Hip Exercises: Supine   Other Supine Knee/Hip Exercises  Bridge with hamstring curl, bilat LEs on peanut ball; 10 reps. Pt maintained good form for 4 reps then required VC from therapist for glute contraction to maintain bridge      Knee/Hip Exercises: Sidelying   Other Sidelying Knee/Hip Exercises  Side planks on R; 10 reps with 3 second hold               PT Short Term Goals - 10/05/17 1233      PT SHORT TERM GOAL #1   Title  Pt will be independent with initial HEP    Status  On-going    Target Date  10/19/17      PT SHORT TERM GOAL #2  Title  Pt will report ability to carry 2 jugs of water into house with no increased pain     Status  On-going    Target Date  10/19/17        PT Long Term Goals - 10/05/17 1233      PT LONG TERM GOAL #1   Title  Pt will be independent with advanced HEP    Status  On-going    Target Date  11/09/17      PT LONG TERM GOAL #2   Title  Pt will have global hip musculature strength of 4/5 to provide greater stabilization for movement of the LE    Status  On-going    Target Date  11/09/17      PT LONG TERM GOAL #3   Title  Pt will report pain score no greater than 3/10 at worst     Status  On-going    Target Date  11/09/17      PT LONG TERM GOAL #4   Title  Pt will not report any numbness in the R LE    Status  On-going    Target Date  11/09/17            Plan - 10/14/17 0840    Clinical Impression Statement  Today's session focused on progressing therapeutic exercises to challenge pt's ability to use glute musculature isometrically for hip stabilization during activities. Pt demonstrated difficulty with maintaining balance on rockerboard secondary to impaired motor control and poor hip strategy and would lean  to edge of sway envelope anteriorly and to the R.  With VC and education of importance of hip musculature for activity, pt maintained balance for longer periods of time with more stability. Pt will continue to benefit from physical therapy to address strength and motor control deficits, continue to improve functional mobility, and progress toward functional goals.    Rehab Potential  Good    PT Treatment/Interventions  ADLs/Self Care Home Management;Cryotherapy;Electrical Stimulation;Functional mobility training;Iontophoresis 4mg /ml Dexamethasone;Moist Heat;Manual techniques;Therapeutic activities;Therapeutic exercise;Neuromuscular re-education;Patient/family education;Passive range of motion;Dry needling;Taping    Consulted and Agree with Plan of Care  Patient       Patient will benefit from skilled therapeutic intervention in order to improve the following deficits and impairments:  Decreased strength, Pain, Increased muscle spasms, Decreased range of motion, Improper body mechanics, Impaired flexibility, Postural dysfunction  Visit Diagnosis: Pain in right hip  Muscle weakness (generalized)  Other symptoms and signs involving the nervous system  Other symptoms and signs involving the musculoskeletal system     Problem List Patient Active Problem List   Diagnosis Date Noted  . Piriformis syndrome 07/18/2017  . HTN (hypertension) 06/09/2017  . Health care maintenance 06/09/2017  . Primary insomnia 06/09/2017  . MGUS (monoclonal gammopathy of unknown significance) 09/24/2014    Shirline Frees, SPT 10/14/2017, 12:17 PM  [facility] [address]  [place], [place], [address] Phone: [phone]   Fax:  [phone]  Name: [place] MRN: [id number] Date of Birth: 1945/07/10

## 2017-10-19 ENCOUNTER — Encounter: Payer: Medicare Other | Admitting: Physical Therapy

## 2017-10-21 ENCOUNTER — Ambulatory Visit: Payer: Medicare Other | Attending: Family Medicine | Admitting: Physical Therapy

## 2017-10-21 ENCOUNTER — Encounter: Payer: Self-pay | Admitting: Physical Therapy

## 2017-10-21 DIAGNOSIS — R29898 Other symptoms and signs involving the musculoskeletal system: Secondary | ICD-10-CM | POA: Insufficient documentation

## 2017-10-21 DIAGNOSIS — M25551 Pain in right hip: Secondary | ICD-10-CM | POA: Diagnosis not present

## 2017-10-21 DIAGNOSIS — R29818 Other symptoms and signs involving the nervous system: Secondary | ICD-10-CM | POA: Insufficient documentation

## 2017-10-21 DIAGNOSIS — M6281 Muscle weakness (generalized): Secondary | ICD-10-CM | POA: Diagnosis not present

## 2017-10-21 NOTE — Therapy (Addendum)
[facility] [address]  [place], [place], [address] Phone: [phone]   Fax:  [phone]  Physical Therapy Treatment  Patient Details  Name: [place] MRN: [id number] Date of Birth: 05/11/1945 Referring Provider: Clearance Coots, MD   Encounter Date: 10/21/2017  PT End of Session - 10/21/17 0753    Visit Number  6    Number of Visits  12    Date for PT Re-Evaluation  11/16/17    PT Start Time  0752    PT Stop Time  0837    PT Time Calculation (min)  45 min    Activity Tolerance  No increased pain;Patient tolerated treatment well    Behavior During Therapy  [facility] for tasks assessed/performed       Past Medical History:  Diagnosis Date  . Hypertension   . 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 ABDOMINAL HYSTERECTOMY    . BREAST SURGERY    . EYE SURGERY    . KNEE ARTHROSCOPY      There were no vitals filed for this visit.  Subjective Assessment - 10/21/17 0754    Subjective  Pt reports that she is feeling good today and states she would like for today to be her last visit. States she has been having less frequency of pain and less numbness/tingling in the legs. Recently went on a trip where she was required to sit on a bus for long periods of time (>90 minutes) and reports no difficulty with this.     Patient is accompained by:  Family member Daughter in law    Limitations  Sitting    How long can you sit comfortably?  2-3 hours at time    How long can you stand comfortably?  45 min - 1 hour    How long can you walk comfortably?  No limitation    Patient Stated Goals  Wants to be able to lift a gallon of water    Currently in Pain?  No/denies         [facility] PT Assessment - 10/21/17 0001      Observation/Other Assessments   Focus on Therapeutic Outcomes (FOTO)   R hip - status 82% (limitation 18%)      Strength    Right Hip Flexion  4+/5    Right Hip External Rotation   4+/5    Right Hip Internal Rotation  4/5    Right Hip ABduction  4-/5    Right Hip ADduction  4-/5    Left Hip Flexion  4+/5    Left Hip Extension  4-/5    Left Hip External Rotation  4/5    Left Hip Internal Rotation  4/5    Left Hip ABduction  4/5    Left Hip ADduction  4-/5    Right/Left Knee  Right;Left    Right Knee Flexion  5/5    Right Knee Extension  5/5    Left Knee Flexion  5/5    Left Knee Extension  5/5                   [facility] Adult PT Treatment/Exercise - 10/21/17 0001      Self-Care   Self-Care  ADL's;Other Self-Care Comments    Other Self-Care Comments   Pt instructed on maintaining HEP program at home to continue to address remaining deficits and continue to  decrease pain      Lumbar Exercises: Supine   Bridge  10 reps;Other (comment)    Bridge Limitations  With 5 second hold    Other Supine Lumbar Exercises  Sciatic nerve glides; hip at 90 degrees; 20 reps of plantarflexion and dorsiflexion with added cervical flexion for progression as tolerated        Knee/Hip Exercises: Standing   Functional Squat  Other (comment)    Functional Squat Limitations  12 reps; VC and repeated demonstration for proper form    Other Standing Knee Exercises  Lateral band walks with Red TB around feet; 15 reps in each direction. VC and demo for proper form             PT Education - 10/21/17 1119    Education Details  Review of pt HEP and addition of a few exercises    Methods  Explanation;Demonstration    Comprehension  Verbalized understanding;Returned demonstration       PT Short Term Goals - 10/21/17 1118      PT SHORT TERM GOAL #1   Title  Pt will be independent with initial HEP    Status  Achieved      PT SHORT TERM GOAL #2   Title  Pt will report ability to carry 2 jugs of water into house with no increased pain     Status  Unable to assess        PT Long Term Goals - 10/21/17 0800       PT LONG TERM GOAL #1   Title  Pt will be independent with advanced HEP    Status  Achieved      PT LONG TERM GOAL #2   Title  Pt will have global hip musculature strength of 4/5 to provide greater stabilization for movement of the LE    Status  On-going      PT LONG TERM GOAL #3   Title  Pt will report pain score no greater than 3/10 at worst     Status  Achieved      PT LONG TERM GOAL #4   Title  Pt will not report any numbness in the R LE    Status  On-going            Plan - 10/21/17 3893    Clinical Impression Statement  Pt tolerated treatment session well today and has met or partially met all of her functional goals. She expressed decreased pain and decreased frequency and intensity of numbness/tingling sensation and improved functional mobility at home. She has remaining strength deficits that were discussed and she will continue to address with adherence to HEP program. She reports that she has implemented new postural strategies when emptying the dishwasher and laundry machine that have helped her to decrease pain when performing these activities. At this time pt will be placed on a 30 day hold and instructed to call with any questions or if she experiences a significant change in functional status. Pt is in agreement with this plan.     Clinical Presentation  Stable    Rehab Potential  Good    PT Treatment/Interventions  ADLs/Self Care Home Management;Cryotherapy;Electrical Stimulation;Functional mobility training;Iontophoresis 62m/ml Dexamethasone;Moist Heat;Manual techniques;Therapeutic activities;Therapeutic exercise;Neuromuscular re-education;Patient/family education;Passive range of motion;Dry needling;Taping    Consulted and Agree with Plan of Care  Patient       Patient will benefit from skilled therapeutic intervention in order to improve the following deficits and impairments:  Decreased strength,  Pain, Increased muscle spasms, Decreased range of motion, Improper  body mechanics, Impaired flexibility, Postural dysfunction  Visit Diagnosis: Pain in right hip  Muscle weakness (generalized)  Other symptoms and signs involving the nervous system  Other symptoms and signs involving the musculoskeletal system     Problem List Patient Active Problem List   Diagnosis Date Noted  . Piriformis syndrome 07/18/2017  . HTN (hypertension) 06/09/2017  . Health care maintenance 06/09/2017  . Primary insomnia 06/09/2017  . MGUS (monoclonal gammopathy of unknown significance) 09/24/2014    Shirline Frees, SPT 10/21/2017, 11:22 AM  [facility] [address]  [place], [place], [address] Phone: [phone]   Fax:  [phone]  Name: [place] MRN: [id number] Date of Birth: June 30, 1945  PHYSICAL THERAPY DISCHARGE SUMMARY  Visits from Start of Care: 6  Current functional level related to goals / functional outcomes:   Refer to above clinical impression for status as of last visit on 10/21/17. Pt was placed on hold for 30 days and has not needed to return to PT, therefore will proceed with discharge from PT for this episode.   Remaining deficits:   As above.   Education / Equipment:   HEP  Plan: Patient agrees to discharge.  Patient goals were not met. Patient is being discharged due to meeting the stated rehab goals.  ?????     Percival Spanish, PT, MPT 11/25/17, 11:34 AM  [facility] [place], [place], [address] Phone: [phone]   Fax:  [phone]

## 2017-10-26 ENCOUNTER — Encounter: Payer: Medicare Other | Admitting: Physical Therapy

## 2017-10-28 ENCOUNTER — Encounter: Payer: Medicare Other | Admitting: Physical Therapy

## 2017-11-08 ENCOUNTER — Ambulatory Visit (INDEPENDENT_AMBULATORY_CARE_PROVIDER_SITE_OTHER): Payer: Medicare Other | Admitting: Family Medicine

## 2017-11-08 ENCOUNTER — Encounter: Payer: Self-pay | Admitting: Family Medicine

## 2017-11-08 DIAGNOSIS — G5701 Lesion of sciatic nerve, right lower limb: Secondary | ICD-10-CM | POA: Diagnosis not present

## 2017-11-08 NOTE — Patient Instructions (Signed)
Good to see you  Please continue the exercises  Please take the tylenol  Please follow up with me in one month.

## 2017-11-08 NOTE — Progress Notes (Signed)
[place] - 72 y.o. female MRN [id number]  Date of birth: [birth year]  SUBJECTIVE:  Including CC & ROS.  Chief Complaint  Patient presents with  . Follow-up    [place] is a 72 y.o. female that is here today for right piriformis follow up. She has completed physical therapy, she was in sessions twice a week for one month. Pain has improved. She will have intermittent tingling in her right foot while she is sitting. She has been taking nortriptyline daily. She still has pain when she sits for long periods of time. The pain is located in the right buttock. She has an altered sensation in the right lateral lower leg and dorsal right foot.   Independent review of the lumbar spine 4/29 shows degenerative facet of the lumbar spine.   Review of Systems  Constitutional: Negative for fever.  HENT: Negative for congestion.   Respiratory: Negative for cough.   Cardiovascular: Negative for chest pain.  Gastrointestinal: Negative for abdominal pain.  Musculoskeletal: Negative for gait problem.  Skin: Negative for color change.  Neurological: Negative for weakness.  Hematological: Negative for adenopathy.  Psychiatric/Behavioral: Negative for agitation.    HISTORY: Past Medical, Surgical, Social, and Family History Reviewed & Updated per EMR.   Pertinent Historical Findings include:  Past Medical History:  Diagnosis Date  . Hypertension   . 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 ABDOMINAL HYSTERECTOMY    . BREAST SURGERY    . EYE SURGERY    . KNEE ARTHROSCOPY      Allergies  Allergen Reactions  . Celecoxib Other (See Comments)    GI upset  . Esomeprazole Other (See Comments)    Joint pain  . Feldene [Piroxicam] Other (See Comments)    abd "burning"  . Chlorthalidone Palpitations    No family history on file.   Social History   Socioeconomic History  . Marital status: Divorced   Spouse name: Not on file  . Number of children: Not on file  . Years of education: Not on file  . Highest education level: Not on file  Occupational History  . Not on file  Social Needs  . Financial resource strain: Not on file  . Food insecurity:    Worry: Not on file    Inability: Not on file  . Transportation needs:    Medical: Not on file    Non-medical: Not on file  Tobacco Use  . Smoking status: Never Smoker  . Smokeless tobacco: Never Used  . Tobacco comment: NEVER USED TOBACCO  Substance and Sexual Activity  . Alcohol use: No    Alcohol/week: 0.0 standard drinks  . Drug use: No  . Sexual activity: Not on file  Lifestyle  . Physical activity:    Days per week: Not on file    Minutes per session: Not on file  . Stress: Not on file  Relationships  . Social connections:    Talks on phone: Not on file    Gets together: Not on file    Attends religious service: Not on file    Active member of club or organization: Not on file    Attends meetings of clubs or organizations: Not on file    Relationship status: Not on file  . Intimate partner violence:    Fear of current or ex partner: Not on file    Emotionally abused: Not on file    Physically  abused: Not on file    Forced sexual activity: Not on file  Other Topics Concern  . Not on file  Social History Narrative  . Not on file     PHYSICAL EXAM:  VS: BP 138/67 (BP Location: Left Arm, Patient Position: Sitting, Cuff Size: Normal)   Pulse 98   Ht 5\' 6"  (1.676 m)   Wt 187 lb (84.8 kg)   SpO2 98%   BMI 30.18 kg/m  Physical Exam Gen: NAD, alert, cooperative with exam, well-appearing ENT: normal lips, normal nasal mucosa,  Eye: normal EOM, normal conjunctiva and lids CV:  no edema, +2 pedal pulses   Resp: no accessory muscle use, non-labored,   Skin: no rashes, no areas of induration  Neuro: normal tone, normal sensation to touch Psych:  normal insight, alert and oriented MSK:  Right hip/back:  No TTP of  the GT  Normal IR and ER  Normal strength to resistance with hip flexion, knee flexion and extension  Normal gait  Negative SLR b/l  Neurovascularly intact.       ASSESSMENT & PLAN:   Piriformis syndrome Has had improvement with PT. Pain still occurring in buttock. Possible to be piriformis or radicular. Doesn't appear to be related to hip joint. Symptoms down leg seem to be radicular  - counseled on HEP  - counseled on supportive care  - can stop nortriptyline - if no improvement would consider MRI lumbar spine for nerve impingement.

## 2017-11-08 NOTE — Assessment & Plan Note (Signed)
Has had improvement with PT. Pain still occurring in buttock. Possible to be piriformis or radicular. Doesn't appear to be related to hip joint. Symptoms down leg seem to be radicular  - counseled on HEP  - counseled on supportive care  - can stop nortriptyline - if no improvement would consider MRI lumbar spine for nerve impingement.

## 2017-11-22 ENCOUNTER — Encounter: Payer: Self-pay | Admitting: Family Medicine

## 2017-11-22 ENCOUNTER — Ambulatory Visit (INDEPENDENT_AMBULATORY_CARE_PROVIDER_SITE_OTHER): Payer: Medicare Other | Admitting: Family Medicine

## 2017-11-22 VITALS — BP 130/78 | HR 99 | Ht 66.0 in | Wt 184.0 lb

## 2017-11-22 DIAGNOSIS — M5431 Sciatica, right side: Secondary | ICD-10-CM | POA: Diagnosis not present

## 2017-11-22 MED ORDER — PREDNISONE 5 MG PO TABS: ORAL_TABLET | ORAL | 0 refills | Status: DC

## 2017-11-22 NOTE — Patient Instructions (Signed)
Good to see you  You will get a call about the MRI and when it is and where it is  I have sent in the prednisone  I will call you with the results of the MRI once it is completed.

## 2017-11-22 NOTE — Progress Notes (Signed)
Kelly Chase - 73 y.o. female MRN [id number]  Date of birth: 1945-12-24  SUBJECTIVE:  Including CC & ROS.  Chief Complaint  Patient presents with  . Follow-up    Kelly Chase is a 72 y.o. female that is here today for right leg pain follow up. She completed physical therapy 10/21/17. She feels like her pain has worsened. She has an altered sensation in the right lateral lower leg and dorsal right foot. Pain is radiating down the lateral aspect of her right leg.  She has been taking Tylenol for the pain. She has not been doing home exercises consistently. Pain is worse when sitting.   Independent review of the lumbar x-ray from 4/29 shows mild degenerative facet disease.  Review of Systems  Constitutional: Negative for fever.  HENT: Negative for congestion.   Respiratory: Negative for cough.   Cardiovascular: Negative for chest pain.  Gastrointestinal: Negative for abdominal pain.  Musculoskeletal: Negative for gait problem.  Skin: Negative for color change.  Neurological: Negative for weakness.  Hematological: Negative for adenopathy.  Psychiatric/Behavioral: Negative for agitation.    HISTORY: Past Medical, Surgical, Social, and Family History Reviewed & Updated per EMR.   Pertinent Historical Findings include:  Past Medical History:  Diagnosis Date  . Hypertension   . 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 ABDOMINAL HYSTERECTOMY    . BREAST SURGERY    . EYE SURGERY    . KNEE ARTHROSCOPY      Allergies  Allergen Reactions  . Celecoxib Other (See Comments)    GI upset  . Esomeprazole Other (See Comments)    Joint pain  . Feldene [Piroxicam] Other (See Comments)    abd "burning"  . Chlorthalidone Palpitations    No family history on file.   Social History   Socioeconomic History  . Marital status: Divorced    Spouse name: Not on file  . Number of children: Not on file  . Years  of education: Not on file  . Highest education level: Not on file  Occupational History  . Not on file  Social Needs  . Financial resource strain: Not on file  . Food insecurity:    Worry: Not on file    Inability: Not on file  . Transportation needs:    Medical: Not on file    Non-medical: Not on file  Tobacco Use  . Smoking status: Never Smoker  . Smokeless tobacco: Never Used  . Tobacco comment: NEVER USED TOBACCO  Substance and Sexual Activity  . Alcohol use: No    Alcohol/week: 0.0 standard drinks  . Drug use: No  . Sexual activity: Not on file  Lifestyle  . Physical activity:    Days per week: Not on file    Minutes per session: Not on file  . Stress: Not on file  Relationships  . Social connections:    Talks on phone: Not on file    Gets together: Not on file    Attends religious service: Not on file    Active member of club or organization: Not on file    Attends meetings of clubs or organizations: Not on file    Relationship status: Not on file  . Intimate partner violence:    Fear of current or ex partner: Not on file    Emotionally abused: Not on file    Physically abused: Not on file    Forced sexual activity: Not  on file  Other Topics Concern  . Not on file  Social History Narrative  . Not on file     PHYSICAL EXAM:  VS: BP 130/78 (BP Location: Left Arm, Patient Position: Sitting, Cuff Size: Normal)   Pulse 99   Ht 5\' 6"  (1.676 m)   Wt 184 lb (83.5 kg)   SpO2 97%   BMI 29.70 kg/m  Physical Exam Gen: NAD, alert, cooperative with exam, well-appearing ENT: normal lips, normal nasal mucosa,  Eye: normal EOM, normal conjunctiva and lids CV:  no edema, +2 pedal pulses   Resp: no accessory muscle use, non-labored,  Skin: no rashes, no areas of induration  Neuro: normal tone, normal sensation to touch Psych:  normal insight, alert and oriented MSK:  Back/Right hip:  No TTP of the GT  Normal IR and ER  Positive SLR on right  Normal strength to  resistance with hip flexion, knee flexion and extension, plantarflexion and dorsalflexion.  Neurovascularly intact     ASSESSMENT & PLAN:   Sciatica Pain is ongoing. Has tried PT with limited improvement.  - MRI lumbar spine to evaluate nerve impingement.  - prednisone for acute flare today  - counseled on supportive care

## 2017-11-22 NOTE — Assessment & Plan Note (Signed)
Pain is ongoing. Has tried PT with limited improvement.  - MRI lumbar spine to evaluate nerve impingement.  - prednisone for acute flare today  - counseled on supportive care

## 2017-11-23 ENCOUNTER — Telehealth: Payer: Self-pay | Admitting: Family Medicine

## 2017-11-23 NOTE — Telephone Encounter (Signed)
Copied from [place] [phone]. Topic: General - Other >> Nov 23, 2017  4:26 PM Keene Breath wrote: Reason for CRM: Anderson Malta with [facility] call to get PA for MRI-LSpine, CPT Code [phone].  Stated that [place] is requiring this authorization.  Please advise.  CB# [phone]

## 2017-11-25 ENCOUNTER — Other Ambulatory Visit: Payer: Medicare Other

## 2017-11-25 NOTE — Telephone Encounter (Signed)
[employer]-pre-auth approval

## 2017-11-27 ENCOUNTER — Ambulatory Visit
Admission: RE | Admit: 2017-11-27 | Discharge: 2017-11-27 | Disposition: A | Discharge status: Home / Self Care | Payer: Medicare Other | Source: Ambulatory Visit | Attending: Family Medicine | Admitting: Family Medicine

## 2017-11-27 DIAGNOSIS — M5431 Sciatica, right side: Secondary | ICD-10-CM

## 2017-11-27 DIAGNOSIS — M48061 Spinal stenosis, lumbar region without neurogenic claudication: Secondary | ICD-10-CM | POA: Diagnosis not present

## 2017-11-29 ENCOUNTER — Telehealth: Payer: Self-pay | Admitting: Family Medicine

## 2017-11-29 DIAGNOSIS — M5431 Sciatica, right side: Secondary | ICD-10-CM

## 2017-11-29 NOTE — Telephone Encounter (Signed)
Spoke with patient about her results. Will send to neurosurgery.   Rosemarie Ax, MD [facility] 11/29/2017, 10:18 AM

## 2017-12-05 NOTE — Telephone Encounter (Signed)
Spoke with patient about her pain. She will take Aleve for the pain.   Rosemarie Ax, MD [facility] 12/05/2017, 4:56 PM

## 2017-12-05 NOTE — Telephone Encounter (Signed)
Pt called and states her appt with Neurosurgery on Thursday 12/08/17. Pt states she is hurting so bad and she needs something to get her thru. She states she completed 21 days of prednisone and once it was finished the pain came back.  [facility] DR[facility] [place], [place] AT [address] (Phone) [phone] (Fax)

## 2017-12-08 DIAGNOSIS — M7138 Other bursal cyst, other site: Secondary | ICD-10-CM | POA: Diagnosis not present

## 2017-12-16 ENCOUNTER — Other Ambulatory Visit: Payer: Self-pay | Admitting: Neurosurgery

## 2017-12-26 NOTE — Pre-Procedure Instructions (Signed)
Kelly Chase  12/26/2017      [facility] DR[facility]ORE [place], [place] - [address] AT [address] [place] [address] Phone: [phone] Fax: [phone]    Your procedure is scheduled on Thursday October 10.  Report to [facility] Admitting at 10:30 A.M.  Call this number if you have problems the morning of surgery:  [phone]   Remember:  Do not eat or drink after midnight.    Take these medicines the morning of surgery with A SIP OF WATER: amlodipine (norvasc)  7 days prior to surgery STOP taking any Aspirin(unless otherwise instructed by your surgeon), Aleve, Naproxen, Ibuprofen, Motrin, Advil, Goody's, BC's, all herbal medications, fish oil, and all vitamins     Do not wear jewelry, make-up or nail polish.  Do not wear lotions, powders, or perfumes, or deodorant.  Do not shave 48 hours prior to surgery.  Men may shave face and neck.  Do not bring valuables to the hospital.  [facility] is not responsible for any belongings or valuables.  Contacts, dentures or bridgework may not be worn into surgery.  Leave your suitcase in the car.  After surgery it may be brought to your room.  For patients admitted to the hospital, discharge time will be determined by your treatment team.  Patients discharged the day of surgery will not be allowed to drive home.   Special instructions:   [place]- Preparing For Surgery  Before surgery, you can play an important role. Because skin is not sterile, your skin needs to be as free of germs as possible. You can reduce the number of germs on your skin by washing with CHG (chlorahexidine gluconate) Soap before surgery.  CHG is an antiseptic cleaner which kills germs and bonds with the skin to continue killing germs even after washing.    Oral Hygiene is also important to reduce your risk of infection.  Remember - BRUSH YOUR TEETH THE MORNING OF SURGERY WITH YOUR REGULAR TOOTHPASTE  Please do  not use if you have an allergy to CHG or antibacterial soaps. If your skin becomes reddened/irritated stop using the CHG.  Do not shave (including legs and underarms) for at least 48 hours prior to first CHG shower. It is OK to shave your face.  Please follow these instructions carefully.   1. Shower the NIGHT BEFORE SURGERY and the MORNING OF SURGERY with CHG.   2. If you chose to wash your hair, wash your hair first as usual with your normal shampoo.  3. After you shampoo, rinse your hair and body thoroughly to remove the shampoo.  4. Use CHG as you would any other liquid soap. You can apply CHG directly to the skin and wash gently with a scrungie or a clean washcloth.   5. Apply the CHG Soap to your body ONLY FROM THE NECK DOWN.  Do not use on open wounds or open sores. Avoid contact with your eyes, ears, mouth and genitals (private parts). Wash Face and genitals (private parts)  with your normal soap.  6. Wash thoroughly, paying special attention to the area where your surgery will be performed.  7. Thoroughly rinse your body with warm water from the neck down.  8. DO NOT shower/wash with your normal soap after using and rinsing off the CHG Soap.  9. Pat yourself dry with a CLEAN TOWEL.  10. Wear CLEAN PAJAMAS to bed the night before surgery,  wear comfortable clothes the morning of surgery  11. Place CLEAN SHEETS on your bed the night of your first shower and DO NOT SLEEP WITH PETS.    Day of Surgery:  Do not apply any deodorants/lotions.  Please wear clean clothes to the hospital/surgery center.   Remember to brush your teeth WITH YOUR REGULAR TOOTHPASTE.    Please read over the following fact sheets that you were given. Coughing and Deep Breathing, MRSA Information and Surgical Site Infection Prevention

## 2017-12-27 ENCOUNTER — Other Ambulatory Visit: Payer: Self-pay

## 2017-12-27 ENCOUNTER — Encounter (HOSPITAL_COMMUNITY)
Admission: RE | Admit: 2017-12-27 | Discharge: 2017-12-27 | Disposition: A | Discharge status: Home / Self Care | Payer: Medicare Other | Source: Ambulatory Visit | Attending: Neurosurgery | Admitting: Neurosurgery

## 2017-12-27 ENCOUNTER — Encounter (HOSPITAL_COMMUNITY): Payer: Self-pay

## 2017-12-27 DIAGNOSIS — I10 Essential (primary) hypertension: Secondary | ICD-10-CM | POA: Diagnosis not present

## 2017-12-27 DIAGNOSIS — Z01818 Encounter for other preprocedural examination: Secondary | ICD-10-CM | POA: Diagnosis not present

## 2017-12-27 HISTORY — DX: Gastro-esophageal reflux disease without esophagitis: K21.9

## 2017-12-27 HISTORY — DX: Malignant (primary) neoplasm, unspecified: C80.1

## 2017-12-27 LAB — CBC
HCT: 37.6 % (ref 36.0–46.0)
HEMOGLOBIN: 11.6 g/dL — AB (ref 12.0–15.0)
MCH: 27.4 pg (ref 26.0–34.0)
MCHC: 30.9 g/dL (ref 30.0–36.0)
MCV: 88.9 fL (ref 80.0–100.0)
PLATELETS: 352 10*3/uL (ref 150–400)
RBC: 4.23 MIL/uL (ref 3.87–5.11)
RDW: 14.5 % (ref 11.5–15.5)
WBC: 5.6 10*3/uL (ref 4.0–10.5)
nRBC: 0 % (ref 0.0–0.2)

## 2017-12-27 LAB — BASIC METABOLIC PANEL
Anion gap: 6 (ref 5–15)
BUN: 15 mg/dL (ref 8–23)
CO2: 25 mmol/L (ref 22–32)
Calcium: 9.3 mg/dL (ref 8.9–10.3)
Chloride: 108 mmol/L (ref 98–111)
Creatinine, Ser: 0.95 mg/dL (ref 0.44–1.00)
GFR calc Af Amer: >60 mL/min (ref >60)
GFR, EST NON AFRICAN AMERICAN: 58 mL/min — AB (ref >60)
Glucose, Bld: 115 mg/dL — ABNORMAL HIGH (ref 70–99)
POTASSIUM: 3.7 mmol/L (ref 3.5–5.1)
SODIUM: 139 mmol/L (ref 135–145)

## 2017-12-27 LAB — SURGICAL PCR SCREEN
MRSA, PCR: NEGATIVE
STAPHYLOCOCCUS AUREUS: NEGATIVE

## 2017-12-27 NOTE — Progress Notes (Signed)
PCP - Dr. Ethelene Hal Cardiologist - denies  EKG - 12/27/2017   Patient denies shortness of breath, fever, cough and chest pain at PAT appointment   Patient verbalized understanding of instructions that were given to them at the PAT appointment. Patient was also instructed that they will need to review over the PAT instructions again at home before surgery.

## 2017-12-29 ENCOUNTER — Inpatient Hospital Stay (HOSPITAL_COMMUNITY): Payer: Medicare Other | Admitting: Certified Registered Nurse Anesthetist

## 2017-12-29 ENCOUNTER — Encounter (HOSPITAL_COMMUNITY)
Admission: RE | Disposition: A | Discharge status: Home / Self Care | Payer: Self-pay | Source: Ambulatory Visit | Attending: Neurosurgery

## 2017-12-29 ENCOUNTER — Encounter (HOSPITAL_COMMUNITY): Payer: Self-pay

## 2017-12-29 ENCOUNTER — Ambulatory Visit (HOSPITAL_COMMUNITY)
Admission: RE | Admit: 2017-12-29 | Discharge: 2017-12-30 | Disposition: A | Discharge status: Home / Self Care | Payer: Medicare Other | Source: Ambulatory Visit | Attending: Neurosurgery | Admitting: Neurosurgery

## 2017-12-29 ENCOUNTER — Inpatient Hospital Stay (HOSPITAL_COMMUNITY): Payer: Medicare Other

## 2017-12-29 DIAGNOSIS — I10 Essential (primary) hypertension: Secondary | ICD-10-CM | POA: Insufficient documentation

## 2017-12-29 DIAGNOSIS — Z981 Arthrodesis status: Secondary | ICD-10-CM | POA: Diagnosis not present

## 2017-12-29 DIAGNOSIS — Z886 Allergy status to analgesic agent status: Secondary | ICD-10-CM | POA: Diagnosis not present

## 2017-12-29 DIAGNOSIS — Z419 Encounter for procedure for purposes other than remedying health state, unspecified: Secondary | ICD-10-CM

## 2017-12-29 DIAGNOSIS — Z79899 Other long term (current) drug therapy: Secondary | ICD-10-CM | POA: Insufficient documentation

## 2017-12-29 DIAGNOSIS — M7138 Other bursal cyst, other site: Principal | ICD-10-CM | POA: Insufficient documentation

## 2017-12-29 DIAGNOSIS — K219 Gastro-esophageal reflux disease without esophagitis: Secondary | ICD-10-CM | POA: Diagnosis not present

## 2017-12-29 HISTORY — PX: LUMBAR LAMINECTOMY/DECOMPRESSION MICRODISCECTOMY: SHX5026

## 2017-12-29 SURGERY — LUMBAR LAMINECTOMY/DECOMPRESSION MICRODISCECTOMY 1 LEVEL
Anesthesia: General | Site: Spine Lumbar | Laterality: Right

## 2017-12-29 MED ORDER — SODIUM CHLORIDE 0.9% FLUSH: 3.0000 mL | INTRAVENOUS | Status: DC | PRN

## 2017-12-29 MED ORDER — ACETAMINOPHEN 325 MG PO TABS
650.0000 mg | ORAL_TABLET | ORAL | Status: DC | PRN
  Administered 2017-12-29 – 2017-12-30 (×2): 650 mg via ORAL
  Filled 2017-12-29 (×2): qty 2

## 2017-12-29 MED ORDER — LACTATED RINGERS IV SOLN
INTRAVENOUS | Status: DC
  Administered 2017-12-29 (×3): via INTRAVENOUS

## 2017-12-29 MED ORDER — EPHEDRINE 5 MG/ML INJ
INTRAVENOUS | Status: AC
  Filled 2017-12-29: qty 10

## 2017-12-29 MED ORDER — PHENYLEPHRINE 40 MCG/ML (10ML) SYRINGE FOR IV PUSH (FOR BLOOD PRESSURE SUPPORT)
PREFILLED_SYRINGE | INTRAVENOUS | Status: AC
  Filled 2017-12-29: qty 10

## 2017-12-29 MED ORDER — THROMBIN 5000 UNITS EX SOLR
CUTANEOUS | Status: DC | PRN
  Administered 2017-12-29 (×2): 5000 [IU] via TOPICAL

## 2017-12-29 MED ORDER — PROPOFOL 10 MG/ML IV BOLUS
INTRAVENOUS | Status: AC
  Filled 2017-12-29: qty 20

## 2017-12-29 MED ORDER — CEFAZOLIN SODIUM-DEXTROSE 2-4 GM/100ML-% IV SOLN
2.0000 g | INTRAVENOUS | Status: AC
  Administered 2017-12-29 (×2): 2 g via INTRAVENOUS
  Filled 2017-12-29: qty 100

## 2017-12-29 MED ORDER — INFLUENZA VAC SPLIT HIGH-DOSE 0.5 ML IM SUSY
0.5000 mL | PREFILLED_SYRINGE | INTRAMUSCULAR | Status: DC
  Filled 2017-12-29: qty 0.5

## 2017-12-29 MED ORDER — LIDOCAINE 2% (20 MG/ML) 5 ML SYRINGE
INTRAMUSCULAR | Status: AC
  Filled 2017-12-29: qty 10

## 2017-12-29 MED ORDER — PROPOFOL 10 MG/ML IV BOLUS
INTRAVENOUS | Status: DC | PRN
  Administered 2017-12-29: 140 mg via INTRAVENOUS

## 2017-12-29 MED ORDER — LIDOCAINE-EPINEPHRINE 0.5 %-1:200000 IJ SOLN
INTRAMUSCULAR | Status: AC
  Filled 2017-12-29: qty 1

## 2017-12-29 MED ORDER — FENTANYL CITRATE (PF) 100 MCG/2ML IJ SOLN
INTRAMUSCULAR | Status: DC | PRN
  Administered 2017-12-29: 100 ug via INTRAVENOUS
  Administered 2017-12-29: 50 ug via INTRAVENOUS

## 2017-12-29 MED ORDER — MEPERIDINE HCL 50 MG/ML IJ SOLN: 6.2500 mg | INTRAMUSCULAR | Status: DC | PRN

## 2017-12-29 MED ORDER — DEXAMETHASONE SODIUM PHOSPHATE 10 MG/ML IJ SOLN
INTRAMUSCULAR | Status: AC
  Filled 2017-12-29: qty 1

## 2017-12-29 MED ORDER — ROCURONIUM BROMIDE 50 MG/5ML IV SOSY
PREFILLED_SYRINGE | INTRAVENOUS | Status: AC
  Filled 2017-12-29: qty 5

## 2017-12-29 MED ORDER — FENTANYL CITRATE (PF) 100 MCG/2ML IJ SOLN: 25.0000 ug | INTRAMUSCULAR | Status: DC | PRN

## 2017-12-29 MED ORDER — POLYETHYL GLYCOL-PROPYL GLYCOL 0.4-0.3 % OP SOLN: 2.0000 [drp] | Freq: Every day | OPHTHALMIC | Status: DC | PRN

## 2017-12-29 MED ORDER — MIDAZOLAM HCL 2 MG/2ML IJ SOLN
INTRAMUSCULAR | Status: AC
  Filled 2017-12-29: qty 2

## 2017-12-29 MED ORDER — ROCURONIUM BROMIDE 50 MG/5ML IV SOSY
PREFILLED_SYRINGE | INTRAVENOUS | Status: AC
  Filled 2017-12-29: qty 10

## 2017-12-29 MED ORDER — MENTHOL 3 MG MT LOZG: 1.0000 | LOZENGE | OROMUCOSAL | Status: DC | PRN

## 2017-12-29 MED ORDER — THROMBIN 5000 UNITS EX SOLR
CUTANEOUS | Status: AC
  Filled 2017-12-29: qty 10000

## 2017-12-29 MED ORDER — CHLORHEXIDINE GLUCONATE CLOTH 2 % EX PADS: 6.0000 | MEDICATED_PAD | Freq: Once | CUTANEOUS | Status: DC

## 2017-12-29 MED ORDER — PHENYLEPHRINE 40 MCG/ML (10ML) SYRINGE FOR IV PUSH (FOR BLOOD PRESSURE SUPPORT)
PREFILLED_SYRINGE | INTRAVENOUS | Status: DC | PRN
  Administered 2017-12-29: 80 ug via INTRAVENOUS
  Administered 2017-12-29: 120 ug via INTRAVENOUS

## 2017-12-29 MED ORDER — METOPROLOL TARTRATE 5 MG/5ML IV SOLN
INTRAVENOUS | Status: AC
  Filled 2017-12-29: qty 5

## 2017-12-29 MED ORDER — AMLODIPINE BESYLATE 5 MG PO TABS
10.0000 mg | ORAL_TABLET | Freq: Every day | ORAL | Status: DC
  Administered 2017-12-30: 10 mg via ORAL
  Filled 2017-12-29: qty 2

## 2017-12-29 MED ORDER — SODIUM CHLORIDE 0.9 % IV SOLN: 250.0000 mL | INTRAVENOUS | Status: DC

## 2017-12-29 MED ORDER — SUGAMMADEX SODIUM 200 MG/2ML IV SOLN
INTRAVENOUS | Status: DC | PRN
  Administered 2017-12-29: 170 mg via INTRAVENOUS

## 2017-12-29 MED ORDER — ONDANSETRON HCL 4 MG/2ML IJ SOLN
INTRAMUSCULAR | Status: DC | PRN
  Administered 2017-12-29: 4 mg via INTRAVENOUS

## 2017-12-29 MED ORDER — 0.9 % SODIUM CHLORIDE (POUR BTL) OPTIME
TOPICAL | Status: DC | PRN
  Administered 2017-12-29: 1000 mL

## 2017-12-29 MED ORDER — ONDANSETRON HCL 4 MG/2ML IJ SOLN
INTRAMUSCULAR | Status: AC
  Filled 2017-12-29: qty 2

## 2017-12-29 MED ORDER — PROMETHAZINE HCL 25 MG/ML IJ SOLN: 6.2500 mg | INTRAMUSCULAR | Status: DC | PRN

## 2017-12-29 MED ORDER — FENTANYL CITRATE (PF) 250 MCG/5ML IJ SOLN
INTRAMUSCULAR | Status: AC
  Filled 2017-12-29: qty 5

## 2017-12-29 MED ORDER — DEXAMETHASONE SODIUM PHOSPHATE 10 MG/ML IJ SOLN
INTRAMUSCULAR | Status: DC | PRN
  Administered 2017-12-29: 10 mg via INTRAVENOUS

## 2017-12-29 MED ORDER — LACTATED RINGERS IV SOLN: INTRAVENOUS | Status: DC

## 2017-12-29 MED ORDER — ACETAMINOPHEN 650 MG RE SUPP: 650.0000 mg | RECTAL | Status: DC | PRN

## 2017-12-29 MED ORDER — LIDOCAINE-EPINEPHRINE 0.5 %-1:200000 IJ SOLN
INTRAMUSCULAR | Status: DC | PRN
  Administered 2017-12-29: 10 mL

## 2017-12-29 MED ORDER — LIDOCAINE HCL (CARDIAC) PF 100 MG/5ML IV SOSY
PREFILLED_SYRINGE | INTRAVENOUS | Status: DC | PRN
  Administered 2017-12-29: 40 mg via INTRAVENOUS

## 2017-12-29 MED ORDER — POTASSIUM CHLORIDE IN NACL 20-0.9 MEQ/L-% IV SOLN: INTRAVENOUS | Status: DC

## 2017-12-29 MED ORDER — DIPHENHYDRAMINE HCL 25 MG PO CAPS
25.0000 mg | ORAL_CAPSULE | Freq: Four times a day (QID) | ORAL | Status: DC | PRN
  Administered 2017-12-29: 25 mg via ORAL
  Filled 2017-12-29: qty 1

## 2017-12-29 MED ORDER — HEMOSTATIC AGENTS (NO CHARGE) OPTIME
TOPICAL | Status: DC | PRN
  Administered 2017-12-29: 1 via TOPICAL

## 2017-12-29 MED ORDER — LISINOPRIL 20 MG PO TABS
20.0000 mg | ORAL_TABLET | Freq: Every day | ORAL | Status: DC
  Administered 2017-12-30: 20 mg via ORAL
  Filled 2017-12-29: qty 1

## 2017-12-29 MED ORDER — ROCURONIUM BROMIDE 10 MG/ML (PF) SYRINGE
PREFILLED_SYRINGE | INTRAVENOUS | Status: DC | PRN
  Administered 2017-12-29: 50 mg via INTRAVENOUS

## 2017-12-29 MED ORDER — SUCCINYLCHOLINE CHLORIDE 200 MG/10ML IV SOSY
PREFILLED_SYRINGE | INTRAVENOUS | Status: AC
  Filled 2017-12-29: qty 10

## 2017-12-29 MED ORDER — ALUM & MAG HYDROXIDE-SIMETH 200-200-20 MG/5ML PO SUSP: 20.0000 mL | Freq: Four times a day (QID) | ORAL | Status: DC | PRN

## 2017-12-29 MED ORDER — PHENOL 1.4 % MT LIQD: 1.0000 | OROMUCOSAL | Status: DC | PRN

## 2017-12-29 MED ORDER — ONDANSETRON HCL 4 MG/2ML IJ SOLN: 4.0000 mg | Freq: Four times a day (QID) | INTRAMUSCULAR | Status: DC | PRN

## 2017-12-29 MED ORDER — SODIUM CHLORIDE 0.9% FLUSH
3.0000 mL | Freq: Two times a day (BID) | INTRAVENOUS | Status: DC
  Administered 2017-12-29: 3 mL via INTRAVENOUS

## 2017-12-29 MED ORDER — ONDANSETRON HCL 4 MG PO TABS: 4.0000 mg | ORAL_TABLET | Freq: Four times a day (QID) | ORAL | Status: DC | PRN

## 2017-12-29 SURGICAL SUPPLY — 50 items
BENZOIN TINCTURE PRP APPL 2/3 (GAUZE/BANDAGES/DRESSINGS) IMPLANT
BLADE CLIPPER SURG (BLADE) IMPLANT
BUR MATCHSTICK NEURO 3.0 LAGG (BURR) ×3 IMPLANT
BUR PRECISION FLUTE 5.0 (BURR) ×3 IMPLANT
CANISTER SUCT 3000ML PPV (MISCELLANEOUS) ×3 IMPLANT
CARTRIDGE OIL MAESTRO DRILL (MISCELLANEOUS) ×1 IMPLANT
CLOSURE WOUND 1/2 X4 (GAUZE/BANDAGES/DRESSINGS)
COVER WAND RF STERILE (DRAPES) ×3 IMPLANT
DECANTER SPIKE VIAL GLASS SM (MISCELLANEOUS) ×3 IMPLANT
DERMABOND ADVANCED (GAUZE/BANDAGES/DRESSINGS) ×2
DERMABOND ADVANCED .7 DNX12 (GAUZE/BANDAGES/DRESSINGS) ×1 IMPLANT
DIFFUSER DRILL AIR PNEUMATIC (MISCELLANEOUS) ×3 IMPLANT
DRAPE LAPAROTOMY 100X72X124 (DRAPES) ×3 IMPLANT
DRAPE MICROSCOPE LEICA (MISCELLANEOUS) ×3 IMPLANT
DRAPE SURG 17X23 STRL (DRAPES) ×3 IMPLANT
DURAPREP 26ML APPLICATOR (WOUND CARE) ×3 IMPLANT
ELECT REM PT RETURN 9FT ADLT (ELECTROSURGICAL) ×3
ELECTRODE REM PT RTRN 9FT ADLT (ELECTROSURGICAL) ×1 IMPLANT
GAUZE 4X4 16PLY RFD (DISPOSABLE) IMPLANT
GAUZE SPONGE 4X4 12PLY STRL (GAUZE/BANDAGES/DRESSINGS) IMPLANT
GLOVE BIO SURGEON STRL SZ8 (GLOVE) ×6 IMPLANT
GLOVE BIO SURGEON STRL SZ8.5 (GLOVE) ×3 IMPLANT
GLOVE BIOGEL PI IND STRL 6.5 (GLOVE) ×4 IMPLANT
GLOVE BIOGEL PI INDICATOR 6.5 (GLOVE) ×8
GLOVE ECLIPSE 6.5 STRL STRAW (GLOVE) ×6 IMPLANT
GLOVE SS N UNI LF 6.5 STRL (GLOVE) ×15 IMPLANT
GOWN STRL REUS W/ TWL LRG LVL3 (GOWN DISPOSABLE) ×3 IMPLANT
GOWN STRL REUS W/ TWL XL LVL3 (GOWN DISPOSABLE) ×1 IMPLANT
GOWN STRL REUS W/TWL 2XL LVL3 (GOWN DISPOSABLE) IMPLANT
GOWN STRL REUS W/TWL LRG LVL3 (GOWN DISPOSABLE) ×6
GOWN STRL REUS W/TWL XL LVL3 (GOWN DISPOSABLE) ×2
KIT BASIN OR (CUSTOM PROCEDURE TRAY) ×3 IMPLANT
KIT TURNOVER KIT B (KITS) ×3 IMPLANT
NEEDLE HYPO 25X1 1.5 SAFETY (NEEDLE) ×3 IMPLANT
NEEDLE SPNL 18GX3.5 QUINCKE PK (NEEDLE) ×3 IMPLANT
NS IRRIG 1000ML POUR BTL (IV SOLUTION) ×3 IMPLANT
OIL CARTRIDGE MAESTRO DRILL (MISCELLANEOUS) ×3
PACK LAMINECTOMY NEURO (CUSTOM PROCEDURE TRAY) ×3 IMPLANT
PAD ARMBOARD 7.5X6 YLW CONV (MISCELLANEOUS) ×9 IMPLANT
RUBBERBAND STERILE (MISCELLANEOUS) ×6 IMPLANT
SPONGE LAP 4X18 RFD (DISPOSABLE) IMPLANT
SPONGE SURGIFOAM ABS GEL SZ50 (HEMOSTASIS) ×3 IMPLANT
STRIP CLOSURE SKIN 1/2X4 (GAUZE/BANDAGES/DRESSINGS) IMPLANT
SUT VIC AB 0 CT1 18XCR BRD8 (SUTURE) ×1 IMPLANT
SUT VIC AB 0 CT1 8-18 (SUTURE) ×2
SUT VIC AB 2-0 CT1 18 (SUTURE) ×3 IMPLANT
SUT VIC AB 3-0 SH 8-18 (SUTURE) ×3 IMPLANT
TOWEL GREEN STERILE (TOWEL DISPOSABLE) ×3 IMPLANT
TOWEL GREEN STERILE FF (TOWEL DISPOSABLE) ×3 IMPLANT
WATER STERILE IRR 1000ML POUR (IV SOLUTION) ×3 IMPLANT

## 2017-12-29 NOTE — Op Note (Signed)
12/29/2017  5:01 PM  PATIENT:  Kelly Chase  72 y.o. 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 She was positioned prone on the Wilson frame with all pressure points properly padded. Her lumbar region was prepped and draped in a sterile manner. I infiltrated lidocaine with epinephrine into the planned incision. I opened the skin with a 10 blade and dissected sharply to the thoracolumbar fascia. I exposed the lamina of L4,and L5 on the right side. I confirmed my location with an Xray. I used the drill and Kerrison punches to perform a semihemilaminectomy of L4 on the right side. I removed the ligamentum flavum and exposed the thecal sac. With blunt right angle dissectors and microdissection Dr. Arnoldo Morale and I identified the L4 pedicle, the L4/5 neural foramen, and the disc space. I removed some soft tissue from the lateral theca. I did undercut the facet, and believe in that act that the cyst was dealt with. There were no obstructions to the neural elements at the end of the case. I then irrigated and closed the wound in layers approximating the thoracolumbar fascia and the subcutaneous and subcuticular planes with vicryl sutures. I used Dermabond for a sterile dressing.   PLAN OF CARE: Admit for overnight  observation  PATIENT DISPOSITION:  PACU - hemodynamically stable.   Delay start of Pharmacological VTE agent (>24hrs) due to surgical blood loss or risk of bleeding:  yes

## 2017-12-29 NOTE — Anesthesia Postprocedure Evaluation (Signed)
Anesthesia Post Note  Patient: Kelly Chase  Procedure(s) Performed: Right Lumbar Four-Five Laminectomy for facet/synovial cyst (Right Spine Lumbar)     Patient location during evaluation: PACU Anesthesia Type: General Level of consciousness: awake and alert Pain management: pain level controlled Vital Signs Assessment: post-procedure vital signs reviewed and stable Respiratory status: spontaneous breathing, nonlabored ventilation, respiratory function stable and patient connected to nasal cannula oxygen Cardiovascular status: blood pressure returned to baseline and stable Postop Assessment: no apparent nausea or vomiting Anesthetic complications: no    Last Vitals:  Vitals:   12/29/17 1719 12/29/17 1739  BP:  (!) 142/69  Pulse:  63  Resp:  16  Temp: 36.4 C 36.4 C  SpO2:  99%    Last Pain:  Vitals:   12/29/17 1745  TempSrc:   PainSc: 0-No pain                 Effie Berkshire

## 2017-12-29 NOTE — Anesthesia Procedure Notes (Signed)

## 2017-12-29 NOTE — H&P (Signed)
  BP (!) 144/68   Pulse 84   Temp 98.3 F (36.8 C) (Oral)   Resp 18   Ht 5\' 6"  (1.676 m)   Wt 83.5 kg   SpO2 97%   BMI 29.70 kg/m    Kelly Chase comes in today for evaluation of pain that starts in the buttocks on the right, radiates posterior thigh, lateral calf, into the foot which causes numbness.  This has been ongoing since June 27, 2017.  She just remembers the date, but there was nothing traumatic that occurred that day.  She awoke with the pain.  The pain has not relented and it feels like a deep ache, in her words.  It is not improved.  She has had physical therapy.  She has had medication.  She has taken prednisone again, all without relief.   VITAL SIGNS: She weighs 185.8 pounds.  Temperature is 96.9, blood pressure 137/85, pulse is 85.   ALLERGIES: Feldene, Chlorthalidone and Celebrex.  Celebrex is only causing an upset stomach.  Feldene makes her abdomen burn.   MEDICATIONS: She takes Lisinopril, Amlodipine, and Mylanta.      Mother and father are deceased.      She has weakness in the right hip.  She has numbness in the right foot.   REVIEW OF SYSTEMS: Positive for leg pain with walking and at rest.  Pain is in the hamstrings, buttocks, and calf.   PHYSICAL EXAMINATION: She is alert, oriented x4.  She answers all questions appropriately.  Memory, language, attention span, and fund of knowledge are normal.  Speech is clear, it is also fluent.  Hearing intact to voice.  Uvula elevates midline.  Shoulder shrug is normal.  Tongue protrudes in the midline. 5/5 strength in the lower extremities.  Mildly antalgic gait on the right side.  Normal muscle tone, bulk, and coordination.  Romberg is negative.  Proprioception is intact.    MRI shows a synovial cyst on the right side at L4-5.  It is compromising the L4 root, probably some of the L5 but certainly L4 root in the foramen and lateral recess.    Given the fact that she is in pain and she says she has had enough at this  point in time, I have offered her operative decompression of the synovial cyst.  She would like to proceed with that and we will get her scheduled.  Risks and benefits of bleeding, infection, no relief, need for further surgery, nerve root damage were discussed.  She understands and wishes to proceed.

## 2017-12-29 NOTE — Anesthesia Preprocedure Evaluation (Addendum)
Anesthesia Evaluation  Patient identified by MRN, date of birth, ID band Patient awake    Reviewed: Allergy & Precautions, NPO status , Patient's Chart, lab work & pertinent test results  Airway Mallampati: II  TM Distance: >3 FB Neck ROM: Full    Dental  (+) Chipped, Dental Advisory Given,    Pulmonary neg pulmonary ROS,    breath sounds clear to auscultation       Cardiovascular hypertension, Pt. on medications  Rhythm:Regular Rate:Normal     Neuro/Psych    GI/Hepatic Neg liver ROS, GERD  ,  Endo/Other  negative endocrine ROS  Renal/GU negative Renal ROS     Musculoskeletal negative musculoskeletal ROS (+)   Abdominal Normal abdominal exam  (+)   Peds  Hematology negative hematology ROS (+)   Anesthesia Other Findings - MGUS  Reproductive/Obstetrics                            Lab Results  Component Value Date   WBC 5.6 12/27/2017   HGB 11.6 (L) 12/27/2017   HCT 37.6 12/27/2017   MCV 88.9 12/27/2017   PLT 352 12/27/2017   Lab Results  Component Value Date   CREATININE 0.95 12/27/2017   BUN 15 12/27/2017   NA 139 12/27/2017   K 3.7 12/27/2017   CL 108 12/27/2017   CO2 25 12/27/2017   No results found for: INR, PROTIME  EKG: normal sinus rhythm.  Anesthesia Physical Anesthesia Plan  ASA: II  Anesthesia Plan: General   Post-op Pain Management:    Induction: Intravenous  PONV Risk Score and Plan: 4 or greater and Ondansetron, Dexamethasone and Treatment may vary due to age or medical condition  Airway Management Planned: Oral ETT  Additional Equipment: None  Intra-op Plan:   Post-operative Plan: Extubation in OR  Informed Consent: I have reviewed the patients History and Physical, chart, labs and discussed the procedure including the risks, benefits and alternatives for the proposed anesthesia with the patient or authorized representative who has indicated his/her  understanding and acceptance.   Dental advisory given  Plan Discussed with: CRNA  Anesthesia Plan Comments:        Anesthesia Quick Evaluation

## 2017-12-29 NOTE — Transfer of Care (Signed)
Immediate Anesthesia Transfer of Care Note  Patient: Kelly Chase  Procedure(s) Performed: Right Lumbar Four-Five Laminectomy for facet/synovial cyst (Right Spine Lumbar)  Patient Location: PACU  Anesthesia Type:General  Level of Consciousness: awake, alert  and oriented  Airway & Oxygen Therapy: Patient Spontanous Breathing  Post-op Assessment: Report given to RN, Post -op Vital signs reviewed and stable and Patient moving all extremities  Post vital signs: Reviewed and stable  Last Vitals:  Vitals Value Taken Time  BP    Temp    Pulse 79 12/29/2017  4:35 PM  Resp 23 12/29/2017  4:35 PM  SpO2 99 % 12/29/2017  4:35 PM  Vitals shown include unvalidated device data.  Last Pain:  Vitals:   12/29/17 1055  TempSrc:   PainSc: 2       Patients Stated Pain Goal: 2 (69/24/93 2419)  Complications: No apparent anesthesia complications

## 2017-12-30 ENCOUNTER — Encounter (HOSPITAL_COMMUNITY): Payer: Self-pay | Admitting: Neurosurgery

## 2017-12-30 DIAGNOSIS — M7138 Other bursal cyst, other site: Secondary | ICD-10-CM | POA: Diagnosis not present

## 2017-12-30 MED ORDER — TIZANIDINE HCL 4 MG PO TABS: 4.0000 mg | ORAL_TABLET | Freq: Four times a day (QID) | ORAL | 0 refills | Status: DC | PRN

## 2017-12-30 MED ORDER — HYDROCODONE-ACETAMINOPHEN 5-325 MG PO TABS: 1.0000 | ORAL_TABLET | Freq: Four times a day (QID) | ORAL | 0 refills | Status: DC | PRN

## 2017-12-30 NOTE — Discharge Summary (Signed)
Physician Discharge Summary  Patient ID: Kelly Chase MRN: [id number] DOB/AGE: Apr 24, 1945 72 y.o.  Admit date: 12/29/2017 Discharge date: 12/30/2017  Admission Diagnoses:SYNOVIAL CYST LUMBAR SPINE, l4/5 RIGHT  Discharge Diagnoses: same Active Problems:   Synovial cyst of lumbar spine   Discharged Condition: good  Hospital Course: Kelly Chase was admitted and taken to the operating room for an uncomplicated lumbar decompression and synovial cyst resection at L4/5 on the left. Post op she is walking, voiding, and tolerating a regular diet. Her wound is clean, dry, and without signs of infection.  Treatments: surgery: Right Lumbar Four-Five semihemilaminectomy for facet/synovial cyst  Discharge Exam: Blood pressure 137/65, pulse 89, temperature 98 F (36.7 C), temperature source Oral, resp. rate 16, height 5\' 6"  (1.676 m), weight 83.5 kg, SpO2 98 %. General appearance: alert, cooperative, appears stated age and mild distress Neurologic: Alert and oriented X 3, normal strength and tone. Normal symmetric reflexes. Normal coordination and gait  Disposition: Discharge disposition: 01-Home or Self Care      Synovial cyst of lumbar spine  Allergies as of 12/30/2017      Reactions   Celecoxib Other (See Comments)   GI upset   Esomeprazole Other (See Comments)   Joint pain   Feldene [piroxicam] Other (See Comments)   abd "burning"   Chlorthalidone Palpitations      Medication List    TAKE these medications   alum & mag hydroxide-simeth 200-200-20 MG/5ML suspension Commonly known as:  MAALOX/MYLANTA Take 20 mLs by mouth every 6 (six) hours as needed for indigestion or heartburn.   amLODipine 10 MG tablet Commonly known as:  NORVASC Take 1 tablet (10 mg total) by mouth daily.   HYDROcodone-acetaminophen 5-325 MG tablet Commonly known as:  NORCO/VICODIN Take 1 tablet by mouth every 6 (six) hours as needed for moderate pain.   lisinopril 20 MG tablet Commonly known  as:  PRINIVIL,ZESTRIL Take 1 tablet (20 mg total) by mouth daily.   naproxen sodium 220 MG tablet Commonly known as:  ALEVE Take 220 mg by mouth.   SYSTANE 0.4-0.3 % Soln Generic drug:  Polyethyl Glycol-Propyl Glycol Place 2 drops into both eyes daily as needed (for dry eyes).   tiZANidine 4 MG tablet Commonly known as:  ZANAFLEX Take 1 tablet (4 mg total) by mouth every 6 (six) hours as needed for muscle spasms.            Durable Medical Equipment  (From admission, onward)         Start     Ordered   12/30/17 1009  For home use only DME 3 n 1  Once     12/30/17 1008           Signed: Igor Bishop L 12/30/2017, 10:52 AM

## 2017-12-30 NOTE — Evaluation (Signed)
Physical Therapy Evaluation and Discharge Patient Details Name: Kelly Chase MRN: [id number] DOB: 1945/08/24 Today's Date: 12/30/2017   History of Present Illness  Pt is a 73 y/o female who presents s/p L4-L5 semihemilaminectomy for facet/synovial cyst removal on 12/29/17.  PMH significant for MGUS, HTN, CA.   Clinical Impression  Patient evaluated by Physical Therapy with no further acute PT needs identified. All education has been completed and the patient has no further questions. At the time of PT eval pt was able to perform transfers and ambulation with gross modified independence. Overall pt moving slow but is generally steady and reports minimal discomfort at surgical site. Pt was educated on precautions, car transfer, activity progression and safety with mobility around the house. See below for any follow-up Physical Therapy or equipment needs. PT is signing off. Thank you for this referral.     Follow Up Recommendations No PT follow up;Supervision - Intermittent    Equipment Recommendations  None recommended by PT    Recommendations for Other Services       Precautions / Restrictions Precautions Precautions: Back Precaution Booklet Issued: Yes (comment) Precaution Comments: Reviewed handout in detail, and pt was cued for maintenance of precautions during functional mobility.  Restrictions Weight Bearing Restrictions: No      Mobility  Bed Mobility Overal bed mobility: Modified Independent             General bed mobility comments: VC's for proper log roll technique. Pt was able to perform well with HOB flat and rails lowered to simualte home environment.   Transfers Overall transfer level: Modified independent Equipment used: None             General transfer comment: No assist required to power up to full stand. Min UE support required during transition and no unsteadiness/LOB noted.   Ambulation/Gait Ambulation/Gait assistance: Modified independent  (Device/Increase time) Gait Distance (Feet): 350 Feet Assistive device: None Gait Pattern/deviations: Step-through pattern;Decreased stride length Gait velocity: Decreased Gait velocity interpretation: 1.31 - 2.62 ft/sec, indicative of limited community ambulator General Gait Details: Slow but generally steady. Pt was able to improve gait speed with cues but as she was talking, her pace again slowed. No unsteadiness or LOB noted throughout gait training.   Stairs Stairs: Yes Stairs assistance: Modified independent (Device/Increase time) Stair Management: One rail Left;Step to pattern;Forwards Number of Stairs: 1 General stair comments: Pt completed without difficulty.   Wheelchair Mobility    Modified Rankin (Stroke Patients Only)       Balance Overall balance assessment: No apparent balance deficits (not formally assessed)                                           Pertinent Vitals/Pain Pain Assessment: Faces Faces Pain Scale: Hurts a little bit Pain Location: Incision site Pain Descriptors / Indicators: Operative site guarding;Sore Pain Intervention(s): Limited activity within patient's tolerance;Monitored during session;Repositioned    Home Living Family/patient expects to be discharged to:: Private residence Living Arrangements: Alone Available Help at Discharge: Family;Available PRN/intermittently Type of Home: House(townhome) Home Access: Stairs to enter Entrance Stairs-Rails: None Entrance Stairs-Number of Steps: 1 Home Layout: One level Home Equipment: Toilet riser;Cane - single point      Prior Function Level of Independence: Independent         Comments: 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  Hand Dominance        Extremity/Trunk Assessment   Upper Extremity Assessment Upper Extremity Assessment: Defer to OT evaluation    Lower Extremity Assessment Lower Extremity Assessment: Overall WFL  for tasks assessed    Cervical / Trunk Assessment Cervical / Trunk Assessment: Other exceptions Cervical / Trunk Exceptions: s/p surgery  Communication   Communication: No difficulties  Cognition Arousal/Alertness: Awake/alert Behavior During Therapy: WFL for tasks assessed/performed Overall Cognitive Status: Within Functional Limits for tasks assessed                                        General Comments      Exercises     Assessment/Plan    PT Assessment Patent does not need any further PT services  PT Problem List         PT Treatment Interventions      PT Goals (Current goals can be found in the Care Plan section)  Acute Rehab PT Goals Patient Stated Goal: Pt looking forward to returning home today PT Goal Formulation: All assessment and education complete, DC therapy    Frequency     Barriers to discharge        Co-evaluation               AM-PAC PT "6 Clicks" Daily Activity  Outcome Measure Difficulty turning over in bed (including adjusting bedclothes, sheets and blankets)?: None Difficulty moving from lying on back to sitting on the side of the bed? : None Difficulty sitting down on and standing up from a chair with arms (e.g., wheelchair, bedside commode, etc,.)?: None Help needed moving to and from a bed to chair (including a wheelchair)?: None Help needed walking in hospital room?: None Help needed climbing 3-5 steps with a railing? : None 6 Click Score: 24    End of Session   Activity Tolerance: Patient tolerated treatment well Patient left: in chair;with call bell/phone within reach Nurse Communication: Mobility status PT Visit Diagnosis: Pain;Difficulty in walking, not elsewhere classified (R26.2) Pain - part of body: (back)    Time: 2703-5009 PT Time Calculation (min) (ACUTE ONLY): 19 min   Charges:   PT Evaluation $PT Eval Low Complexity: [place], PT, DPT Acute Rehabilitation  Services Pager: [phone] Office: [phone]   Kelly Chase 12/30/2017, 9:39 AM

## 2017-12-30 NOTE — Discharge Instructions (Signed)
Lumbar Discectomy °Care After °A discectomy involves removal of discmaterial (the cartilage-like structures located between the bones of the back). It is done to relieve pressure on nerve roots. It can be used as a treatment for a back problem. The time in surgery depends on the findings in surgery and what is necessary to correct the problems. °HOME CARE INSTRUCTIONS  °· Check the cut (incision) made by the surgeon twice a day for signs of infection. Some signs of infection may include:  °· A foul smelling, greenish or yellowish discharge from the wound.  °· Increased pain.  °· Increased redness over the incision (operative) site.  °· The skin edges may separate.  °· Flu-like symptoms (problems).  °· A temperature above 101.5° F (38.6° C).  °· Change your bandages in about 24 to 36 hours following surgery or as directed.  °· You may shower tomrrow.  Avoid bathtubs, swimming pools and hot tubs for three weeks or until your incision has healed completely. °· Follow your doctor's instructions as to safe activities, exercises, and physical therapy.  °· Weight reduction may be beneficial if you are overweight.  °· Daily exercise is helpful to prevent the return of problems. Walking is permitted. You may use a treadmill without an incline. Cut down on activities and exercise if you have discomfort. You may also go up and down stairs as much as you can tolerate.  °· DO NOT lift anything heavier than 10 to 15 lbs. Avoid bending or twisting at the waist. Always bend your knees when lifting.  °· Maintain strength and range of motion as instructed.  °· Do not drive for 10 days, or as directed by your doctors. You may be a passenger . Lying back in the passenger seat may be more comfortable for you. Always wear a seatbelt.  °· Limit your sitting in a regular chair to 20 to 30 minutes at a time. There are no limitations for sitting in a recliner. You should lie down or walk in between sitting periods.  °· Only take  over-the-counter or prescription medicines for pain, discomfort, or fever as directed by your caregiver.  °SEEK MEDICAL CARE IF:  °· There is increased bleeding (more than a small spot) from the wound.  °· You notice redness, swelling, or increasing pain in the wound.  °· Pus is coming from wound.  °· You develop an unexplained oral temperature above 102° F (38.9° C) develops.  °· You notice a foul smell coming from the wound or dressing.  °· You have increasing pain in your wound.  °SEEK IMMEDIATE MEDICAL CARE IF:  °· You develop a rash.  °· You have difficulty breathing.  °· You develop any allergic problems to medicines given.  °Document Released: 02/11/2004 Document Revised: 02/25/2011 Document Reviewed: 06/01/2007 °ExitCare® Patient Information °

## 2017-12-30 NOTE — Progress Notes (Signed)
Patient alert and oriented, mae's well, voiding adequate amount of urine, swallowing without difficulty, no c/o pain at time of discharge. Patient discharged home with family. Script and discharged instructions given to patient. Patient and family stated understanding of instructions given. Patient has an appointment with Dr. Cabbell   

## 2017-12-30 NOTE — Evaluation (Signed)
Occupational Therapy Evaluation and Discharge Patient Details Name: Kelly Chase MRN: [id number] DOB: 1945/12/11 Today's Date: 12/30/2017    History of Present Illness Pt is a 72 y/o female who presents s/p L4-L5 semihemilaminectomy for facet/synovial cyst removal on 12/29/17.  PMH significant for MGUS, HTN, CA.    Clinical Impression   PTA Pt independent in ADL, IADL and mobility. Back handout provided and reviewed adls in detail. Pt educated on: set an alarm at night for medication, avoid sitting for long periods of time, correct bed positioning for sleeping, correct sequence for bed mobility, avoiding lifting more than 5 pounds and never wash directly over incision, AE for LB ADL (provided Pt with grabber/reacher - Pt going to buy long handle sponge), compensatory techniques for sink level grooming, and all uses of 3 in 1 (which is essential for Pt's safety and functioning in home environment). All education is complete and patient indicates understanding. OT to sign off Thank you for the opportunity to serve this patient      Follow Up Recommendations  No OT follow up    Equipment Recommendations  3 in 1 bedside commode    Recommendations for Other Services       Precautions / Restrictions Precautions Precautions: Back Precaution Booklet Issued: Yes (comment) Precaution Comments: Reviewed handout in detail, and pt was cued for maintenance of precautions during functional mobility.  Restrictions Weight Bearing Restrictions: No      Mobility Bed Mobility Overal bed mobility: Modified Independent             General bed mobility comments: Pt OOB in recliner at beginning and end of session  Transfers Overall transfer level: Modified independent Equipment used: None             General transfer comment: No assist required to power up to full stand. Min UE support required during transition and no unsteadiness/LOB noted.     Balance Overall balance assessment:  No apparent balance deficits (not formally assessed)                                         ADL either performed or assessed with clinical judgement   ADL Overall ADL's : Needs assistance/impaired Eating/Feeding: Independent;Sitting   Grooming: Wash/dry hands;Wash/dry face;Oral care;Standing;Cueing for compensatory techniques Grooming Details (indicate cue type and reason): educated on compensatory strategies (cup method for oral care, setting up on the right) Upper Body Bathing: Supervision/ safety;Sitting;Standing Upper Body Bathing Details (indicate cue type and reason): educated in long handle sponge Lower Body Bathing: Min guard;Sitting/lateral leans Lower Body Bathing Details (indicate cue type and reason): educated in long handle sponge Upper Body Dressing : Supervision/safety;Cueing for compensatory techniques;Sitting   Lower Body Dressing: Min guard;With adaptive equipment;Sit to/from stand Lower Body Dressing Details (indicate cue type and reason): Pt able to bring feet to knees for "figure 4" dressing, also educated in use of grabber/reacher for LB dressing Toilet Transfer: Modified Independent;Ambulation(BSC over toilet to make higher)   Toileting- Clothing Manipulation and Hygiene: Min guard;Sit to/from stand   Tub/ Shower Transfer: Supervision/safety;With caregiver independent assisting   Functional mobility during ADLs: Modified independent       Vision Patient Visual Report: No change from baseline       Perception     Praxis      Pertinent Vitals/Pain Pain Assessment: Faces Faces Pain Scale: Hurts a little bit Pain Location:  Incision site Pain Descriptors / Indicators: Operative site guarding;Sore Pain Intervention(s): Limited activity within patient's tolerance;Monitored during session;Repositioned     Hand Dominance Right   Extremity/Trunk Assessment Upper Extremity Assessment Upper Extremity Assessment: Overall WFL for tasks  assessed   Lower Extremity Assessment Lower Extremity Assessment: Defer to PT evaluation   Cervical / Trunk Assessment Cervical / Trunk Assessment: Other exceptions Cervical / Trunk Exceptions: s/p lumbar surgery   Communication Communication Communication: No difficulties   Cognition Arousal/Alertness: Awake/alert Behavior During Therapy: WFL for tasks assessed/performed Overall Cognitive Status: Within Functional Limits for tasks assessed                                     General Comments       Exercises     Shoulder Instructions      Home Living Family/patient expects to be discharged to:: Private residence Living Arrangements: Alone Available Help at Discharge: Family;Available PRN/intermittently Type of Home: House(townhome) Home Access: Stairs to enter Entrance Stairs-Number of Steps: 1 Entrance Stairs-Rails: None Home Layout: One level     Bathroom Shower/Tub: Teacher, early years/pre: Standard     Home Equipment: Merchant navy officer - single point          Prior Functioning/Environment Level of Independence: Independent        Comments: 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        OT Problem List:        OT Treatment/Interventions:      OT Goals(Current goals can be found in the care plan section) Acute Rehab OT Goals Patient Stated Goal: Pt looking forward to returning home today OT Goal Formulation: With patient Time For Goal Achievement: 01/13/18 Potential to Achieve Goals: Good  OT Frequency:     Barriers to D/C:            Co-evaluation              AM-PAC PT "6 Clicks" Daily Activity     Outcome Measure Help from another person eating meals?: None Help from another person taking care of personal grooming?: None Help from another person toileting, which includes using toliet, bedpan, or urinal?: None Help from another person bathing (including washing, rinsing,  drying)?: None Help from another person to put on and taking off regular upper body clothing?: None Help from another person to put on and taking off regular lower body clothing?: A Little 6 Click Score: 23   End of Session Nurse Communication: Mobility status;Other (comment)(needs 3 in 1)  Activity Tolerance: Patient tolerated treatment well Patient left: in chair;with call bell/phone within reach                   Time: 0836-0858 OT Time Calculation (min): 22 min Charges:  OT General Charges $OT Visit: 1 Visit OT Evaluation $OT Eval Low Complexity: [place] OTR/L Acute Rehabilitation Services Pager: [phone] Office: [place] 12/30/2017, 11:17 AM

## 2018-01-16 ENCOUNTER — Other Ambulatory Visit: Payer: Self-pay | Admitting: Family Medicine

## 2018-01-16 DIAGNOSIS — I10 Essential (primary) hypertension: Secondary | ICD-10-CM

## 2018-01-16 NOTE — Telephone Encounter (Signed)
Copied from [place] [phone]. Topic: Quick Communication - Rx Refill/Question >> Jan 16, 2018 12:07 PM Leward Quan A wrote: Medication: amLODipine (NORVASC) 10 MG tablet   Has the patient contacted their pharmacy? Yes.     Preferred Pharmacy (with phone number or street name): [id number], [place] [phone] (Phone) [phone] (Fax)    Agent: Please be advised that RX refills may take up to 3 business days. We ask that you follow-up with your pharmacy.

## 2018-01-17 MED ORDER — AMLODIPINE BESYLATE 10 MG PO TABS: 10.0000 mg | ORAL_TABLET | Freq: Every day | ORAL | 2 refills | Status: DC

## 2018-01-17 NOTE — Telephone Encounter (Signed)
Requested Prescriptions  Pending Prescriptions Disp Refills  . amLODipine (NORVASC) 10 MG tablet 90 tablet 2    Sig: Take 1 tablet (10 mg total) by mouth daily.     Cardiovascular:  Calcium Channel Blockers Passed - 01/16/2018  3:38 PM      Passed - Last BP in normal range    BP Readings from Last 1 Encounters:  12/30/17 137/65         Passed - Valid encounter within last 6 months    Recent Outpatient Visits          1 month ago Sciatica of right side   [facility] Radene Journey, Enid Baas, MD   2 months ago Piriformis syndrome of right side   [facility] Radene Journey, Enid Baas, MD   4 months ago Piriformis syndrome of right side   [facility] [place] Rosemarie Ax, MD   4 months ago Piriformis syndrome of right side   [facility], Mortimer Fries, MD   6 months ago Piriformis syndrome of right side   [facility] Rosemarie Ax, MD

## 2018-03-27 DIAGNOSIS — H2513 Age-related nuclear cataract, bilateral: Secondary | ICD-10-CM | POA: Diagnosis not present

## 2018-03-27 DIAGNOSIS — H35033 Hypertensive retinopathy, bilateral: Secondary | ICD-10-CM | POA: Diagnosis not present

## 2018-04-19 ENCOUNTER — Ambulatory Visit: Payer: Medicare Other | Admitting: Family Medicine

## 2018-04-20 ENCOUNTER — Ambulatory Visit: Payer: Self-pay | Admitting: *Deleted

## 2018-04-20 NOTE — Telephone Encounter (Signed)
Pt called to reschedule her appointment that she canceled in the early part of the week. She states that she has congestion in her head and is draining down into her chest. Denies blowing it out.  Says that she feels it in her throat and upper chest. Denies chest pain. No fever, earache or cough. Using a generic Flonase that helped with the drainage in her head. Appointment scheduled as per requested. Routing to flow at [employer].

## 2018-04-21 ENCOUNTER — Ambulatory Visit (INDEPENDENT_AMBULATORY_CARE_PROVIDER_SITE_OTHER): Payer: Medicare Other | Admitting: Family Medicine

## 2018-04-21 ENCOUNTER — Encounter: Payer: Self-pay | Admitting: Family Medicine

## 2018-04-21 VITALS — BP 122/80 | HR 86 | Temp 97.4°F | Ht 66.0 in | Wt 182.1 lb

## 2018-04-21 DIAGNOSIS — R0982 Postnasal drip: Secondary | ICD-10-CM | POA: Diagnosis not present

## 2018-04-21 DIAGNOSIS — F5101 Primary insomnia: Secondary | ICD-10-CM

## 2018-04-21 DIAGNOSIS — I10 Essential (primary) hypertension: Secondary | ICD-10-CM | POA: Diagnosis not present

## 2018-04-21 MED ORDER — SALINE SPRAY 0.65 % NA SOLN: 1.0000 | NASAL | 6 refills | Status: DC | PRN

## 2018-04-21 MED ORDER — PAROXETINE HCL 10 MG PO TABS: 10.0000 mg | ORAL_TABLET | Freq: Every day | ORAL | 1 refills | Status: DC

## 2018-04-21 MED ORDER — PREDNISONE 10 MG PO TABS: 10.0000 mg | ORAL_TABLET | Freq: Every day | ORAL | 0 refills | Status: AC

## 2018-04-21 MED ORDER — LISINOPRIL 20 MG PO TABS: 20.0000 mg | ORAL_TABLET | Freq: Every day | ORAL | 3 refills | Status: AC

## 2018-04-21 NOTE — Patient Instructions (Addendum)
Postnasal Drip Postnasal drip is the feeling of mucus going down the back of your throat. Mucus is a slimy substance that moistens and cleans your nose and throat, as well as the air pockets in face bones near your forehead and cheeks (sinuses). Small amounts of mucus pass from your nose and sinuses down the back of your throat all the time. This is normal. When you produce too much mucus or the mucus gets too thick, you can feel it. Some common causes of postnasal drip include:  Having more mucus because of: ? A cold or the flu. ? Allergies. ? Cold air. ? Certain medicines.  Having more mucus that is thicker because of: ? A sinus or nasal infection. ? Dry air. ? A food allergy. Follow these instructions at home: Relieving discomfort   Gargle with a salt-water mixture 3-4 times a day or as needed. To make a salt-water mixture, completely dissolve -1 tsp of salt in 1 cup of warm water.  If the air in your home is dry, use a humidifier to add moisture to the air.  Use a saline spray or container (neti pot) to flush out the nose (nasal irrigation). These methods can help clear away mucus and keep the nasal passages moist. General instructions  Take over-the-counter and prescription medicines only as told by your health care provider.  Follow instructions from your health care provider about eating or drinking restrictions. You may need to avoid caffeine.  Avoid things that you know you are allergic to (allergens), like dust, mold, pollen, pets, or certain foods.  Drink enough fluid to keep your urine pale yellow.  Keep all follow-up visits as told by your health care provider. This is important. Contact a health care provider if:  You have a fever.  You have a sore throat.  You have difficulty swallowing.  You have headache.  You have sinus pain.  You have a cough that does not go away.  The mucus from your nose becomes thick and is green or yellow in color.  You have  cold or flu symptoms that last more than 10 days. Summary  Postnasal drip is the feeling of mucus going down the back of your throat.  If your health care provider approves, use nasal irrigation or a nasal spray 2?4 times a day.  Avoid things that you know you are allergic to (allergens), like dust, mold, pollen, pets, or certain foods. This information is not intended to replace advice given to you by your health care provider. Make sure you discuss any questions you have with your health care provider. Document Released: 06/21/2016 Document Revised: 06/21/2016 Document Reviewed: 06/21/2016 Elsevier Interactive Patient Education  2019 [place].  Insomnia Insomnia is a sleep disorder that makes it difficult to fall asleep or stay asleep. Insomnia can cause fatigue, low energy, difficulty concentrating, mood swings, and poor performance at work or school. There are three different ways to classify insomnia:  Difficulty falling asleep.  Difficulty staying asleep.  Waking up too early in the morning. Any type of insomnia can be long-term (chronic) or short-term (acute). Both are common. Short-term insomnia usually lasts for three months or less. Chronic insomnia occurs at least three times a week for longer than three months. What are the causes? Insomnia may be caused by another condition, situation, or substance, such as:  Anxiety.  Certain medicines.  Gastroesophageal reflux disease (GERD) or other gastrointestinal conditions.  Asthma or other breathing conditions.  Restless legs syndrome, sleep  apnea, or other sleep disorders.  Chronic pain.  Menopause.  Stroke.  Abuse of alcohol, tobacco, or illegal drugs.  Mental health conditions, such as depression.  Caffeine.  Neurological disorders, such as Alzheimer's disease.  An overactive thyroid (hyperthyroidism). Sometimes, the cause of insomnia may not be known. What increases the risk? Risk factors for insomnia  include:  Gender. Women are affected more often than men.  Age. Insomnia is more common as you get older.  Stress.  Lack of exercise.  Irregular work schedule or working night shifts.  Traveling between different time zones.  Certain medical and mental health conditions. What are the signs or symptoms? If you have insomnia, the main symptom is having trouble falling asleep or having trouble staying asleep. This may lead to other symptoms, such as:  Feeling fatigued or having low energy.  Feeling nervous about going to sleep.  Not feeling rested in the morning.  Having trouble concentrating.  Feeling irritable, anxious, or depressed. How is this diagnosed? This condition may be diagnosed based on:  Your symptoms and medical history. Your health care provider may ask about: ? Your sleep habits. ? Any medical conditions you have. ? Your mental health.  A physical exam. How is this treated? Treatment for insomnia depends on the cause. Treatment may focus on treating an underlying condition that is causing insomnia. Treatment may also include:  Medicines to help you sleep.  Counseling or therapy.  Lifestyle adjustments to help you sleep better. Follow these instructions at home: Eating and drinking   Limit or avoid alcohol, caffeinated beverages, and cigarettes, especially close to bedtime. These can disrupt your sleep.  Do not eat a large meal or eat spicy foods right before bedtime. This can lead to digestive discomfort that can make it hard for you to sleep. Sleep habits   Keep a sleep diary to help you and your health care provider figure out what could be causing your insomnia. Write down: ? When you sleep. ? When you wake up during the night. ? How well you sleep. ? How rested you feel the next day. ? Any side effects of medicines you are taking. ? What you eat and drink.  Make your bedroom a dark, comfortable place where it is easy to fall  asleep. ? Put up shades or blackout curtains to block light from outside. ? Use a white noise machine to block noise. ? Keep the temperature cool.  Limit screen use before bedtime. This includes: ? Watching TV. ? Using your smartphone, tablet, or computer.  Stick to a routine that includes going to bed and waking up at the same times every day and night. This can help you fall asleep faster. Consider making a quiet activity, such as reading, part of your nighttime routine.  Try to avoid taking naps during the day so that you sleep better at night.  Get out of bed if you are still awake after 15 minutes of trying to sleep. Keep the lights down, but try reading or doing a quiet activity. When you feel sleepy, go back to bed. General instructions  Take over-the-counter and prescription medicines only as told by your health care provider.  Exercise regularly, as told by your health care provider. Avoid exercise starting several hours before bedtime.  Use relaxation techniques to manage stress. Ask your health care provider to suggest some techniques that may work well for you. These may include: ? Breathing exercises. ? Routines to release muscle tension. ? Visualizing  peaceful scenes.  Make sure that you drive carefully. Avoid driving if you feel very sleepy.  Keep all follow-up visits as told by your health care provider. This is important. Contact a health care provider if:  You are tired throughout the day.  You have trouble in your daily routine due to sleepiness.  You continue to have sleep problems, or your sleep problems get worse. Get help right away if:  You have serious thoughts about hurting yourself or someone else. If you ever feel like you may hurt yourself or others, or have thoughts about taking your own life, get help right away. You can go to your nearest emergency department or call:  Your local emergency services (911 in the [place]  A suicide crisis  helpline, such as the [facility] at (365)106-5619. This is open 24 hours a day. Summary  Insomnia is a sleep disorder that makes it difficult to fall asleep or stay asleep.  Insomnia can be long-term (chronic) or short-term (acute).  Treatment for insomnia depends on the cause. Treatment may focus on treating an underlying condition that is causing insomnia.  Keep a sleep diary to help you and your health care provider figure out what could be causing your insomnia. This information is not intended to replace advice given to you by your health care provider. Make sure you discuss any questions you have with your health care provider. Document Released: 03/05/2000 Document Revised: 12/16/2016 Document Reviewed: 12/16/2016 Elsevier Interactive Patient Education  2019 [employer].

## 2018-04-21 NOTE — Progress Notes (Signed)
Established Patient Office Visit  Subjective:  Patient ID: Kelly Chase, female    DOB: 1946/02/15  Age: 73 y.o. MRN: [id number]  CC:  Chief Complaint  Patient presents with  . sinus drainage    HPI Kelly Chase presents for evaluation of sinus drainage that has been ongoing for about a week. She has been using Flonase daily and it has helped with the congestion in her head. She is still experincing drainage into her chest. She denies fevers, ear pain, chest pain, or coughing.  Patient has ongoing issues with sleep.  Has trouble falling asleep and staying asleep.  Denies depression.  She is trying melatonin this seemed to help some.  Frankey Poot still intubated.  Regular and may not be as severe cramping generally is in the patient comes issues  Past Medical History:  Diagnosis Date  . Acid reflux   . Cancer (HCC)    cervical cancer  . Hypertension   . 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 ABDOMINAL HYSTERECTOMY    . BREAST SURGERY     cyst removed  . CATARACT EXTRACTION W/ INTRAOCULAR LENS IMPLANT Left   . EYE SURGERY    . KNEE ARTHROSCOPY     left knee  . LUMBAR LAMINECTOMY/DECOMPRESSION MICRODISCECTOMY Right 12/29/2017   Procedure: Right Lumbar Four-Five Laminectomy for facet/synovial cyst;  Surgeon: Ashok Pall, MD;  Location: [facility];  Service: Neurosurgery;  Laterality: Right;  Right Lumbar Four-Five Laminectomy for facet/synovial cyst    History reviewed. No pertinent family history.  Social History   Socioeconomic History  . Marital status: Divorced    Spouse name: Not on file  . Number of children: Not on file  . Years of education: Not on file  . Highest education level: Not on file  Occupational History  . Not on file  Social Needs  . Financial resource strain: Not on file  . Food insecurity:    Worry: Not on file    Inability: Not on file  . Transportation needs:   Medical: Not on file    Non-medical: Not on file  Tobacco Use  . Smoking status: Never Smoker  . Smokeless tobacco: Never Used  . Tobacco comment: NEVER USED TOBACCO  Substance and Sexual Activity  . Alcohol use: No    Alcohol/week: 0.0 standard drinks  . Drug use: No  . Sexual activity: Not on file  Lifestyle  . Physical activity:    Days per week: Not on file    Minutes per session: Not on file  . Stress: Not on file  Relationships  . Social connections:    Talks on phone: Not on file    Gets together: Not on file    Attends religious service: Not on file    Active member of club or organization: Not on file    Attends meetings of clubs or organizations: Not on file    Relationship status: Not on file  . Intimate partner violence:    Fear of current or ex partner: Not on file    Emotionally abused: Not on file    Physically abused: Not on file    Forced sexual activity: Not on file  Other Topics Concern  . Not on file  Social History Narrative  . Not on file    Outpatient Medications Prior to Visit  Medication Sig Dispense Refill  . alum & mag hydroxide-simeth (MAALOX/MYLANTA) 200-200-20 MG/5ML  suspension Take 20 mLs by mouth every 6 (six) hours as needed for indigestion or heartburn.     Marland Kitchen amLODipine (NORVASC) 10 MG tablet Take 1 tablet (10 mg total) by mouth daily. 90 tablet 2  . Polyethyl Glycol-Propyl Glycol (SYSTANE) 0.4-0.3 % SOLN Place 2 drops into both eyes daily as needed (for dry eyes).    Marland Kitchen HYDROcodone-acetaminophen (NORCO/VICODIN) 5-325 MG tablet Take 1 tablet by mouth every 6 (six) hours as needed for moderate pain. 30 tablet 0  . lisinopril (PRINIVIL,ZESTRIL) 20 MG tablet Take 1 tablet (20 mg total) by mouth daily. 90 tablet 3  . naproxen sodium (ALEVE) 220 MG tablet Take 220 mg by mouth.    Marland Kitchen tiZANidine (ZANAFLEX) 4 MG tablet Take 1 tablet (4 mg total) by mouth every 6 (six) hours as needed for muscle spasms. 60 tablet 0   No facility-administered  medications prior to visit.     Allergies  Allergen Reactions  . Celecoxib Other (See Comments)    GI upset  . Esomeprazole Other (See Comments)    Joint pain  . Feldene [Piroxicam] Other (See Comments)    abd "burning"  . Chlorthalidone Palpitations    ROS Review of Systems  Constitutional: Negative for diaphoresis, fatigue, fever and unexpected weight change.  HENT: Positive for postnasal drip. Negative for congestion, dental problem, rhinorrhea, sinus pressure and sinus pain.   Eyes: Negative.   Respiratory: Negative.   Cardiovascular: Negative.   Gastrointestinal: Negative.   Musculoskeletal: Negative for arthralgias and myalgias.  Neurological: Negative for weakness and headaches.  Hematological: Does not bruise/bleed easily.  Psychiatric/Behavioral: Positive for dysphoric mood and sleep disturbance. Negative for self-injury and suicidal ideas.     Depression screen [facility] 2/9 04/21/2018 06/09/2017  Decreased Interest 1 0  Down, Depressed, Hopeless 0 0  PHQ - 2 Score 1 0  Altered sleeping 3 -  Tired, decreased energy 0 -  Change in appetite 0 -  Feeling bad or failure about yourself  1 -  Trouble concentrating 0 -  Moving slowly or fidgety/restless 0 -  Suicidal thoughts 0 -  PHQ-9 Score 5 -  Difficult doing work/chores Somewhat difficult -      Objective:    Physical Exam  Constitutional: She is oriented to person, place, and time. She appears well-developed and well-nourished. No distress.  HENT:  Head: Normocephalic and atraumatic.  Right Ear: External ear normal.  Left Ear: External ear normal.  Mouth/Throat: Oropharynx is clear and moist. No oropharyngeal exudate.  Eyes: Pupils are equal, round, and reactive to light. Conjunctivae are normal. Right eye exhibits no discharge. Left eye exhibits no discharge. No scleral icterus.  Neck: Neck supple. No JVD present. No tracheal deviation present. No thyromegaly present.  Cardiovascular: Normal rate, regular  rhythm and normal heart sounds.  Pulmonary/Chest: Effort normal and breath sounds normal. No stridor.  Abdominal: Bowel sounds are normal.  Lymphadenopathy:    She has no cervical adenopathy.  Neurological: She is alert and oriented to person, place, and time.  Skin: Skin is warm and dry. She is not diaphoretic.  Psychiatric: She has a normal mood and affect. Her behavior is normal.    BP 122/80   Pulse 86   Temp (!) 97.4 F (36.3 C) (Oral)   Ht '5\' 6"'$  (1.676 m)   Wt 182 lb 2 oz (82.6 kg)   SpO2 100%   BMI 29.40 kg/m  Wt Readings from Last 3 Encounters:  04/21/18 182 lb 2 oz (82.6 kg)  12/29/17 184 lb (83.5 kg)  12/27/17 184 lb 6.4 oz (83.6 kg)   BP Readings from Last 3 Encounters:  04/21/18 122/80  12/30/17 137/65  12/27/17 (!) 148/70   Guideline developer:  UpToDate (see UpToDate for funding source) Date Released: June 2014  Health Maintenance Due  Topic Date Due  . Samul Dada  06/04/1963  . DEXA SCAN  06/03/2009  . MAMMOGRAM  10/19/2017  . COLONOSCOPY  02/12/2018    There are no preventive care reminders to display for this patient.  Lab Results  Component Value Date   TSH 2.451 06/23/2014   Lab Results  Component Value Date   WBC 5.6 12/27/2017   HGB 11.6 (L) 12/27/2017   HCT 37.6 12/27/2017   MCV 88.9 12/27/2017   PLT 352 12/27/2017   Lab Results  Component Value Date   NA 139 12/27/2017   K 3.7 12/27/2017   CHLORIDE 105 10/10/2015   CO2 25 12/27/2017   GLUCOSE 115 (H) 12/27/2017   BUN 15 12/27/2017   CREATININE 0.95 12/27/2017   BILITOT 0.6 06/09/2017   ALKPHOS 89 06/09/2017   AST 17 06/09/2017   ALT 17 06/09/2017   PROT 8.9 (H) 06/09/2017   ALBUMIN 4.3 06/09/2017   CALCIUM 9.3 12/27/2017   ANIONGAP 6 12/27/2017   EGFR 64 (L) 10/10/2015   GFR 65.35 06/09/2017   Lab Results  Component Value Date   CHOL 200 06/09/2017   Lab Results  Component Value Date   HDL 49.80 06/09/2017   Lab Results  Component Value Date   LDLCALC 127 (H)  06/09/2017   Lab Results  Component Value Date   TRIG 114.0 06/09/2017   Lab Results  Component Value Date   CHOLHDL 4 06/09/2017   No results found for: HGBA1C    Assessment & Plan:   Problem List Items Addressed This Visit      Cardiovascular and Mediastinum   HTN (hypertension)   Relevant Medications   lisinopril (PRINIVIL,ZESTRIL) 20 MG tablet     Other   Primary insomnia - Primary   Relevant Medications   PARoxetine (PAXIL) 10 MG tablet   PND (post-nasal drip)   Relevant Medications   predniSONE (DELTASONE) 10 MG tablet   sodium chloride (OCEAN) 0.65 % SOLN nasal spray      Meds ordered this encounter  Medications  . predniSONE (DELTASONE) 10 MG tablet    Sig: Take 1 tablet (10 mg total) by mouth daily with breakfast for 5 days.    Dispense:  5 tablet    Refill:  0  . sodium chloride (OCEAN) 0.65 % SOLN nasal spray    Sig: Place 1 spray into both nostrils as needed for congestion.    Dispense:  1 Bottle    Refill:  6  . PARoxetine (PAXIL) 10 MG tablet    Sig: Take 1 tablet (10 mg total) by mouth daily.    Dispense:  30 tablet    Refill:  1  . lisinopril (PRINIVIL,ZESTRIL) 20 MG tablet    Sig: Take 1 tablet (20 mg total) by mouth daily.    Dispense:  90 tablet    Refill:  3    Follow-up: Return in about 1 month (around 05/20/2018), or if symptoms worsen or fail to improve, for continue flonase.

## 2018-04-21 NOTE — Telephone Encounter (Signed)
Noted  

## 2018-06-07 IMAGING — DX DG LUMBAR SPINE COMPLETE 4+V
5 series · 5 of 5 positions shown · non-contrast
Comparison: 06/03/2014

CLINICAL DATA: Right leg pain

EXAM:
LUMBAR SPINE - COMPLETE 4+ VIEW

[lumbar spine ap]
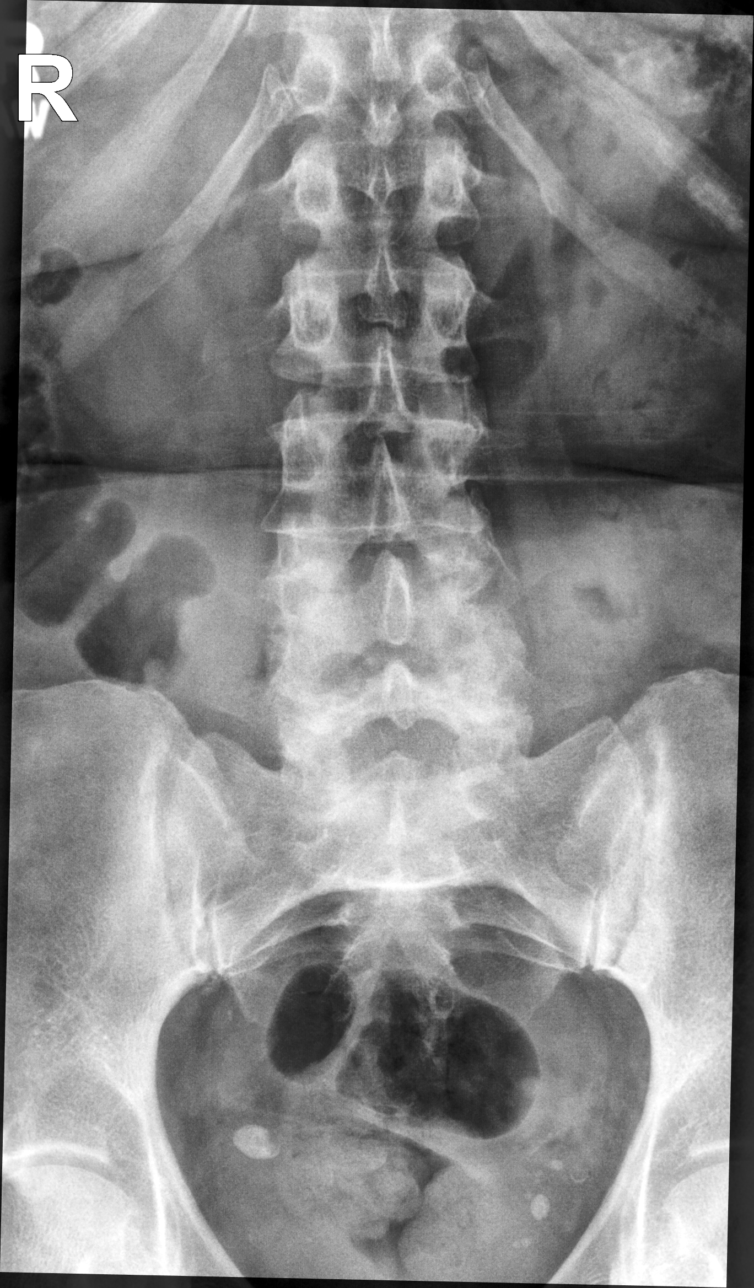

[lumbar spine lmo (1 of 2)]
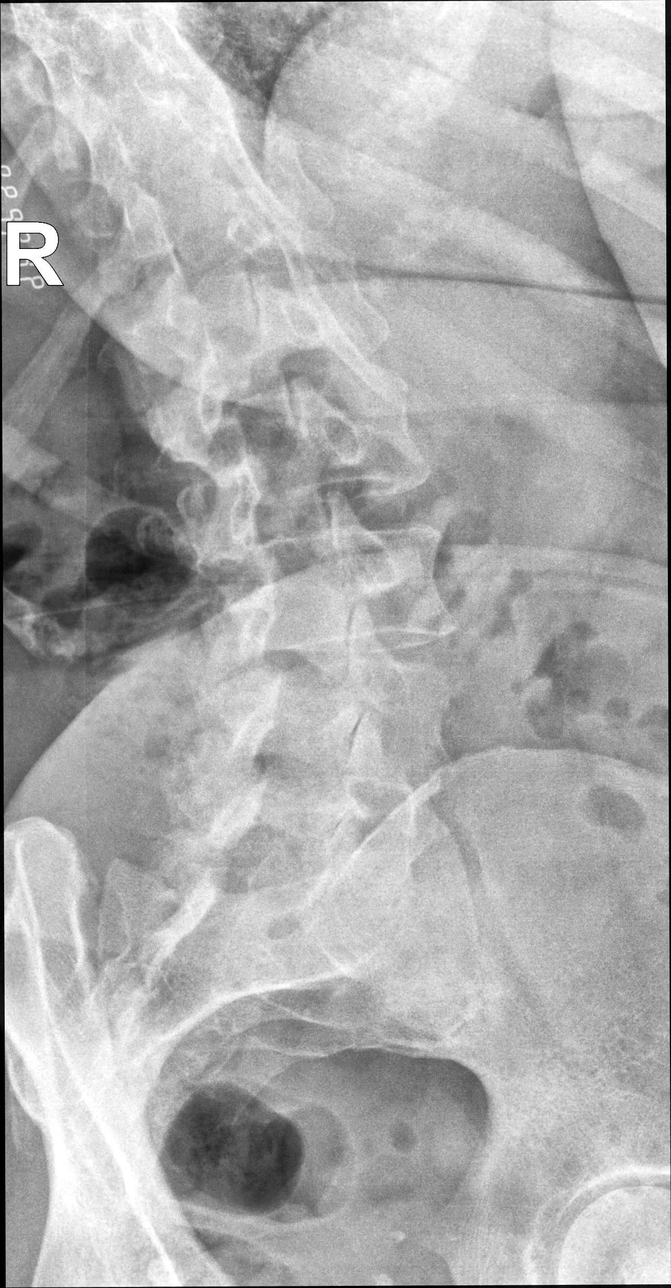

[lumbar spine lmo (2 of 2)]
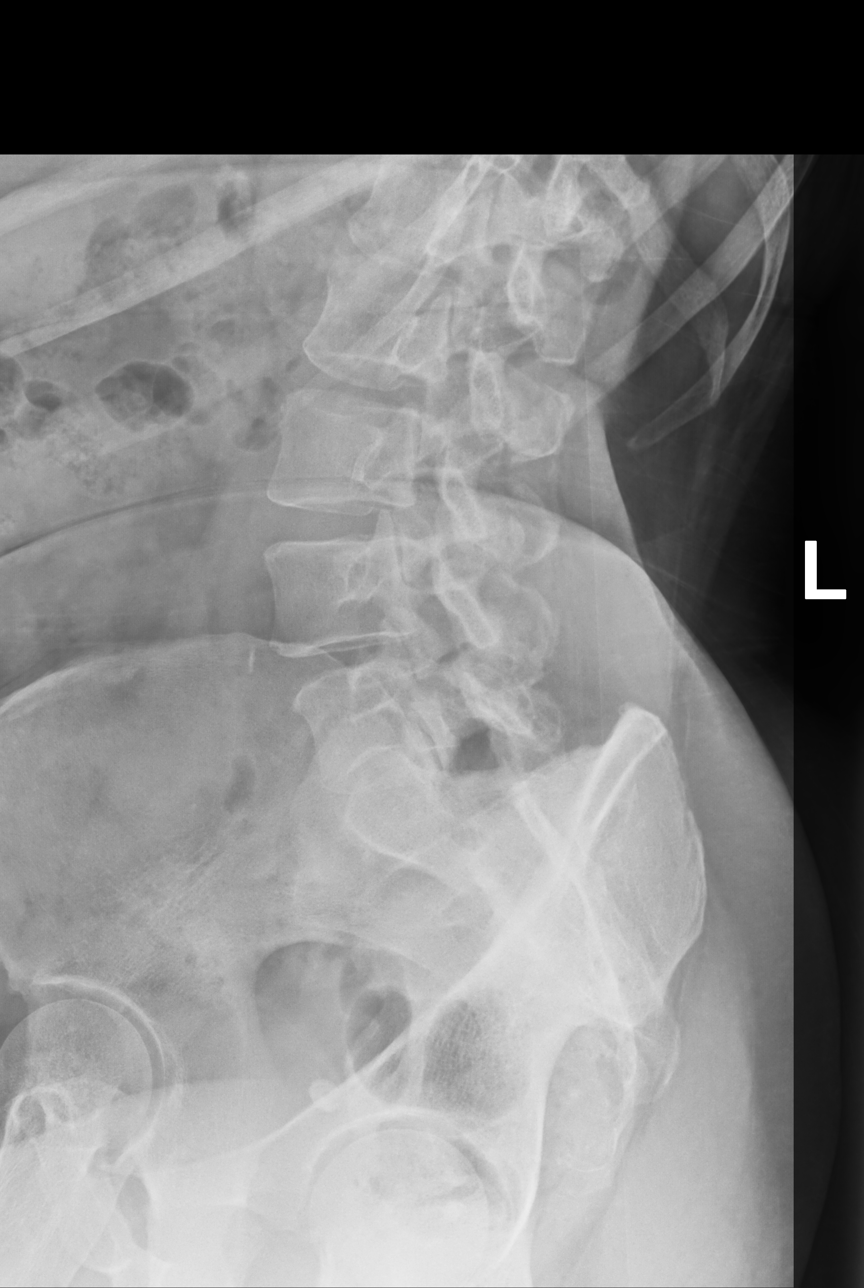

[lumbar spine lat (1 of 2)]
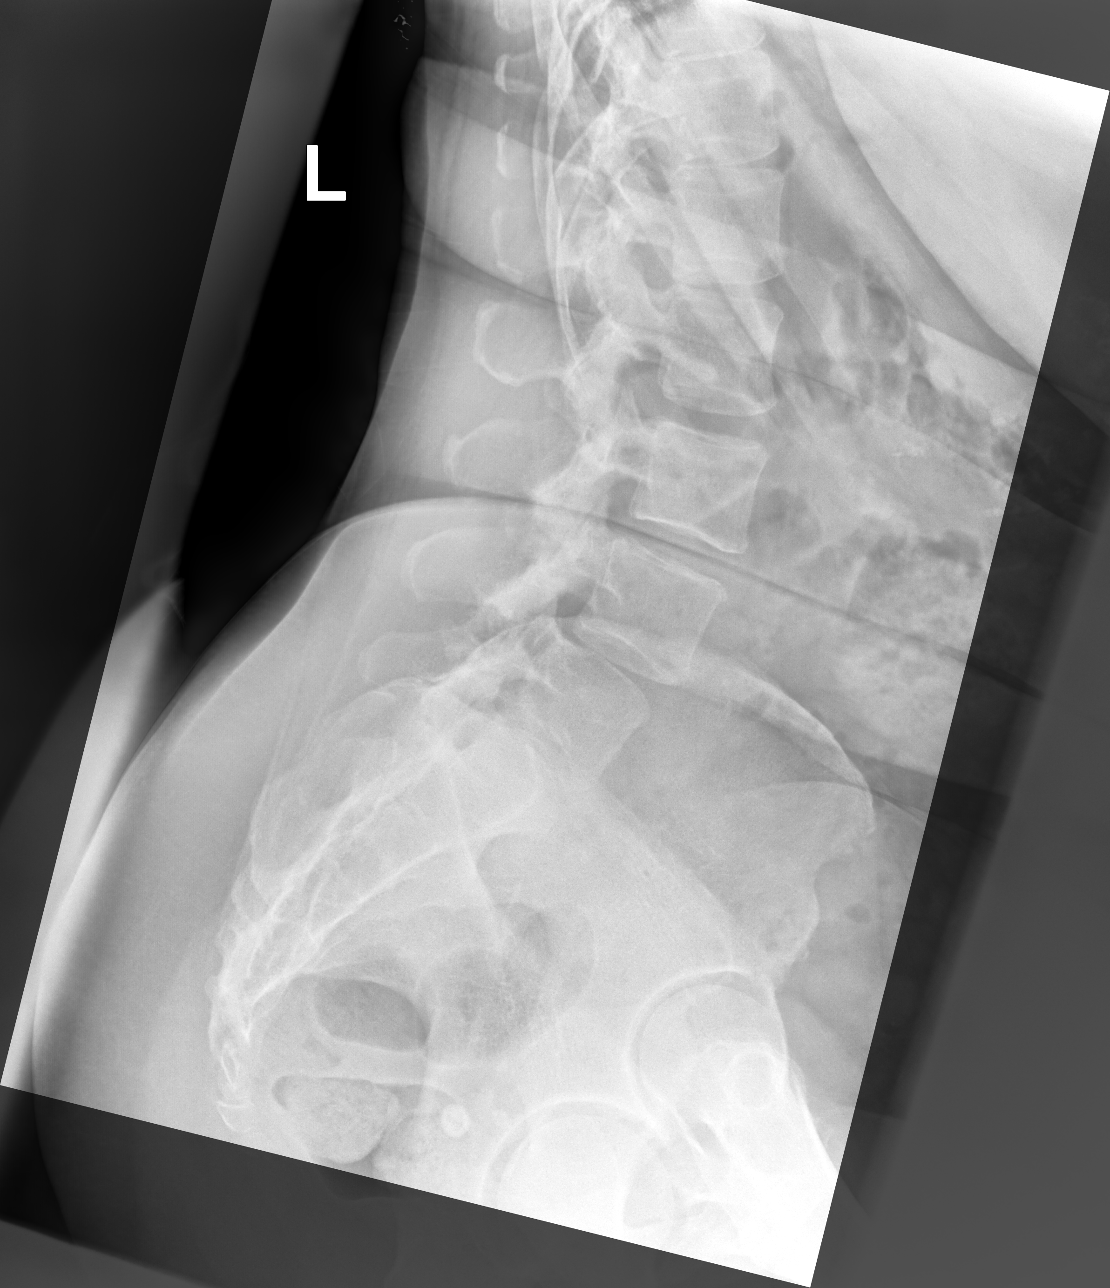

[lumbar spine lat (2 of 2)]
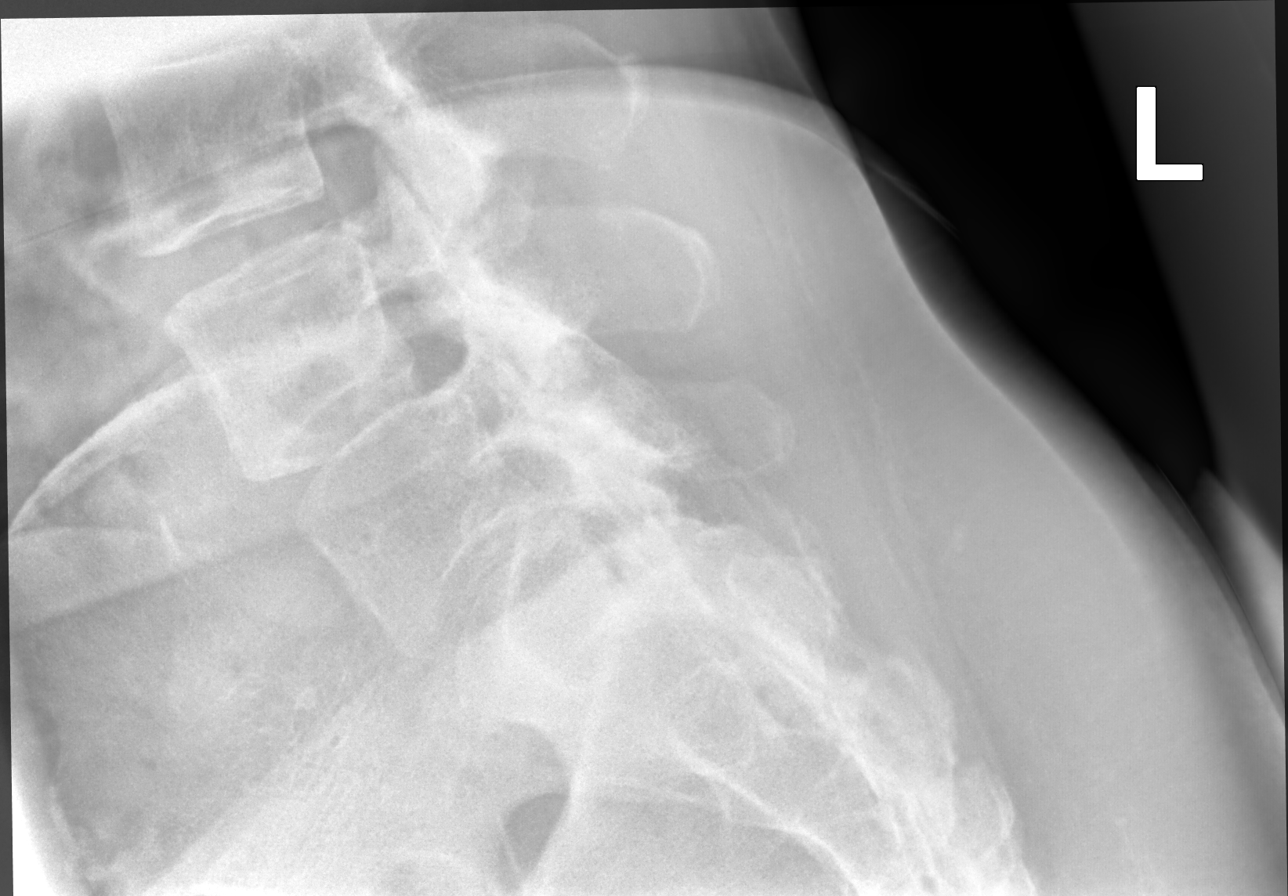

[5 of 5 positions shown; findings below may reference images not displayed]

FINDINGS: Degenerative facet disease throughout the lumbar spine. Probable
early disc space narrowing at L4-5 and L5-S1. Otherwise, disc spaces
are maintained. Normal alignment. No fracture. SI joints are
symmetric and unremarkable.
IMPRESSION: Mild degenerative facet disease. Probable early degenerative disc
disease in the lower lumbar spine. No acute findings.

## 2018-06-14 ENCOUNTER — Ambulatory Visit: Payer: Medicare Other | Admitting: Family Medicine

## 2018-07-19 ENCOUNTER — Telehealth: Payer: Self-pay | Admitting: Family Medicine

## 2018-07-19 NOTE — Telephone Encounter (Signed)
Called and left vm for patient. Calling to schedule virtual visit with Dr. Ethelene Hal.

## 2018-09-11 ENCOUNTER — Other Ambulatory Visit: Payer: Self-pay | Admitting: Family Medicine

## 2018-09-11 DIAGNOSIS — I10 Essential (primary) hypertension: Secondary | ICD-10-CM

## 2018-10-13 ENCOUNTER — Ambulatory Visit: Payer: Medicare Other | Admitting: Family Medicine

## 2018-10-13 DIAGNOSIS — I10 Essential (primary) hypertension: Secondary | ICD-10-CM | POA: Diagnosis not present

## 2018-10-13 DIAGNOSIS — E7800 Pure hypercholesterolemia, unspecified: Secondary | ICD-10-CM | POA: Diagnosis not present

## 2018-10-13 DIAGNOSIS — D649 Anemia, unspecified: Secondary | ICD-10-CM | POA: Diagnosis not present

## 2018-10-13 DIAGNOSIS — D472 Monoclonal gammopathy: Secondary | ICD-10-CM | POA: Diagnosis not present

## 2018-10-13 DIAGNOSIS — Z1239 Encounter for other screening for malignant neoplasm of breast: Secondary | ICD-10-CM | POA: Diagnosis not present

## 2018-10-13 DIAGNOSIS — Z1231 Encounter for screening mammogram for malignant neoplasm of breast: Secondary | ICD-10-CM | POA: Diagnosis not present

## 2018-10-17 ENCOUNTER — Other Ambulatory Visit (HOSPITAL_BASED_OUTPATIENT_CLINIC_OR_DEPARTMENT_OTHER): Payer: Self-pay | Admitting: Internal Medicine

## 2018-10-17 DIAGNOSIS — Z1231 Encounter for screening mammogram for malignant neoplasm of breast: Secondary | ICD-10-CM

## 2018-10-24 ENCOUNTER — Ambulatory Visit (HOSPITAL_BASED_OUTPATIENT_CLINIC_OR_DEPARTMENT_OTHER): Payer: Medicare Other

## 2018-10-30 ENCOUNTER — Encounter (HOSPITAL_BASED_OUTPATIENT_CLINIC_OR_DEPARTMENT_OTHER): Payer: Self-pay

## 2018-10-30 ENCOUNTER — Ambulatory Visit (HOSPITAL_BASED_OUTPATIENT_CLINIC_OR_DEPARTMENT_OTHER)
Admission: RE | Admit: 2018-10-30 | Discharge: 2018-10-30 | Disposition: A | Discharge status: Home / Self Care | Payer: Medicare Other | Source: Ambulatory Visit | Attending: Internal Medicine | Admitting: Internal Medicine

## 2018-10-30 ENCOUNTER — Other Ambulatory Visit: Payer: Self-pay

## 2018-10-30 DIAGNOSIS — Z1231 Encounter for screening mammogram for malignant neoplasm of breast: Secondary | ICD-10-CM | POA: Insufficient documentation

## 2018-10-31 ENCOUNTER — Other Ambulatory Visit: Payer: Self-pay | Admitting: Internal Medicine

## 2018-10-31 DIAGNOSIS — R928 Other abnormal and inconclusive findings on diagnostic imaging of breast: Secondary | ICD-10-CM

## 2018-11-10 ENCOUNTER — Ambulatory Visit: Payer: Medicare Other

## 2018-11-10 ENCOUNTER — Ambulatory Visit
Admission: RE | Admit: 2018-11-10 | Discharge: 2018-11-10 | Disposition: A | Discharge status: Home / Self Care | Payer: Medicare Other | Source: Ambulatory Visit | Attending: Internal Medicine | Admitting: Internal Medicine

## 2018-11-10 ENCOUNTER — Other Ambulatory Visit: Payer: Self-pay

## 2018-11-10 ENCOUNTER — Other Ambulatory Visit: Payer: Self-pay | Admitting: Internal Medicine

## 2018-11-10 DIAGNOSIS — R928 Other abnormal and inconclusive findings on diagnostic imaging of breast: Secondary | ICD-10-CM | POA: Diagnosis not present

## 2018-11-10 DIAGNOSIS — R921 Mammographic calcification found on diagnostic imaging of breast: Secondary | ICD-10-CM

## 2019-04-16 DIAGNOSIS — Z0000 Encounter for general adult medical examination without abnormal findings: Secondary | ICD-10-CM | POA: Diagnosis not present

## 2019-04-16 DIAGNOSIS — Z1211 Encounter for screening for malignant neoplasm of colon: Secondary | ICD-10-CM | POA: Diagnosis not present

## 2019-04-16 DIAGNOSIS — K219 Gastro-esophageal reflux disease without esophagitis: Secondary | ICD-10-CM | POA: Diagnosis not present

## 2019-04-16 DIAGNOSIS — I10 Essential (primary) hypertension: Secondary | ICD-10-CM | POA: Diagnosis not present

## 2019-04-16 DIAGNOSIS — D472 Monoclonal gammopathy: Secondary | ICD-10-CM | POA: Diagnosis not present

## 2019-04-17 DIAGNOSIS — Z1211 Encounter for screening for malignant neoplasm of colon: Secondary | ICD-10-CM | POA: Diagnosis not present

## 2019-04-17 DIAGNOSIS — R195 Other fecal abnormalities: Secondary | ICD-10-CM | POA: Diagnosis not present

## 2019-05-04 DIAGNOSIS — R195 Other fecal abnormalities: Secondary | ICD-10-CM | POA: Diagnosis not present

## 2019-05-04 DIAGNOSIS — Z1211 Encounter for screening for malignant neoplasm of colon: Secondary | ICD-10-CM | POA: Diagnosis not present

## 2019-05-07 DIAGNOSIS — K5730 Diverticulosis of large intestine without perforation or abscess without bleeding: Secondary | ICD-10-CM | POA: Diagnosis not present

## 2019-05-07 DIAGNOSIS — K648 Other hemorrhoids: Secondary | ICD-10-CM | POA: Diagnosis not present

## 2019-05-07 DIAGNOSIS — D123 Benign neoplasm of transverse colon: Secondary | ICD-10-CM | POA: Diagnosis not present

## 2019-05-07 DIAGNOSIS — D122 Benign neoplasm of ascending colon: Secondary | ICD-10-CM | POA: Diagnosis not present

## 2019-05-07 DIAGNOSIS — R195 Other fecal abnormalities: Secondary | ICD-10-CM | POA: Diagnosis not present

## 2019-05-07 DIAGNOSIS — D126 Benign neoplasm of colon, unspecified: Secondary | ICD-10-CM | POA: Diagnosis not present

## 2019-07-12 ENCOUNTER — Other Ambulatory Visit: Payer: Self-pay | Admitting: Family Medicine

## 2019-07-12 DIAGNOSIS — I10 Essential (primary) hypertension: Secondary | ICD-10-CM

## 2019-07-20 DIAGNOSIS — R195 Other fecal abnormalities: Secondary | ICD-10-CM | POA: Diagnosis not present

## 2019-07-25 DIAGNOSIS — D649 Anemia, unspecified: Secondary | ICD-10-CM | POA: Diagnosis not present

## 2019-07-26 NOTE — Telephone Encounter (Signed)
Can ya'll plz sched a F/U per WK/thx dmf

## 2019-07-27 ENCOUNTER — Telehealth: Payer: Self-pay | Admitting: Family Medicine

## 2019-07-27 NOTE — Telephone Encounter (Addendum)
LVM to call back to schedule follow up appointment

## 2019-10-15 DIAGNOSIS — E7800 Pure hypercholesterolemia, unspecified: Secondary | ICD-10-CM | POA: Diagnosis not present

## 2019-10-15 DIAGNOSIS — F5101 Primary insomnia: Secondary | ICD-10-CM | POA: Diagnosis not present

## 2019-10-15 DIAGNOSIS — D649 Anemia, unspecified: Secondary | ICD-10-CM | POA: Diagnosis not present

## 2019-10-15 DIAGNOSIS — I10 Essential (primary) hypertension: Secondary | ICD-10-CM | POA: Diagnosis not present

## 2020-01-30 DIAGNOSIS — Z23 Encounter for immunization: Secondary | ICD-10-CM | POA: Diagnosis not present

## 2020-03-07 DIAGNOSIS — F411 Generalized anxiety disorder: Secondary | ICD-10-CM | POA: Diagnosis not present

## 2020-03-07 DIAGNOSIS — D649 Anemia, unspecified: Secondary | ICD-10-CM | POA: Diagnosis not present

## 2021-03-09 ENCOUNTER — Inpatient Hospital Stay: Payer: Medicare Other | Attending: Hematology & Oncology

## 2021-03-09 ENCOUNTER — Inpatient Hospital Stay (HOSPITAL_BASED_OUTPATIENT_CLINIC_OR_DEPARTMENT_OTHER): Payer: Medicare Other | Admitting: Hematology & Oncology

## 2021-03-09 ENCOUNTER — Other Ambulatory Visit: Payer: Self-pay

## 2021-03-09 ENCOUNTER — Encounter: Payer: Self-pay | Admitting: Hematology & Oncology

## 2021-03-09 VITALS — BP 136/73 | HR 74 | Temp 98.1°F | Resp 18 | Ht 65.0 in | Wt 183.0 lb

## 2021-03-09 DIAGNOSIS — D472 Monoclonal gammopathy: Secondary | ICD-10-CM

## 2021-03-09 DIAGNOSIS — Z79899 Other long term (current) drug therapy: Secondary | ICD-10-CM | POA: Insufficient documentation

## 2021-03-09 DIAGNOSIS — D649 Anemia, unspecified: Secondary | ICD-10-CM | POA: Insufficient documentation

## 2021-03-09 LAB — CMP (CANCER CENTER ONLY)
ALT: 14 U/L (ref 0–44)
AST: 15 U/L (ref 15–41)
Albumin: 4.2 g/dL (ref 3.5–5.0)
Alkaline Phosphatase: 80 U/L (ref 38–126)
Anion gap: 8 (ref 5–15)
BUN: 13 mg/dL (ref 8–23)
CO2: 26 mmol/L (ref 22–32)
Calcium: 9.9 mg/dL (ref 8.9–10.3)
Chloride: 106 mmol/L (ref 98–111)
Creatinine: 0.95 mg/dL (ref 0.44–1.00)
GFR, Estimated: >60 mL/min (ref >60)
Glucose, Bld: 85 mg/dL (ref 70–99)
Potassium: 3.6 mmol/L (ref 3.5–5.1)
Sodium: 140 mmol/L (ref 135–145)
Total Bilirubin: 0.7 mg/dL (ref 0.3–1.2)
Total Protein: 8.5 g/dL — ABNORMAL HIGH (ref 6.5–8.1)

## 2021-03-09 LAB — CBC WITH DIFFERENTIAL (CANCER CENTER ONLY)
Abs Immature Granulocytes: 0.01 10*3/uL (ref 0.00–0.07)
Basophils Absolute: 0.1 10*3/uL (ref 0.0–0.1)
Basophils Relative: 1 %
Eosinophils Absolute: 0.3 10*3/uL (ref 0.0–0.5)
Eosinophils Relative: 6 %
HCT: 33 % — ABNORMAL LOW (ref 36.0–46.0)
Hemoglobin: 11 g/dL — ABNORMAL LOW (ref 12.0–15.0)
Immature Granulocytes: 0 %
Lymphocytes Relative: 43 %
Lymphs Abs: 2.2 10*3/uL (ref 0.7–4.0)
MCH: 28.6 pg (ref 26.0–34.0)
MCHC: 33.3 g/dL (ref 30.0–36.0)
MCV: 85.7 fL (ref 80.0–100.0)
Monocytes Absolute: 0.4 10*3/uL (ref 0.1–1.0)
Monocytes Relative: 7 %
Neutro Abs: 2.3 10*3/uL (ref 1.7–7.7)
Neutrophils Relative %: 43 %
Platelet Count: 340 10*3/uL (ref 150–400)
RBC: 3.85 MIL/uL — ABNORMAL LOW (ref 3.87–5.11)
RDW: 14.7 % (ref 11.5–15.5)
WBC Count: 5.3 10*3/uL (ref 4.0–10.5)
nRBC: 0 % (ref 0.0–0.2)

## 2021-03-09 LAB — LACTATE DEHYDROGENASE: LDH: 226 U/L — ABNORMAL HIGH (ref 98–192)

## 2021-03-09 NOTE — Progress Notes (Signed)
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  HPI: Ms. Collantes is well-known to me.  She is a very charming 75 year old African-American female.  I last saw her back in July 2017.  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  At the time, we really cannot find any measurable protein in her blood.  I feel that we can probably let her go from the clinic and just have her come back as needed.  She apparently changed doctors.  Her new doctor is very thorough.  She noted that there was a past history of MGUS.  As such, she ran a SPEP on Ms. Protzman.  This showed a monoclonal spike of 1.88 g/dL.  Ms. Mazurek feels well.  She really has had no problems since we last saw her.  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  She seems to be doing well.  She has had no problems with infections.  She has had no swollen lymph nodes.  She has had no change in bowel or bladder habits.  She had a last mammogram back in August 2020.  Has been no bleeding.  She has had no problems with leg swelling.  She has had no issues with headache.  There is been no problems with COVID.  Currently, I would say performance status is probably ECOG 0.  Past Medical History:  Diagnosis Date   Acid reflux    Cancer ([place])    cervical cancer   Hypertension    MGUS (monoclonal gammopathy of unknown significance) 09/24/2014  :   Past Surgical History:  Procedure Laterality Date   ABDOMINAL HYSTERECTOMY     BREAST CYST EXCISION Right    BREAST SURGERY     cyst removed   CATARACT EXTRACTION W/ INTRAOCULAR LENS IMPLANT Left    EYE SURGERY     KNEE ARTHROSCOPY     left knee   LUMBAR LAMINECTOMY/DECOMPRESSION MICRODISCECTOMY Right 12/29/2017   Procedure: Right Lumbar Four-Five Laminectomy for facet/synovial cyst;  Surgeon:  Ashok Pall, MD;  Location: [facility];  Service: Neurosurgery;  Laterality: Right;  Right Lumbar Four-Five Laminectomy for facet/synovial cyst  :   Current Outpatient Medications:    alum & mag hydroxide-simeth (MAALOX/MYLANTA) 200-200-20 MG/5ML suspension, Take 20 mLs by mouth every 6 (six) hours as needed for indigestion or heartburn. , Disp: , Rfl:    amLODipine (NORVASC) 10 MG tablet, TAKE 1 TABLET(10 MG) BY MOUTH DAILY, Disp: 90 tablet, Rfl: 0   lisinopril (PRINIVIL,ZESTRIL) 20 MG tablet, Take 1 tablet (20 mg total) by mouth daily., Disp: 90 tablet, Rfl: 3   PARoxetine (PAXIL) 10 MG tablet, Take 1 tablet (10 mg total) by mouth daily., Disp: 30 tablet, Rfl: 1   Polyethyl Glycol-Propyl Glycol (SYSTANE) 0.4-0.3 % SOLN, Place 2 drops into both eyes daily as needed (for dry eyes)., Disp: , Rfl:    sodium chloride (OCEAN) 0.65 % SOLN nasal spray, Place 1 spray into both nostrils as needed for congestion., Disp: 1 Bottle, Rfl: 6:  :   Allergies  Allergen Reactions   Celecoxib Other (See Comments)    GI upset   Esomeprazole Other (See Comments)    Joint pain   Feldene [Piroxicam] Other (See Comments)  abd "burning"   Chlorthalidone Palpitations  :  History reviewed. No pertinent family history.:   Social History   Socioeconomic History   Marital status: Divorced    Spouse name: Not on file   Number of children: Not on file   Years of education: Not on file   Highest education level: Not on file  Occupational History   Not on file  Tobacco Use   Smoking status: Never   Smokeless tobacco: Never   Tobacco comments:    NEVER USED TOBACCO  Substance and Sexual Activity   Alcohol use: No    Alcohol/week: 0.0 standard drinks   Drug use: No   Sexual activity: Not on file  Other Topics Concern   Not on file  Social History Narrative   Not on file   Social Determinants of Health   Financial Resource Strain: Not on file  Food Insecurity: Not on file  Transportation Needs:  Not on file  Physical Activity: Not on file  Stress: Not on file  Social Connections: Not on file  Intimate Partner Violence: Not on file  :  Review of Systems  Constitutional: Negative.   HENT: Negative.    Eyes: Negative.   Respiratory: Negative.    Cardiovascular: Negative.   Gastrointestinal: Negative.   Genitourinary: Negative.   Musculoskeletal: Negative.   Skin: Negative.   Neurological: Negative.   Endo/Heme/Allergies: Negative.   Psychiatric/Behavioral: Negative.      Exam: _0 @ Physical Exam Vitals reviewed.  HENT:     Head: Normocephalic and atraumatic.  Eyes:     Pupils: Pupils are equal, round, and reactive to light.  Cardiovascular:     Rate and Rhythm: Normal rate and regular rhythm.     Heart sounds: Normal heart sounds.  Pulmonary:     Effort: Pulmonary effort is normal.     Breath sounds: Normal breath sounds.  Abdominal:     General: Bowel sounds are normal.     Palpations: Abdomen is soft.  Musculoskeletal:        General: No tenderness or deformity. Normal range of motion.     Cervical back: Normal range of motion.  Lymphadenopathy:     Cervical: No cervical adenopathy.  Skin:    General: Skin is warm and dry.     Findings: No erythema or rash.  Neurological:     Mental Status: She is alert and oriented to person, place, and time.  Psychiatric:        Behavior: Behavior normal.        Thought Content: Thought content normal.        Judgment: Judgment normal.    Recent Labs    03/09/21 1407  WBC 5.3  HGB 11.0*  HCT 33.0*  PLT 340    Recent Labs    03/09/21 1407  NA 140  K 3.6  CL 106  CO2 26  GLUCOSE 85  BUN 13  CREATININE 0.95  CALCIUM 9.9    Blood smear review: There is a normochromic and normocytic population of red blood cells.  I see no rouleaux formation.  She has no nucleated red blood cells.  I see no teardrop cells.  She has no schistocytes or spherocytes.  White blood cells.  Normal in morphology and  maturation.  There is no immature myeloid or lymphoid forms.  She has no blasts.  Platelets are adequate in number and size.  Pathology: None     Assessment and Plan: Ms. Lemaster is a very  charming 75 year old African-American female.  She has a history of a IgG kappa MGUS.  She has had this for least 5-6 years.  We will have to see what her monoclonal studies show.  I cannot imagine that this is going to be a problem for her..  She is a little bit anemic.  I think her renal function is doing fine.  For right now, I do not see that we will have to do any kind of invasive studies on her, such as a bone marrow biopsy.  I would like to hold off on doing x-rays on her.  She is asymptomatic with any kind of bony pain.  It was nice to see her again.  We had good fellowship.  I did give her a prayer blanket.  She was very grateful for this.  Provide think we can probably get her back sometime in the Spring.  I just cannot imagine that we would find much difference with her monoclonal studies that we had 5 years ago.

## 2021-03-10 ENCOUNTER — Telehealth: Payer: Self-pay | Admitting: Hematology & Oncology

## 2021-03-10 LAB — KAPPA/LAMBDA LIGHT CHAINS
Kappa free light chain: 43.9 mg/L — ABNORMAL HIGH (ref 3.3–19.4)
Kappa, lambda light chain ratio: 3.03 — ABNORMAL HIGH (ref 0.26–1.65)
Lambda free light chains: 14.5 mg/L (ref 5.7–26.3)

## 2021-03-10 LAB — IGG, IGA, IGM
IgA: 168 mg/dL (ref 64–422)
IgG (Immunoglobin G), Serum: 2943 mg/dL — ABNORMAL HIGH (ref 586–1602)
IgM (Immunoglobulin M), Srm: 126 mg/dL (ref 26–217)

## 2021-03-10 LAB — BETA 2 MICROGLOBULIN, SERUM: Beta-2 Microglobulin: 1.9 mg/L (ref 0.6–2.4)

## 2021-03-10 NOTE — Telephone Encounter (Signed)
Called pateint to scheduled appt per los 12/19, she is aware of appt on 5/11.

## 2021-03-11 DIAGNOSIS — D472 Monoclonal gammopathy: Secondary | ICD-10-CM | POA: Diagnosis not present

## 2021-03-13 LAB — IMMUNOFIXATION REFLEX, SERUM
IgA: 169 mg/dL (ref 64–422)
IgG (Immunoglobin G), Serum: 2997 mg/dL — ABNORMAL HIGH (ref 586–1602)
IgM (Immunoglobulin M), Srm: 130 mg/dL (ref 26–217)

## 2021-03-13 LAB — PROTEIN ELECTROPHORESIS, SERUM, WITH REFLEX
A/G Ratio: 0.9 (ref 0.7–1.7)
Albumin ELP: 3.9 g/dL (ref 2.9–4.4)
Alpha-1-Globulin: 0.3 g/dL (ref 0.0–0.4)
Alpha-2-Globulin: 0.5 g/dL (ref 0.4–1.0)
Beta Globulin: 1 g/dL (ref 0.7–1.3)
Gamma Globulin: 2.4 g/dL — ABNORMAL HIGH (ref 0.4–1.8)
Globulin, Total: 4.2 g/dL — ABNORMAL HIGH (ref 2.2–3.9)
M-Spike, %: 1.6 g/dL — ABNORMAL HIGH
SPEP Interpretation: 0
Total Protein ELP: 8.1 g/dL (ref 6.0–8.5)

## 2021-03-13 LAB — UPEP/UIFE/LIGHT CHAINS/TP, 24-HR UR
% BETA, Urine: 0 %
ALPHA 1 URINE: 0 %
Albumin, U: 100 %
Alpha 2, Urine: 0 %
Free Kappa Lt Chains,Ur: 63.08 mg/L (ref 1.17–86.46)
Free Kappa/Lambda Ratio: 23.19 — ABNORMAL HIGH (ref 1.83–14.26)
Free Lambda Lt Chains,Ur: 2.72 mg/L (ref 0.27–15.21)
GAMMA GLOBULIN URINE: 0 %
Total Protein, Urine-Ur/day: 104 mg/24 hr (ref 30–150)
Total Protein, Urine: 8 mg/dL
Total Volume: 1300

## 2021-07-30 ENCOUNTER — Other Ambulatory Visit: Payer: Self-pay

## 2021-07-30 ENCOUNTER — Encounter: Payer: Self-pay | Admitting: Hematology & Oncology

## 2021-07-30 ENCOUNTER — Inpatient Hospital Stay (HOSPITAL_BASED_OUTPATIENT_CLINIC_OR_DEPARTMENT_OTHER): Payer: Medicare Other | Admitting: Hematology & Oncology

## 2021-07-30 ENCOUNTER — Inpatient Hospital Stay: Payer: Medicare Other | Attending: Hematology & Oncology

## 2021-07-30 VITALS — BP 140/78 | HR 73 | Temp 97.8°F | Resp 16 | Wt 180.0 lb

## 2021-07-30 DIAGNOSIS — Z79899 Other long term (current) drug therapy: Secondary | ICD-10-CM | POA: Insufficient documentation

## 2021-07-30 DIAGNOSIS — M25511 Pain in right shoulder: Secondary | ICD-10-CM | POA: Diagnosis not present

## 2021-07-30 DIAGNOSIS — D472 Monoclonal gammopathy: Secondary | ICD-10-CM | POA: Insufficient documentation

## 2021-07-30 LAB — CMP (CANCER CENTER ONLY)
ALT: 13 U/L (ref 0–44)
AST: 14 U/L — ABNORMAL LOW (ref 15–41)
Albumin: 4.2 g/dL (ref 3.5–5.0)
Alkaline Phosphatase: 81 U/L (ref 38–126)
Anion gap: 5 (ref 5–15)
BUN: 12 mg/dL (ref 8–23)
CO2: 28 mmol/L (ref 22–32)
Calcium: 10.2 mg/dL (ref 8.9–10.3)
Chloride: 104 mmol/L (ref 98–111)
Creatinine: 1.01 mg/dL — ABNORMAL HIGH (ref 0.44–1.00)
GFR, Estimated: 57 mL/min — ABNORMAL LOW (ref >60)
Glucose, Bld: 85 mg/dL (ref 70–99)
Potassium: 3.9 mmol/L (ref 3.5–5.1)
Sodium: 137 mmol/L (ref 135–145)
Total Bilirubin: 0.9 mg/dL (ref 0.3–1.2)
Total Protein: 8.6 g/dL — ABNORMAL HIGH (ref 6.5–8.1)

## 2021-07-30 LAB — CBC WITH DIFFERENTIAL (CANCER CENTER ONLY)
Abs Immature Granulocytes: 0.01 10*3/uL (ref 0.00–0.07)
Basophils Absolute: 0.1 10*3/uL (ref 0.0–0.1)
Basophils Relative: 1 %
Eosinophils Absolute: 0.3 10*3/uL (ref 0.0–0.5)
Eosinophils Relative: 5 %
HCT: 34.1 % — ABNORMAL LOW (ref 36.0–46.0)
Hemoglobin: 11 g/dL — ABNORMAL LOW (ref 12.0–15.0)
Immature Granulocytes: 0 %
Lymphocytes Relative: 40 %
Lymphs Abs: 2.3 10*3/uL (ref 0.7–4.0)
MCH: 28.1 pg (ref 26.0–34.0)
MCHC: 32.3 g/dL (ref 30.0–36.0)
MCV: 87.2 fL (ref 80.0–100.0)
Monocytes Absolute: 0.4 10*3/uL (ref 0.1–1.0)
Monocytes Relative: 7 %
Neutro Abs: 2.7 10*3/uL (ref 1.7–7.7)
Neutrophils Relative %: 47 %
Platelet Count: 394 10*3/uL (ref 150–400)
RBC: 3.91 MIL/uL (ref 3.87–5.11)
RDW: 14.9 % (ref 11.5–15.5)
WBC Count: 5.7 10*3/uL (ref 4.0–10.5)
nRBC: 0 % (ref 0.0–0.2)

## 2021-07-30 LAB — LACTATE DEHYDROGENASE: LDH: 233 U/L — ABNORMAL HIGH (ref 98–192)

## 2021-07-30 NOTE — Progress Notes (Signed)
?Hematology and Oncology Follow Up Visit ? ?Kelly Chase ?[id number] ?1945-12-21 76 y.o. ?07/30/2021 ? ? ?Principle Diagnosis:  ?IgG kappa MGUS ? ?Current Therapy:   ?Observation ?    ?Interim History:  Kelly Chase is back for follow-up.  We saw her back in December.  Before that, it been 5 years. ? ?She has a very stable IgG kappa MGUS.  We will recheck her monoclonal studies in December, her M spike was 1.6 g/dL.  5 years ago was 1.5 g/dL.  The IgG level was 3000 mg/dL.  5 years ago it was 2600 mg/dL.  Her kappa light chain was 4.3 mg/dL. ? ?Unfortunately, the problem that she has right now is that she hurt her right shoulder.  She is picking up some heavy cases of water.  She felt a "pop".  She now can only lift her right arm up to 90 degrees.  As such, I suspect that this is a rotator cuff tear. ? ?We will have to get an MRI of her right shoulder. ? ?Otherwise, she says she is doing okay.  She has not had any problems with COVID since we last saw her.  She has had no nausea or vomiting.  She has had no cough or shortness of breath.  She has had no change in bowel or bladder habits.  She has had no rashes.  There has been no bleeding.  She has had no fever. ? ?Overall, I would say performance status is probably ECOG 1. ? ?Medications:  ?Current Outpatient Medications:  ?  alum & mag hydroxide-simeth (MAALOX/MYLANTA) 200-200-20 MG/5ML suspension, Take 20 mLs by mouth every 6 (six) hours as needed for indigestion or heartburn. , Disp: , Rfl:  ?  amLODipine (NORVASC) 10 MG tablet, TAKE 1 TABLET(10 MG) BY MOUTH DAILY, Disp: 90 tablet, Rfl: 0 ?  bismuth subsalicylate (PEPTO BISMOL) 262 MG/15ML suspension, Take by mouth once as needed., Disp: , Rfl:  ?  Ca Carbonate-Mag Hydroxide 400-135 MG/5ML SUSP, Take by mouth once as needed., Disp: , Rfl:  ?  diphenhydrAMINE (SOMINEX) 25 MG tablet, Take 25 mg by mouth as needed., Disp: , Rfl:  ?  lisinopril (PRINIVIL,ZESTRIL) 20 MG tablet, Take 1 tablet (20 mg total) by mouth  daily., Disp: 90 tablet, Rfl: 3 ?  Polyethyl Glycol-Propyl Glycol (SYSTANE) 0.4-0.3 % SOLN, Place 2 drops into both eyes daily as needed (for dry eyes)., Disp: , Rfl:  ?  sodium chloride (OCEAN) 0.65 % SOLN nasal spray, Place 1 spray into both nostrils as needed for congestion., Disp: 1 Bottle, Rfl: 6 ? ?Allergies:  ?Allergies  ?Allergen Reactions  ? Celecoxib Other (See Comments)  ?  GI upset  ? Esomeprazole Other (See Comments)  ?  Joint pain  ? Feldene [Piroxicam] Other (See Comments)  ?  abd "burning"  ? Chlorthalidone Palpitations  ? ? ?Past Medical History, Surgical history, Social history, and Family History were reviewed and updated. ? ?Review of Systems: ?Review of Systems  ?Constitutional: Negative.   ?HENT:  Negative.    ?Eyes: Negative.   ?Respiratory: Negative.    ?Cardiovascular: Negative.   ?Gastrointestinal: Negative.   ?Endocrine: Negative.   ?Genitourinary: Negative.    ?Musculoskeletal:  Positive for arthralgias and myalgias.  ?Skin: Negative.   ?Neurological: Negative.   ?Hematological: Negative.   ?Psychiatric/Behavioral: Negative.    ? ?Physical Exam: ? weight is 180 lb (81.6 kg). Her oral temperature is 97.8 ?F (36.6 ?C). Her blood pressure is 140/78 and her pulse is 73.  Her respiration is 16 and oxygen saturation is 100%.  ? ?Wt Readings from Last 3 Encounters:  ?07/30/21 180 lb (81.6 kg)  ?03/09/21 183 lb (83 kg)  ?04/21/18 182 lb 2 oz (82.6 kg)  ? ? ?Physical Exam ?Vitals reviewed.  ?HENT:  ?   Head: Normocephalic and atraumatic.  ?Eyes:  ?   Pupils: Pupils are equal, round, and reactive to light.  ?Cardiovascular:  ?   Rate and Rhythm: Normal rate and regular rhythm.  ?   Heart sounds: Normal heart sounds.  ?Pulmonary:  ?   Effort: Pulmonary effort is normal.  ?   Breath sounds: Normal breath sounds.  ?Abdominal:  ?   General: Bowel sounds are normal.  ?   Palpations: Abdomen is soft.  ?Musculoskeletal:     ?   General: No tenderness or deformity. Normal range of motion.  ?   Cervical  back: Normal range of motion.  ?   Comments: Her extremities shows decreased range of motion of the right shoulder.  She can only move the shoulder up to 90 degrees.  She has very little internal/external rotation.  ?Lymphadenopathy:  ?   Cervical: No cervical adenopathy.  ?Skin: ?   General: Skin is warm and dry.  ?   Findings: No erythema or rash.  ?Neurological:  ?   Mental Status: She is alert and oriented to person, place, and time.  ?Psychiatric:     ?   Behavior: Behavior normal.     ?   Thought Content: Thought content normal.     ?   Judgment: Judgment normal.  ? ? ? ?Lab Results  ?Component Value Date  ? WBC 5.7 07/30/2021  ? HGB 11.0 (L) 07/30/2021  ? HCT 34.1 (L) 07/30/2021  ? MCV 87.2 07/30/2021  ? PLT 394 07/30/2021  ? ?  Chemistry   ?   ?Component Value Date/Time  ? NA 137 07/30/2021 1307  ? NA 139 10/10/2015 0938  ? K 3.9 07/30/2021 1307  ? K 4.2 10/10/2015 0938  ? CL 104 07/30/2021 1307  ? CL 105 04/11/2015 0953  ? CO2 28 07/30/2021 1307  ? CO2 25 10/10/2015 0938  ? BUN 12 07/30/2021 1307  ? BUN 13.6 10/10/2015 0938  ? CREATININE 1.01 (H) 07/30/2021 1307  ? CREATININE 1.0 10/10/2015 0938  ?    ?Component Value Date/Time  ? CALCIUM 10.2 07/30/2021 1307  ? CALCIUM 9.7 10/10/2015 0938  ? ALKPHOS 81 07/30/2021 1307  ? ALKPHOS 104 10/10/2015 0938  ? AST 14 (L) 07/30/2021 1307  ? AST 18 10/10/2015 0938  ? ALT 13 07/30/2021 1307  ? ALT 20 10/10/2015 0938  ? BILITOT 0.9 07/30/2021 1307  ? BILITOT 0.80 10/10/2015 0938  ?  ? ? ?Impression and Plan: ?Kelly Chase is a very nice 76 year old African-American female.  She has IgG kappa MGUS.  This really is not much of an issue.  There really is not much change in 5 years. ? ?Think the problem right now is the pain in the right shoulder.  Again by her range of motion, I have to suspect that this is going to be a problem with her rotator cuff. ? ?I do think an MRI is indicated.  I do still think a plain film will show [place] the problem. ? ?We will set her up with the  MRI next week.  If we do see a problem, then she will be referred to Orthopedic Surgery. ? ?For the MGUS, I think  we get her back in 6 months.  I think this would be very reasonable. ? ? ?Volanda Napoleon, MD ?5/11/20232:03 PM  ?

## 2021-07-31 ENCOUNTER — Other Ambulatory Visit: Payer: Self-pay | Admitting: *Deleted

## 2021-07-31 DIAGNOSIS — D472 Monoclonal gammopathy: Secondary | ICD-10-CM

## 2021-07-31 DIAGNOSIS — M25511 Pain in right shoulder: Secondary | ICD-10-CM

## 2021-07-31 LAB — IGG, IGA, IGM
IgA: 163 mg/dL (ref 64–422)
IgG (Immunoglobin G), Serum: 3171 mg/dL — ABNORMAL HIGH (ref 586–1602)
IgM (Immunoglobulin M), Srm: 109 mg/dL (ref 26–217)

## 2021-07-31 LAB — KAPPA/LAMBDA LIGHT CHAINS
Kappa free light chain: 87.3 mg/L — ABNORMAL HIGH (ref 3.3–19.4)
Kappa, lambda light chain ratio: 7.04 — ABNORMAL HIGH (ref 0.26–1.65)
Lambda free light chains: 12.4 mg/L (ref 5.7–26.3)

## 2021-08-01 ENCOUNTER — Ambulatory Visit (HOSPITAL_BASED_OUTPATIENT_CLINIC_OR_DEPARTMENT_OTHER): Payer: Medicare Other

## 2021-08-01 ENCOUNTER — Other Ambulatory Visit (HOSPITAL_BASED_OUTPATIENT_CLINIC_OR_DEPARTMENT_OTHER): Payer: Medicare Other

## 2021-08-05 LAB — IMMUNOFIXATION REFLEX, SERUM
IgA: 170 mg/dL (ref 64–422)
IgG (Immunoglobin G), Serum: 3304 mg/dL — ABNORMAL HIGH (ref 586–1602)
IgM (Immunoglobulin M), Srm: 111 mg/dL (ref 26–217)

## 2021-08-05 LAB — PROTEIN ELECTROPHORESIS, SERUM, WITH REFLEX
A/G Ratio: 0.9 (ref 0.7–1.7)
Albumin ELP: 4 g/dL (ref 2.9–4.4)
Alpha-1-Globulin: 0.3 g/dL (ref 0.0–0.4)
Alpha-2-Globulin: 0.5 g/dL (ref 0.4–1.0)
Beta Globulin: 1 g/dL (ref 0.7–1.3)
Gamma Globulin: 2.6 g/dL — ABNORMAL HIGH (ref 0.4–1.8)
Globulin, Total: 4.5 g/dL — ABNORMAL HIGH (ref 2.2–3.9)
M-Spike, %: 2 g/dL — ABNORMAL HIGH
SPEP Interpretation: 0
Total Protein ELP: 8.5 g/dL (ref 6.0–8.5)

## 2022-01-29 ENCOUNTER — Inpatient Hospital Stay: Payer: Medicare Other | Admitting: Hematology & Oncology

## 2022-01-29 ENCOUNTER — Inpatient Hospital Stay: Payer: Medicare Other | Attending: Hematology & Oncology

## 2023-04-01 ENCOUNTER — Encounter: Payer: Self-pay | Admitting: *Deleted

## 2023-04-01 NOTE — Progress Notes (Signed)
 Patient is currently admitted in the [facility] She was previously seen here for MGUS, but we received a call from the attending physician stating that there was concern that she had progressed into Multiple Myeloma.  Reached out to Olivette J Hissong to introduce myself as the office RN Navigator and explain our new patient process. Reviewed the reason for their referral and scheduled their new patient appointment along with labs. Provided address and directions to the office including call back phone number. Reviewed with patient any concerns they may have or any possible barriers to attending their appointment.   Informed patient about my role as a navigator and that I will meet with them prior to their New Patient appointment and more fully discuss what services I can provide. At this time patient has no further questions or needs.    Patient is still hospitalized and didn't have anything to document her appointment with. We agreed that I would call back on Monday and provide her with specifics. She is aware that the appointment is Wednesday morning.   Oncology Nurse Navigator Documentation     04/01/2023   11:30 AM  Oncology Nurse Navigator Flowsheets  Navigator Follow Up Date: 04/04/2023  Navigator Follow Up Reason: Patient Call  Navigator Location [facility]  Referral Date to RadOnc/MedOnc 03/30/2023  Navigator Encounter Type Introductory Phone Call  Patient Visit Type MedOnc  Treatment Phase Abnormal Labs  Barriers/Navigation Needs Coordination of Care;Education  Education Other  Interventions Coordination of Care;Education  Acuity Level 2-Minimal Needs (1-2 Barriers Identified)  Coordination of Care Appts  Education Method Verbal  Time Spent with Patient 30

## 2023-04-04 ENCOUNTER — Encounter: Payer: Self-pay | Admitting: *Deleted

## 2023-04-04 NOTE — Progress Notes (Signed)
 Called and spoke to patient. She is aware of appointment including date, time and location.   Oncology Nurse Navigator Documentation     04/04/2023    9:00 AM  Oncology Nurse Navigator Flowsheets  Navigator Follow Up Date: 04/06/2023  Navigator Follow Up Reason: New Patient Appointment  Navigator Location [facility]  Navigator Encounter Type Telephone  Telephone Outgoing Call  Patient Visit Type MedOnc  Treatment Phase Abnormal Labs  Barriers/Navigation Needs Coordination of Care;Education  Education Other  Interventions Education  Acuity Level 2-Minimal Needs (1-2 Barriers Identified)  Education Method Verbal;Teach-back  Time Spent with Patient 15

## 2023-04-06 ENCOUNTER — Inpatient Hospital Stay: Payer: Medicare PPO | Attending: Hematology & Oncology

## 2023-04-06 ENCOUNTER — Other Ambulatory Visit: Payer: Self-pay | Admitting: *Deleted

## 2023-04-06 ENCOUNTER — Inpatient Hospital Stay: Payer: Medicare PPO | Admitting: Hematology & Oncology

## 2023-04-06 ENCOUNTER — Encounter: Payer: Self-pay | Admitting: *Deleted

## 2023-04-06 ENCOUNTER — Telehealth (HOSPITAL_COMMUNITY): Payer: Self-pay

## 2023-04-06 ENCOUNTER — Encounter: Payer: Self-pay | Admitting: Hematology & Oncology

## 2023-04-06 VITALS — BP 134/63 | HR 93 | Temp 98.1°F | Resp 24 | Ht 66.0 in | Wt 162.0 lb

## 2023-04-06 DIAGNOSIS — Z5111 Encounter for antineoplastic chemotherapy: Secondary | ICD-10-CM | POA: Insufficient documentation

## 2023-04-06 DIAGNOSIS — Z79899 Other long term (current) drug therapy: Secondary | ICD-10-CM | POA: Diagnosis not present

## 2023-04-06 DIAGNOSIS — D472 Monoclonal gammopathy: Secondary | ICD-10-CM

## 2023-04-06 DIAGNOSIS — C9000 Multiple myeloma not having achieved remission: Secondary | ICD-10-CM | POA: Insufficient documentation

## 2023-04-06 DIAGNOSIS — D649 Anemia, unspecified: Secondary | ICD-10-CM | POA: Diagnosis not present

## 2023-04-06 DIAGNOSIS — Z5112 Encounter for antineoplastic immunotherapy: Secondary | ICD-10-CM | POA: Insufficient documentation

## 2023-04-06 HISTORY — DX: Multiple myeloma not having achieved remission: C90.00

## 2023-04-06 LAB — CMP (CANCER CENTER ONLY)
ALT: 87 U/L — ABNORMAL HIGH (ref 0–44)
AST: 28 U/L (ref 15–41)
Albumin: 3.4 g/dL — ABNORMAL LOW (ref 3.5–5.0)
Alkaline Phosphatase: 53 U/L (ref 38–126)
Anion gap: 3 — ABNORMAL LOW (ref 5–15)
BUN: 49 mg/dL — ABNORMAL HIGH (ref 8–23)
CO2: 21 mmol/L — ABNORMAL LOW (ref 22–32)
Calcium: 8.1 mg/dL — ABNORMAL LOW (ref 8.9–10.3)
Chloride: 103 mmol/L (ref 98–111)
Creatinine: 1.91 mg/dL — ABNORMAL HIGH (ref 0.44–1.00)
GFR, Estimated: 27 mL/min — ABNORMAL LOW (ref >60)
Glucose, Bld: 122 mg/dL — ABNORMAL HIGH (ref 70–99)
Potassium: 4 mmol/L (ref 3.5–5.1)
Sodium: 127 mmol/L — ABNORMAL LOW (ref 135–145)
Total Bilirubin: 0.4 mg/dL (ref 0.0–1.2)
Total Protein: 11.8 g/dL — ABNORMAL HIGH (ref 6.5–8.1)

## 2023-04-06 LAB — CBC WITH DIFFERENTIAL (CANCER CENTER ONLY)
Abs Immature Granulocytes: 0.09 10*3/uL — ABNORMAL HIGH (ref 0.00–0.07)
Basophils Absolute: 0 10*3/uL (ref 0.0–0.1)
Basophils Relative: 0 %
Eosinophils Absolute: 0 10*3/uL (ref 0.0–0.5)
Eosinophils Relative: 0 %
HCT: 26.2 % — ABNORMAL LOW (ref 36.0–46.0)
Hemoglobin: 8.4 g/dL — ABNORMAL LOW (ref 12.0–15.0)
Immature Granulocytes: 1 %
Lymphocytes Relative: 11 %
Lymphs Abs: 1.1 10*3/uL (ref 0.7–4.0)
MCH: 28.8 pg (ref 26.0–34.0)
MCHC: 32.1 g/dL (ref 30.0–36.0)
MCV: 89.7 fL (ref 80.0–100.0)
Monocytes Absolute: 0.4 10*3/uL (ref 0.1–1.0)
Monocytes Relative: 4 %
Neutro Abs: 8.2 10*3/uL — ABNORMAL HIGH (ref 1.7–7.7)
Neutrophils Relative %: 84 %
Platelet Count: 229 10*3/uL (ref 150–400)
RBC: 2.92 MIL/uL — ABNORMAL LOW (ref 3.87–5.11)
RDW: 15.5 % (ref 11.5–15.5)
WBC Count: 9.8 10*3/uL (ref 4.0–10.5)
nRBC: 0 % (ref 0.0–0.2)

## 2023-04-06 LAB — RETICULOCYTES
Immature Retic Fract: 15.8 % (ref 2.3–15.9)
RBC.: 2.89 MIL/uL — ABNORMAL LOW (ref 3.87–5.11)
Retic Count, Absolute: 48.3 10*3/uL (ref 19.0–186.0)
Retic Ct Pct: 1.7 % (ref 0.4–3.1)

## 2023-04-06 LAB — LACTATE DEHYDROGENASE: LDH: 246 U/L — ABNORMAL HIGH (ref 98–192)

## 2023-04-06 NOTE — Telephone Encounter (Signed)
Called to schedule port, no answer, left vm. AB

## 2023-04-06 NOTE — Progress Notes (Signed)
Hematology and Oncology Follow Up Visit  Kelly Chase [id number] 1946-02-17 78 y.o. 04/06/2023   Principle Diagnosis:  IgG kappa myeloma  --evolved from MGUS  Current Therapy:   Faspro/Velcade/Cytoxan/Decadron - start cycle #1 on 04/13/2023 Zometa 3.3 mg IV q 3 months      Interim History:  Kelly Chase is back for follow-up.  We last saw her a couple years ago.  Unfortunately, looks like she has clearly progressed to myeloma.  She she was admitted to [facility].  This was a week or so ago.  She had marked anemia.  She was having back discomfort.  She had MRI of the spine.  This showed abnormal bone marrow.  She had multiple lesions.  She had some osteoarthritic changes.  She had a compression fracture at L5.  This was 40%.  She was transfused.  She had I think a bone survey which did show lytic lesions in her bones.  She was subsequently discharged.  She now is back to see [place].  That she clearly has progressed.  Her total protein today is 11.8.  Only last saw her, her monoclonal spike was 2 g/dL.  Her IgG level was 3300 mg/dL.  Her Kappa light chain was 8.7 mg/dL.  I am sure that her levels have gone up significantly.  Again, she is still in good shape.  I think that she would be a good candidate for systemic therapy.  I think that we can certainly help her with treatment.  I do think that she probably is going to need to have a Port-A-Cath put in.  I talked to her about this.  Will also need to do Zometa.  I know she has some renal insufficiency so Zometa will be dose reduced.  I think that a good protocol for her would be Faspro/Velcade/Cytoxan/prednisone.  I just worried that she has a renal insufficiency that would make using Revlimid/pomalidomide a little bit challenging.  I do think a PET scan would also be helpful for her.  Again, I just hate the fact that she has progressed.  I will look into seeing about a vertebroplasty for the L5 compression  fracture.  Overall, I would say performance status is probably ECOG 1.  Medications:  Current Outpatient Medications:    acetaminophen (TYLENOL) 500 MG tablet, Take 1,000 mg by mouth every 6 (six) hours as needed., Disp: , Rfl:    amLODipine (NORVASC) 10 MG tablet, TAKE 1 TABLET(10 MG) BY MOUTH DAILY, Disp: 90 tablet, Rfl: 0   dexamethasone (DECADRON) 4 MG tablet, Take 40 mg by mouth. Take 3 days., Disp: , Rfl:    gabapentin (NEURONTIN) 100 MG capsule, Take by mouth at bedtime as needed., Disp: , Rfl:    lisinopril (PRINIVIL,ZESTRIL) 20 MG tablet, Take 1 tablet (20 mg total) by mouth daily. (Patient not taking: Reported on 04/06/2023), Disp: 90 tablet, Rfl: 3  Allergies:  Allergies  Allergen Reactions   Celecoxib Other (See Comments)    GI upset   Esomeprazole Other (See Comments)    Joint pain   Feldene [Piroxicam] Other (See Comments)    abd "burning"   Chlorthalidone Palpitations    Past Medical History, Surgical history, Social history, and Family History were reviewed and updated.  Review of Systems: Review of Systems  Constitutional: Negative.   HENT:  Negative.    Eyes: Negative.   Respiratory: Negative.    Cardiovascular: Negative.   Gastrointestinal: Negative.   Endocrine: Negative.   Genitourinary: Negative.  Musculoskeletal:  Positive for arthralgias and myalgias.  Skin: Negative.   Neurological: Negative.   Hematological: Negative.   Psychiatric/Behavioral: Negative.      Physical Exam:  height is 5\' 6"  (1.676 m) and weight is 162 lb (73.5 kg). Her oral temperature is 98.1 F (36.7 C). Her blood pressure is 134/63 and her pulse is 93. Her respiration is 24 (abnormal) and oxygen saturation is 100%.   Wt Readings from Last 3 Encounters:  04/06/23 162 lb (73.5 kg)  07/30/21 180 lb (81.6 kg)  03/09/21 183 lb (83 kg)    Physical Exam Vitals reviewed.  HENT:     Head: Normocephalic and atraumatic.  Eyes:     Pupils: Pupils are equal, round, and  reactive to light.  Cardiovascular:     Rate and Rhythm: Normal rate and regular rhythm.     Heart sounds: Normal heart sounds.  Pulmonary:     Effort: Pulmonary effort is normal.     Breath sounds: Normal breath sounds.  Abdominal:     General: Bowel sounds are normal.     Palpations: Abdomen is soft.  Musculoskeletal:        General: No tenderness or deformity. Normal range of motion.     Cervical back: Normal range of motion.     Comments: Her extremities shows decreased range of motion of the right shoulder.  She can only move the shoulder up to 90 degrees.  She has very little internal/external rotation.  Lymphadenopathy:     Cervical: No cervical adenopathy.  Skin:    General: Skin is warm and dry.     Findings: No erythema or rash.  Neurological:     Mental Status: She is alert and oriented to person, place, and time.  Psychiatric:        Behavior: Behavior normal.        Thought Content: Thought content normal.        Judgment: Judgment normal.     Lab Results  Component Value Date   WBC 9.8 04/06/2023   HGB 8.4 (L) 04/06/2023   HCT 26.2 (L) 04/06/2023   MCV 89.7 04/06/2023   PLT 229 04/06/2023     Chemistry      Component Value Date/Time   NA 127 (L) 04/06/2023 1117   NA 139 10/10/2015 0938   K 4.0 04/06/2023 1117   K 4.2 10/10/2015 0938   CL 103 04/06/2023 1117   CL 105 04/11/2015 0953   CO2 21 (L) 04/06/2023 1117   CO2 25 10/10/2015 0938   BUN 49 (H) 04/06/2023 1117   BUN 13.6 10/10/2015 0938   CREATININE 1.91 (H) 04/06/2023 1117   CREATININE 1.0 10/10/2015 0938      Component Value Date/Time   CALCIUM 8.1 (L) 04/06/2023 1117   CALCIUM 9.7 10/10/2015 0938   ALKPHOS 53 04/06/2023 1117   ALKPHOS 104 10/10/2015 0938   AST 28 04/06/2023 1117   AST 18 10/10/2015 0938   ALT 87 (H) 04/06/2023 1117   ALT 20 10/10/2015 0938   BILITOT 0.4 04/06/2023 1117   BILITOT 0.80 10/10/2015 0938      Impression and Plan: Kelly Chase is a very nice 78 year old  African-American female.  She has a history of IgG kappa MGUS.  Now, she clearly has progressed.  We are had to start her on systemic therapy.  I feel confident that we should have a good response to systemic therapy.  We will see if a vertebroplasty can be done at  L5.  I think this will help her out with the discomfort that she has.  We will also give her Zometa.  She will need to have a Port-A-Cath placed.  I think this will be very helpful.  We will also need to see about a PET scan for her.  et her back in 6 months.  I think this would be very reasonable.   Josph Macho, MD 1/15/20251:28 PM

## 2023-04-06 NOTE — Progress Notes (Signed)
 Initial RN Navigator Patient Visit  Name: Kelly Chase Date of Referral : 03/30/2023 Diagnosis: Multiple Myeloma  Patient is an previously established patient send for MGUS who has now progressed to Multiple Myeloma.   Met with patient prior to their visit with MD. Wheeler Hammonds patient "Your Patient Navigator" handout which explains my role, areas in which I am able to help, and all the contact information for myself and the office. Also gave patient MD and Navigator business card. Reviewed with patient the general overview of expected course after initial diagnosis and time frame for all steps to be completed.  New patient packet given to patient which includes: orientation to office and staff; campus directory; education on My Chart and Advance Directives; and patient centered education on multiple myeloma.   Referral to social work and nutrition placed per protocol.   Patient had bone marrow done while inpatient at [facility]. Cytogenetics were pending, but Dr Maria Shiner request FISH also be run. Spoke to [employer] in the [facility] FISH lab [phone]). Verbal order given for MM FISH.   Will follow up tomorrow once note dictated and orders placed.   Patient understands all follow up procedures and expectations. They have my number to reach out for any further clarification or additional needs.   Oncology Nurse Navigator Documentation     04/06/2023   11:00 AM  Oncology Nurse Navigator Flowsheets  Confirmed Diagnosis Date 04/01/2023  Navigator Follow Up Date: 04/07/2023  Navigator Follow Up Reason: Appointment Review  Navigator Location [facility]  Navigator Encounter Type Initial MedOnc  Patient Visit Type MedOnc  Treatment Phase Pre-Tx/Tx Discussion  Barriers/Navigation Needs Coordination of Care;Education  Education Newly Diagnosed Cancer Education;Pain/ Symptom Management  Interventions Coordination of Care;Education;Psycho-Social Support;Referrals  Acuity Level  2-Minimal Needs (1-2 Barriers Identified)  Referrals Nutrition/dietician;Social Work  Coordination of Care Pathology  Education Method Verbal;Written  Time Spent with Patient 90

## 2023-04-07 ENCOUNTER — Encounter: Payer: Self-pay | Admitting: Hematology & Oncology

## 2023-04-07 ENCOUNTER — Encounter: Payer: Self-pay | Admitting: *Deleted

## 2023-04-07 LAB — ERYTHROPOIETIN: Erythropoietin: 7.1 m[IU]/mL (ref 2.6–18.5)

## 2023-04-07 NOTE — Progress Notes (Signed)
START ON PATHWAY REGIMEN - Multiple Myeloma and Other Plasma Cell Dyscrasias     A cycle is every 28 days:     Dexamethasone      Bortezomib      Cyclophosphamide   **Always confirm dose/schedule in your pharmacy ordering system**  Patient Characteristics: Multiple Myeloma, Newly Diagnosed, Transplant Ineligible or Refused, Standard Risk Disease Classification: Multiple Myeloma Therapeutic Status: Newly Diagnosed R2-ISS Staging: III Is Patient Eligible for Transplant<= Transplant Ineligible or Refused Risk Status: Standard Risk Intent of Therapy: Curative Intent, Discussed with Patient

## 2023-04-07 NOTE — Progress Notes (Signed)
Plan for patient to start chemo. She will need port, PET, chemo education and chemo start scheduled. Dr Myna Hidalgo will also reach out about possible vertebroplasty.   PET scheduled for 04/15/2023. Will await chemo auth and scheduling to coordinate chemo education.   Patient is aware of PET appointment including date, time, and location. The following prep is reviewed with patient and confirmed with teachback: - arrive 30 minutes before appointment time - NPO except water for 6h before scan. No candy, no gum - hold any diabetic medication the morning of the scan - have a low carb dinner the night prior Radiology Information sheet also mailed to patient's home for reinforcement of education.   Port scheduled for 04/12/2023. Patient is aware of the following: pt to arrive in [facility]sc at 7am, npo, driver, may take am meds with sips of water, 24 hr supr .  Patient stated her son would come tonight and get all the information. I encouraged her to let her son know that he can call me with any questions.   Oncology Nurse Navigator Documentation     04/07/2023   12:45 PM  Oncology Nurse Navigator Flowsheets  Navigator Follow Up Date: 04/15/2023  Navigator Follow Up Reason: Scan Review  Navigator Location [facility]  Navigator Encounter Type Appt/Treatment Plan Review;Telephone  Telephone Outgoing Call  Patient Visit Type MedOnc  Treatment Phase Pre-Tx/Tx Discussion  Barriers/Navigation Needs Coordination of Care;Education  Education Other  Interventions Coordination of Care;Education;Psycho-Social Support  Acuity Level 2-Minimal Needs (1-2 Barriers Identified)  Coordination of Care Radiology  Education Method Verbal;Teach-back  Support Groups/Services Friends and Family  Time Spent with Patient 45

## 2023-04-07 NOTE — Addendum Note (Signed)
Addended by: Arlan Organ R on: 04/07/2023 05:25 PM   Modules accepted: Orders

## 2023-04-08 ENCOUNTER — Other Ambulatory Visit: Payer: Self-pay

## 2023-04-08 ENCOUNTER — Encounter: Payer: Self-pay | Admitting: Hematology & Oncology

## 2023-04-08 ENCOUNTER — Other Ambulatory Visit: Payer: Self-pay | Admitting: *Deleted

## 2023-04-08 ENCOUNTER — Encounter: Payer: Self-pay | Admitting: *Deleted

## 2023-04-08 DIAGNOSIS — C9000 Multiple myeloma not having achieved remission: Secondary | ICD-10-CM

## 2023-04-08 DIAGNOSIS — M25511 Pain in right shoulder: Secondary | ICD-10-CM

## 2023-04-08 LAB — IGG, IGA, IGM
IgA: 51 mg/dL — ABNORMAL LOW (ref 64–422)
IgG (Immunoglobin G), Serum: 7394 mg/dL — ABNORMAL HIGH (ref 586–1602)
IgM (Immunoglobulin M), Srm: 34 mg/dL (ref 26–217)

## 2023-04-08 LAB — KAPPA/LAMBDA LIGHT CHAINS
Kappa free light chain: 1847.5 mg/L — ABNORMAL HIGH (ref 3.3–19.4)
Kappa, lambda light chain ratio: 439.88 — ABNORMAL HIGH (ref 0.26–1.65)
Lambda free light chains: 4.2 mg/L — ABNORMAL LOW (ref 5.7–26.3)

## 2023-04-08 MED ORDER — CYCLOBENZAPRINE HCL 5 MG PO TABS: 5.0000 mg | ORAL_TABLET | Freq: Three times a day (TID) | ORAL | 0 refills | Status: DC | PRN

## 2023-04-08 NOTE — Progress Notes (Signed)
Patient c/o back spasms and wants to know if she can take anything for the pain. Spoke with Dr Myna Hidalgo. He will send in prescription for Flexeril.   Patient is aware of new prescription and instructions. Confirmed pharmacy.   Oncology Nurse Navigator Documentation     04/08/2023    1:00 PM  Oncology Nurse Navigator Flowsheets  Navigator Follow Up Date: 04/15/2023  Navigator Follow Up Reason: Scan Review  Navigator Location [facility]  Navigator Encounter Type Telephone  Telephone Incoming Call  Patient Visit Type MedOnc  Treatment Phase Pre-Tx/Tx Discussion  Barriers/Navigation Needs Coordination of Care;Education  Education Other  Interventions Medication Assistance;Education  Acuity Level 2-Minimal Needs (1-2 Barriers Identified)  Education Method Verbal  Support Groups/Services Friends and Family  Time Spent with Patient 15

## 2023-04-10 ENCOUNTER — Other Ambulatory Visit: Payer: Self-pay

## 2023-04-11 ENCOUNTER — Other Ambulatory Visit: Payer: Self-pay | Admitting: Radiology

## 2023-04-11 ENCOUNTER — Telehealth: Payer: Self-pay

## 2023-04-11 ENCOUNTER — Other Ambulatory Visit: Payer: Self-pay | Admitting: Hematology & Oncology

## 2023-04-11 ENCOUNTER — Encounter: Payer: Self-pay | Admitting: Hematology & Oncology

## 2023-04-11 DIAGNOSIS — C9000 Multiple myeloma not having achieved remission: Secondary | ICD-10-CM

## 2023-04-11 NOTE — Addendum Note (Signed)
Addended by: Josph Macho on: 04/11/2023 01:47 PM   Modules accepted: Orders

## 2023-04-11 NOTE — Telephone Encounter (Signed)
Clinical Social Work was referred by medical provider for assessment of psychosocial needs.  CSW attempted to contact patient by phone.  Left voicemail with contact information and request for return call.

## 2023-04-11 NOTE — H&P (Signed)
Chief Complaint: Patient was seen in consultation today for multiple myeloma; port-a-catheter placement.   Referring Physician(s): Ennever,Peter R  Supervising Physician: Marliss Coots  Patient Status: [facility] - Out-pt  History of Present Illness: Kelly Chase is a 78 y.o. female with a medical history significant for HTN and IgG kappa MGUS. She was recently admitted at [facility] and an MRI of the spine showed abnormal bone marrow with multiple lesions. Subsequent lab work showed findings consistent with progression to Multiple Myeloma and she was seen in IR 04/01/23 for a bone marrow biopsy with aspiration. This confirmed the diagnosis and her oncology team is preparing her for systemic therapy. Interventional Radiology has been asked to evaluate this patient for an image-guided port-a-catheter placement to facilitate her treatment goals.   Past Medical History:  Diagnosis Date   Acid reflux    Cancer (HCC)    cervical cancer   Hypertension    MGUS (monoclonal gammopathy of unknown significance) 09/24/2014   Multiple myeloma (HCC) 04/06/2023    Past Surgical History:  Procedure Laterality Date   ABDOMINAL HYSTERECTOMY     BREAST CYST EXCISION Right    BREAST SURGERY     cyst removed   CATARACT EXTRACTION W/ INTRAOCULAR LENS IMPLANT Left    EYE SURGERY     KNEE ARTHROSCOPY     left knee   LUMBAR LAMINECTOMY/DECOMPRESSION MICRODISCECTOMY Right 12/29/2017   Procedure: Right Lumbar Four-Five Laminectomy for facet/synovial cyst;  Surgeon: Coletta Memos, MD;  Location: [facility] OR;  Service: Neurosurgery;  Laterality: Right;  Right Lumbar Four-Five Laminectomy for facet/synovial cyst    Allergies: Celecoxib, Esomeprazole, Feldene [piroxicam], and Chlorthalidone  Medications: Prior to Admission medications   Medication Sig Start Date End Date Taking? Authorizing Provider  acetaminophen (TYLENOL) 500 MG tablet Take 1,000 mg by mouth every 6 (six) hours as  needed.    [provider]  amLODipine (NORVASC) 10 MG tablet TAKE 1 TABLET(10 MG) BY MOUTH DAILY 07/12/19   Mliss Sax, MD  cyclobenzaprine (FLEXERIL) 5 MG tablet Take 1 tablet (5 mg total) by mouth every 8 (eight) hours as needed for muscle spasms. 04/08/23   Josph Macho, MD  dexamethasone (DECADRON) 4 MG tablet Take 40 mg by mouth. Take 3 days. 04/04/23   [provider]  gabapentin (NEURONTIN) 100 MG capsule Take by mouth at bedtime as needed. 04/01/23   [provider]  lisinopril (PRINIVIL,ZESTRIL) 20 MG tablet Take 1 tablet (20 mg total) by mouth daily. Patient not taking: Reported on 04/06/2023 04/21/18   Mliss Sax, MD     No family history on file.  Social History   Socioeconomic History   Marital status: Divorced    Spouse name: Not on file   Number of children: Not on file   Years of education: Not on file   Highest education level: Not on file  Occupational History   Not on file  Tobacco Use   Smoking status: Never   Smokeless tobacco: Never   Tobacco comments:    NEVER USED TOBACCO  Vaping Use   Vaping status: Never Used  Substance and Sexual Activity   Alcohol use: No    Alcohol/week: 0.0 standard drinks of alcohol   Drug use: No   Sexual activity: Not Currently  Other Topics Concern   Not on file  Social History Narrative   Not on file   Social Drivers of Health   Financial Resource Strain: Low Risk  (04/06/2023)  Overall Financial Resource Strain (CARDIA)    Difficulty of Paying Living Expenses: Not hard at all  Food Insecurity: Low Risk  (03/30/2023)   Received from [facility]   Hunger Vital Sign    Worried About Running Out of Food in the Last Year: Never true    Ran Out of Food in the Last Year: Never true  Transportation Needs: No Transportation Needs (03/30/2023)   Received from [facility]    In the past 12 months, has lack of reliable transportation kept you from medical  appointments, meetings, work or from getting things needed for daily living? : No  Physical Activity: Inactive (04/06/2023)   Exercise Vital Sign    Days of Exercise per Week: 0 days    Minutes of Exercise per Session: 0 min  Stress: No Stress Concern Present (04/06/2023)   [employer] of Occupational Health - Occupational Stress Questionnaire    Feeling of Stress : Not at all  Social Connections: Moderately Integrated (04/06/2023)   Social Connection and Isolation Panel [NHANES]    Frequency of Communication with Friends and Family: More than three times a week    Frequency of Social Gatherings with Friends and Family: Twice a week    Attends Religious Services: More than 4 times per year    Active Member of [employer] or Organizations: No    Attends Engineer, structural: More than 4 times per year    Marital Status: Divorced    Review of Systems: A 12 point ROS discussed and pertinent positives are indicated in the HPI above.  All other systems are negative.  Review of Systems  Constitutional:  Negative for appetite change and fatigue.  Respiratory:  Negative for cough and shortness of breath.   Cardiovascular:  Negative for chest pain and leg swelling.  Gastrointestinal:  Negative for abdominal pain, diarrhea, nausea and vomiting.  Genitourinary:  Negative for flank pain.  Musculoskeletal:  Positive for back pain.  Neurological:  Negative for dizziness and headaches.    Vital Signs: BP (!) 146/72   Pulse 90   Temp 97.8 F (36.6 C) (Oral)   Resp 16   Ht 5\' 6"  (1.676 m)   Wt 162 lb (73.5 kg)   SpO2 100%   BMI 26.15 kg/m   Physical Exam Constitutional:      General: She is not in acute distress.    Appearance: She is not ill-appearing.  HENT:     Mouth/Throat:     Mouth: Mucous membranes are moist.  Cardiovascular:     Rate and Rhythm: Normal rate.     Pulses: Normal pulses.  Pulmonary:     Effort: Pulmonary effort is normal.  Abdominal:     Palpations:  Abdomen is soft.     Tenderness: There is no abdominal tenderness.  Musculoskeletal:     Right lower leg: No edema.     Left lower leg: No edema.  Skin:    General: Skin is warm and dry.  Neurological:     Mental Status: She is alert and oriented to person, place, and time.  Psychiatric:        Mood and Affect: Mood normal.        Behavior: Behavior normal.        Thought Content: Thought content normal.        Judgment: Judgment normal.     Imaging: No results found.  Labs:  CBC: Recent Labs    04/06/23 1117  WBC 9.8  HGB 8.4*  HCT 26.2*  PLT 229    COAGS: No results for input(s): "INR", "APTT" in the last 8760 hours.  BMP: Recent Labs    04/06/23 1117  NA 127*  K 4.0  CL 103  CO2 21*  GLUCOSE 122*  BUN 49*  CALCIUM 8.1*  CREATININE 1.91*  GFRNONAA 27*    LIVER FUNCTION TESTS: Recent Labs    04/06/23 1117  BILITOT 0.4  AST 28  ALT 87*  ALKPHOS 53  PROT 11.8*  ALBUMIN 3.4*    TUMOR MARKERS: No results for input(s): "AFPTM", "CEA", "CA199", "CHROMGRNA" in the last 8760 hours.  Assessment and Plan:  Multiple myeloma; pending chemotherapy: Kelly Chase, 78 year old female, presents today to the [facility] Interventional Radiology department for an image-guided port-a-catheter placement.   Risks and benefits of image-guided port-a-catheter placement were discussed with the patient including, but not limited to bleeding, infection, pneumothorax, or fibrin sheath development and need for additional procedures.  All of the patient's questions were answered, patient is agreeable to proceed. She has been NPO. She is a full code.   Consent signed and in chart.  Thank you for this interesting consult.  I greatly enjoyed meeting Kelly Chase and look forward to participating in their care.  A copy of this report was sent to the requesting provider on this date.  Electronically Signed: Alwyn Ren, AGACNP-BC 04/12/2023, 8:53 AM   I spent  a total of  30 Minutes   in face to face in clinical consultation, greater than 50% of which was counseling/coordinating care for port-a-catheter placement.

## 2023-04-12 ENCOUNTER — Ambulatory Visit (HOSPITAL_COMMUNITY)
Admission: RE | Admit: 2023-04-12 | Discharge: 2023-04-12 | Disposition: A | Discharge status: Home / Self Care | Payer: Medicare PPO | Source: Ambulatory Visit | Attending: Hematology & Oncology | Admitting: Hematology & Oncology

## 2023-04-12 DIAGNOSIS — C9000 Multiple myeloma not having achieved remission: Secondary | ICD-10-CM | POA: Insufficient documentation

## 2023-04-12 DIAGNOSIS — I10 Essential (primary) hypertension: Secondary | ICD-10-CM | POA: Insufficient documentation

## 2023-04-12 HISTORY — PX: IR IMAGING GUIDED PORT INSERTION: IMG5740

## 2023-04-12 MED ORDER — FENTANYL CITRATE (PF) 100 MCG/2ML IJ SOLN
INTRAMUSCULAR | Status: AC | PRN
  Administered 2023-04-12: 25 ug via INTRAVENOUS

## 2023-04-12 MED ORDER — FENTANYL CITRATE (PF) 100 MCG/2ML IJ SOLN
INTRAMUSCULAR | Status: AC
  Filled 2023-04-12: qty 2

## 2023-04-12 MED ORDER — HEPARIN SOD (PORK) LOCK FLUSH 100 UNIT/ML IV SOLN
INTRAVENOUS | Status: AC
  Filled 2023-04-12: qty 5

## 2023-04-12 MED ORDER — SODIUM CHLORIDE 0.9 % IV SOLN: INTRAVENOUS | Status: DC

## 2023-04-12 MED ORDER — MIDAZOLAM HCL 2 MG/2ML IJ SOLN
INTRAMUSCULAR | Status: AC | PRN
  Administered 2023-04-12: 1 mg via INTRAVENOUS

## 2023-04-12 MED ORDER — HEPARIN SOD (PORK) LOCK FLUSH 100 UNIT/ML IV SOLN
500.0000 [IU] | Freq: Once | INTRAVENOUS | Status: AC
  Administered 2023-04-12: 500 [IU]

## 2023-04-12 MED ORDER — LIDOCAINE-EPINEPHRINE 1 %-1:100000 IJ SOLN
20.0000 mL | Freq: Once | INTRAMUSCULAR | Status: AC
  Administered 2023-04-12: 18 mL via INTRADERMAL

## 2023-04-12 MED ORDER — MIDAZOLAM HCL 2 MG/2ML IJ SOLN
INTRAMUSCULAR | Status: AC
Start: 2023-04-12 — End: ?
  Filled 2023-04-12: qty 2

## 2023-04-12 MED ORDER — LIDOCAINE-EPINEPHRINE 1 %-1:100000 IJ SOLN
INTRAMUSCULAR | Status: AC
  Filled 2023-04-12: qty 1

## 2023-04-12 NOTE — Procedures (Signed)
Interventional Radiology Procedure Note ° °Procedure: Single Lumen Power Port Placement   ° °Access:  Right internal jugular vein ° °Findings: Catheter tip positioned at cavoatrial junction. Port is ready for immediate use.  ° °Complications: None ° °EBL: < 10 mL ° °Recommendations:  °- Ok to shower in 24 hours °- Do not submerge for 7 days °- Routine line care  ° ° °Mithran Strike, MD ° ° ° °

## 2023-04-14 ENCOUNTER — Inpatient Hospital Stay: Payer: Medicare PPO

## 2023-04-14 ENCOUNTER — Encounter: Payer: Self-pay | Admitting: *Deleted

## 2023-04-14 ENCOUNTER — Other Ambulatory Visit: Payer: Self-pay | Admitting: *Deleted

## 2023-04-14 ENCOUNTER — Other Ambulatory Visit: Payer: Self-pay

## 2023-04-14 VITALS — BP 123/67 | HR 97 | Temp 98.0°F | Resp 18

## 2023-04-14 DIAGNOSIS — C9000 Multiple myeloma not having achieved remission: Secondary | ICD-10-CM

## 2023-04-14 DIAGNOSIS — Z5112 Encounter for antineoplastic immunotherapy: Secondary | ICD-10-CM | POA: Diagnosis not present

## 2023-04-14 DIAGNOSIS — D472 Monoclonal gammopathy: Secondary | ICD-10-CM

## 2023-04-14 LAB — CMP (CANCER CENTER ONLY)
ALT: 31 U/L (ref 0–44)
AST: 19 U/L (ref 15–41)
Albumin: 3.2 g/dL — ABNORMAL LOW (ref 3.5–5.0)
Alkaline Phosphatase: 58 U/L (ref 38–126)
Anion gap: 3 — ABNORMAL LOW (ref 5–15)
BUN: 15 mg/dL (ref 8–23)
CO2: 20 mmol/L — ABNORMAL LOW (ref 22–32)
Calcium: 7.4 mg/dL — ABNORMAL LOW (ref 8.9–10.3)
Chloride: 108 mmol/L (ref 98–111)
Creatinine: 1.35 mg/dL — ABNORMAL HIGH (ref 0.44–1.00)
GFR, Estimated: 40 mL/min — ABNORMAL LOW (ref >60)
Glucose, Bld: 82 mg/dL (ref 70–99)
Potassium: 3.9 mmol/L (ref 3.5–5.1)
Sodium: 131 mmol/L — ABNORMAL LOW (ref 135–145)
Total Bilirubin: 0.6 mg/dL (ref 0.0–1.2)
Total Protein: 11.2 g/dL — ABNORMAL HIGH (ref 6.5–8.1)

## 2023-04-14 LAB — CBC WITH DIFFERENTIAL (CANCER CENTER ONLY)
Abs Immature Granulocytes: 0.06 10*3/uL (ref 0.00–0.07)
Basophils Absolute: 0 10*3/uL (ref 0.0–0.1)
Basophils Relative: 0 %
Eosinophils Absolute: 0.1 10*3/uL (ref 0.0–0.5)
Eosinophils Relative: 1 %
HCT: 25.5 % — ABNORMAL LOW (ref 36.0–46.0)
Hemoglobin: 8.2 g/dL — ABNORMAL LOW (ref 12.0–15.0)
Immature Granulocytes: 1 %
Lymphocytes Relative: 23 %
Lymphs Abs: 1.8 10*3/uL (ref 0.7–4.0)
MCH: 28.6 pg (ref 26.0–34.0)
MCHC: 32.2 g/dL (ref 30.0–36.0)
MCV: 88.9 fL (ref 80.0–100.0)
Monocytes Absolute: 0.3 10*3/uL (ref 0.1–1.0)
Monocytes Relative: 4 %
Neutro Abs: 5.5 10*3/uL (ref 1.7–7.7)
Neutrophils Relative %: 71 %
Platelet Count: 298 10*3/uL (ref 150–400)
RBC: 2.87 MIL/uL — ABNORMAL LOW (ref 3.87–5.11)
RDW: 15.5 % (ref 11.5–15.5)
WBC Count: 7.8 10*3/uL (ref 4.0–10.5)
nRBC: 0 % (ref 0.0–0.2)

## 2023-04-14 MED ORDER — ONDANSETRON HCL 8 MG PO TABS: ORAL_TABLET | ORAL | 1 refills | Status: AC

## 2023-04-14 MED ORDER — HEPARIN SOD (PORK) LOCK FLUSH 100 UNIT/ML IV SOLN
500.0000 [IU] | Freq: Once | INTRAVENOUS | Status: AC
  Administered 2023-04-14: 500 [IU] via INTRAVENOUS

## 2023-04-14 MED ORDER — PROCHLORPERAZINE MALEATE 10 MG PO TABS: 10.0000 mg | ORAL_TABLET | Freq: Four times a day (QID) | ORAL | 1 refills | Status: AC | PRN

## 2023-04-14 MED ORDER — SODIUM CHLORIDE 0.9 % IV SOLN
Freq: Once | INTRAVENOUS | Status: AC
Start: 2023-04-14 — End: 2023-04-14

## 2023-04-14 MED ORDER — MONTELUKAST SODIUM 10 MG PO TABS
10.0000 mg | ORAL_TABLET | Freq: Once | ORAL | Status: AC
  Administered 2023-04-14: 10 mg via ORAL
  Filled 2023-04-14: qty 1

## 2023-04-14 MED ORDER — ACETAMINOPHEN 325 MG PO TABS
650.0000 mg | ORAL_TABLET | Freq: Once | ORAL | Status: AC
  Administered 2023-04-14: 650 mg via ORAL
  Filled 2023-04-14: qty 2

## 2023-04-14 MED ORDER — DIPHENHYDRAMINE HCL 25 MG PO CAPS
50.0000 mg | ORAL_CAPSULE | Freq: Once | ORAL | Status: AC
  Administered 2023-04-14: 50 mg via ORAL
  Filled 2023-04-14: qty 2

## 2023-04-14 MED ORDER — BORTEZOMIB CHEMO SQ INJECTION 3.5 MG (2.5MG/ML)
1.5000 mg/m2 | Freq: Once | INTRAMUSCULAR | Status: AC
Start: 2023-04-14 — End: 2023-04-14
  Administered 2023-04-14: 2.75 mg via SUBCUTANEOUS
  Filled 2023-04-14: qty 1.1

## 2023-04-14 MED ORDER — DEXAMETHASONE 4 MG PO TABS
20.0000 mg | ORAL_TABLET | Freq: Once | ORAL | Status: AC
  Administered 2023-04-14: 20 mg via ORAL
  Filled 2023-04-14: qty 5

## 2023-04-14 MED ORDER — ACYCLOVIR 400 MG PO TABS: 400.0000 mg | ORAL_TABLET | Freq: Two times a day (BID) | ORAL | 5 refills | Status: DC

## 2023-04-14 MED ORDER — SODIUM CHLORIDE 0.9% FLUSH
10.0000 mL | INTRAVENOUS | Status: DC | PRN
Start: 2023-04-14 — End: 2023-04-14
  Administered 2023-04-14: 10 mL via INTRAVENOUS

## 2023-04-14 MED ORDER — SODIUM CHLORIDE 0.9 % IV SOLN
300.0000 mg/m2 | Freq: Once | INTRAVENOUS | Status: AC
  Administered 2023-04-14: 560 mg via INTRAVENOUS
  Filled 2023-04-14: qty 28

## 2023-04-14 MED ORDER — DARATUMUMAB-HYALURONIDASE-FIHJ 1800-30000 MG-UT/15ML ~~LOC~~ SOLN
1800.0000 mg | Freq: Once | SUBCUTANEOUS | Status: AC
  Administered 2023-04-14: 1800 mg via SUBCUTANEOUS
  Filled 2023-04-14: qty 15

## 2023-04-14 NOTE — Patient Instructions (Addendum)
[facility]  Discharge Instructions: Thank you for choosing [facility] to provide your oncology and hematology care.   If you have a lab appointment with the Cancer Center, please go directly to the Cancer Center and check in at the registration area.  Wear comfortable clothing and clothing appropriate for easy access to any Portacath or PICC line.   We strive to give you quality time with your provider. You may need to reschedule your appointment if you arrive late (15 or more minutes).  Arriving late affects you and other patients whose appointments are after yours.  Also, if you miss three or more appointments without notifying the office, you may be dismissed from the clinic at the provider's discretion.      For prescription refill requests, have your pharmacy contact our office and allow 72 hours for refills to be completed.    Today you received the following chemotherapy and/or immunotherapy agents:  Cytoxan, Velcade and Faspro      To help prevent nausea and vomiting after your treatment, we encourage you to take your nausea medication as directed.  BELOW ARE SYMPTOMS THAT SHOULD BE REPORTED IMMEDIATELY: *FEVER GREATER THAN 100.4 F (38 C) OR HIGHER *CHILLS OR SWEATING *NAUSEA AND VOMITING THAT IS NOT CONTROLLED WITH YOUR NAUSEA MEDICATION *UNUSUAL SHORTNESS OF BREATH *UNUSUAL BRUISING OR BLEEDING *URINARY PROBLEMS (pain or burning when urinating, or frequent urination) *BOWEL PROBLEMS (unusual diarrhea, constipation, pain near the anus) TENDERNESS IN MOUTH AND THROAT WITH OR WITHOUT PRESENCE OF ULCERS (sore throat, sores in mouth, or a toothache) UNUSUAL RASH, SWELLING OR PAIN  UNUSUAL VAGINAL DISCHARGE OR ITCHING   Items with * indicate a potential emergency and should be followed up as soon as possible or go to the Emergency Department if any problems should occur.  Please show the CHEMOTHERAPY ALERT CARD  or IMMUNOTHERAPY ALERT CARD at check-in to the Emergency Department and triage nurse. Should you have questions after your visit or need to cancel or reschedule your appointment, please contact [facility] - A DEPT OF [facility]  [phone] and follow the prompts.  Office hours are 8:00 a.m. to 4:30 p.m. Monday - Friday. Please note that voicemails left after 4:00 p.m. may not be returned until the following business day.  We are closed weekends and major holidays. You have access to a nurse at all times for urgent questions. Please call the main number to the clinic [phone] and follow the prompts.  For any non-urgent questions, you may also contact your provider using MyChart. We now offer e-Visits for anyone 32 and older to request care online for non-urgent symptoms. For details visit [email or web address].   Also download the MyChart app! Go to the app store, search "MyChart", open the app, select [place], and log in with your MyChart username and password.  [facility]  Discharge Instructions: Thank you for choosing [facility] to provide your oncology and hematology care.   If you have a lab appointment with the Cancer Center, please go directly to the Cancer Center and check in at the registration area.  Wear comfortable clothing and clothing appropriate for easy access to any Portacath or PICC line.   We strive to give you quality time with your provider. You may need to reschedule your appointment if you arrive  late (15 or more minutes).  Arriving late affects you and other patients whose appointments are after yours.  Also, if you miss three or more appointments without notifying the office, you may be dismissed from the clinic at the provider's discretion.      For prescription refill requests, have your pharmacy contact our office and allow 72 hours for refills to be  completed.    Today you received the following chemotherapy and/or immunotherapy agents cytoxan, darzalex,velcade      To help prevent nausea and vomiting after your treatment, we encourage you to take your nausea medication as directed.  BELOW ARE SYMPTOMS THAT SHOULD BE REPORTED IMMEDIATELY: *FEVER GREATER THAN 100.4 F (38 C) OR HIGHER *CHILLS OR SWEATING *NAUSEA AND VOMITING THAT IS NOT CONTROLLED WITH YOUR NAUSEA MEDICATION *UNUSUAL SHORTNESS OF BREATH *UNUSUAL BRUISING OR BLEEDING *URINARY PROBLEMS (pain or burning when urinating, or frequent urination) *BOWEL PROBLEMS (unusual diarrhea, constipation, pain near the anus) TENDERNESS IN MOUTH AND THROAT WITH OR WITHOUT PRESENCE OF ULCERS (sore throat, sores in mouth, or a toothache) UNUSUAL RASH, SWELLING OR PAIN  UNUSUAL VAGINAL DISCHARGE OR ITCHING   Items with * indicate a potential emergency and should be followed up as soon as possible or go to the Emergency Department if any problems should occur.  Please show the CHEMOTHERAPY ALERT CARD or IMMUNOTHERAPY ALERT CARD at check-in to the Emergency Department and triage nurse. Should you have questions after your visit or need to cancel or reschedule your appointment, please contact [facility] - A DEPT OF [facility]  [phone] and follow the prompts.  Office hours are 8:00 a.m. to 4:30 p.m. Monday - Friday. Please note that voicemails left after 4:00 p.m. may not be returned until the following business day.  We are closed weekends and major holidays. You have access to a nurse at all times for urgent questions. Please call the main number to the clinic [phone] and follow the prompts.  For any non-urgent questions, you may also contact your provider using MyChart. We now offer e-Visits for anyone 69 and older to request care online for non-urgent symptoms. For details visit [email or web address].   Also download the MyChart app! Go to the app  store, search "MyChart", open the app, select [place], and log in with your MyChart username and password.

## 2023-04-14 NOTE — Patient Instructions (Signed)

## 2023-04-14 NOTE — Progress Notes (Signed)
Patient here to start treatment. Will get chemo education today.   Oncology Nurse Navigator Documentation     04/14/2023    9:45 AM  Oncology Nurse Navigator Flowsheets  Planned Course of Treatment Chemotherapy  Phase of Treatment Chemo  Chemotherapy Actual Start Date: 04/14/2023  Chemotherapy Expected End Date: 01/26/2024  Navigator Follow Up Date: 05/12/2023  Navigator Follow Up Reason: Follow-up Appointment;Chemotherapy  Navigator Location [facility]  Navigator Encounter Type Treatment  Treatment Initiated Date 04/14/2023  Patient Visit Type MedOnc  Treatment Phase First Chemo Tx  Barriers/Navigation Needs Coordination of Care;Education

## 2023-04-14 NOTE — Progress Notes (Signed)
1455-Pt without complaints and discharged to home in w/c with assist of pt.'s son.

## 2023-04-15 ENCOUNTER — Encounter (HOSPITAL_COMMUNITY)
Admission: RE | Admit: 2023-04-15 | Discharge: 2023-04-15 | Disposition: A | Discharge status: Home / Self Care | Payer: Medicare PPO | Source: Ambulatory Visit | Attending: Hematology & Oncology | Admitting: Hematology & Oncology

## 2023-04-15 DIAGNOSIS — C9000 Multiple myeloma not having achieved remission: Secondary | ICD-10-CM

## 2023-04-15 MED ORDER — FLUDEOXYGLUCOSE F - 18 (FDG) INJECTION
8.1000 | Freq: Once | INTRAVENOUS | Status: AC
  Administered 2023-04-15: 8.04 via INTRAVENOUS

## 2023-04-18 LAB — IMMUNOFIXATION REFLEX, SERUM
IgA: 62 mg/dL — ABNORMAL LOW (ref 64–422)
IgG (Immunoglobin G), Serum: 9183 mg/dL — ABNORMAL HIGH (ref 586–1602)
IgM (Immunoglobulin M), Srm: 37 mg/dL (ref 26–217)

## 2023-04-18 LAB — PROTEIN ELECTROPHORESIS, SERUM, WITH REFLEX
A/G Ratio: 0.5 — ABNORMAL LOW (ref 0.7–1.7)
Albumin ELP: 3.9 g/dL (ref 2.9–4.4)
Alpha-1-Globulin: 0.4 g/dL (ref 0.0–0.4)
Alpha-2-Globulin: 0.9 g/dL (ref 0.4–1.0)
Beta Globulin: 1.2 g/dL (ref 0.7–1.3)
Gamma Globulin: 5.2 g/dL — ABNORMAL HIGH (ref 0.4–1.8)
Globulin, Total: 7.8 g/dL — ABNORMAL HIGH (ref 2.2–3.9)
M-Spike, %: 4.6 g/dL — ABNORMAL HIGH
SPEP Interpretation: 0
Total Protein ELP: 11.7 g/dL — ABNORMAL HIGH (ref 6.0–8.5)

## 2023-04-19 NOTE — Progress Notes (Incomplete)
Chief Complaint: Patient was seen in consultation today for No chief complaint on file.  at the request of Ennever,Peter R  Referring Physician(s): Ennever,Peter R  History of Present Illness: Kelly Chase is a 78 y.o. 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   Multiple myeloma (HCC) 04/06/2023    Past Surgical History:  Procedure Laterality Date   ABDOMINAL HYSTERECTOMY     BREAST CYST EXCISION Right    BREAST SURGERY     cyst removed   CATARACT EXTRACTION W/ INTRAOCULAR LENS IMPLANT Left    EYE SURGERY     IR IMAGING GUIDED PORT INSERTION  04/12/2023   KNEE ARTHROSCOPY     left knee   LUMBAR LAMINECTOMY/DECOMPRESSION MICRODISCECTOMY Right 12/29/2017   Procedure: Right Lumbar Four-Five Laminectomy for facet/synovial cyst;  Surgeon: Coletta Memos, MD;  Location: [facility] OR;  Service: Neurosurgery;  Laterality: Right;  Right Lumbar Four-Five Laminectomy for facet/synovial cyst    Allergies: Celecoxib, Esomeprazole, Feldene [piroxicam], and Chlorthalidone  Medications: Prior to Admission medications   Medication Sig Start Date End Date Taking? Authorizing Provider  acetaminophen (TYLENOL) 500 MG tablet Take 1,000 mg by mouth every 6 (six) hours as needed.    [provider]  acyclovir (ZOVIRAX) 400 MG tablet Take 1 tablet (400 mg total) by mouth 2 (two) times daily. 04/14/23   Josph Macho, MD  amLODipine (NORVASC) 10 MG tablet TAKE 1 TABLET(10 MG) BY MOUTH DAILY 07/12/19   Mliss Sax, MD  cyclobenzaprine (FLEXERIL) 5 MG tablet Take 1 tablet (5 mg total) by mouth every 8 (eight) hours as needed for muscle spasms. 04/08/23   Josph Macho, MD  dexamethasone (DECADRON) 4 MG tablet Take 40 mg by mouth. Take 3 days. 04/04/23   [provider]  gabapentin (NEURONTIN) 100 MG capsule Take by mouth at  bedtime as needed. 04/01/23   [provider]  lisinopril (PRINIVIL,ZESTRIL) 20 MG tablet Take 1 tablet (20 mg total) by mouth daily. 04/21/18   Mliss Sax, MD  ondansetron (ZOFRAN) 8 MG tablet Take 8 mg by mouth 30 to 60 min prior to Cyclophosphamide administration then take 8 mg every 8 hrs as needed for nausea and vomiting. 04/14/23   Josph Macho, MD  prochlorperazine (COMPAZINE) 10 MG tablet Take 1 tablet (10 mg total) by mouth every 6 (six) hours as needed for nausea or vomiting. 04/14/23   Josph Macho, MD     No family history on file.  Social History   Socioeconomic History   Marital status: Divorced    Spouse name: Not on file   Number of children: Not on file   Years of education: Not on file   Highest education level: Not on file  Occupational History   Not on file  Tobacco Use   Smoking status: Never   Smokeless tobacco: Never   Tobacco comments:    NEVER USED TOBACCO  Vaping Use   Vaping status: Never Used  Substance and Sexual Activity   Alcohol use: No    Alcohol/week: 0.0 standard drinks of alcohol   Drug use: No   Sexual activity: Not Currently  Other Topics Concern   Not on file  Social History Narrative   Not on file   Social Drivers of Health   Financial Resource Strain: Low  Risk  (04/06/2023)   Overall Financial Resource Strain (CARDIA)    Difficulty of Paying Living Expenses: Not hard at all  Food Insecurity: Low Risk  (03/30/2023)   Received from [facility]   Hunger Vital Sign    Worried About Running Out of Food in the Last Year: Never true    Ran Out of Food in the Last Year: Never true  Transportation Needs: No Transportation Needs (03/30/2023)   Received from [employer]    In the past 12 months, has lack of reliable transportation kept you from medical appointments, meetings, work or from getting things needed for daily living? : No  Physical Activity: Inactive (04/06/2023)   Exercise Vital Sign     Days of Exercise per Week: 0 days    Minutes of Exercise per Session: 0 min  Stress: No Stress Concern Present (04/06/2023)   [employer] of Occupational Health - Occupational Stress Questionnaire    Feeling of Stress : Not at all  Social Connections: Moderately Integrated (04/06/2023)   Social Connection and Isolation Panel [NHANES]    Frequency of Communication with Friends and Family: More than three times a week    Frequency of Social Gatherings with Friends and Family: Twice a week    Attends Religious Services: More than 4 times per year    Active Member of [employer] or Organizations: No    Attends Engineer, structural: More than 4 times per year    Marital Status: Divorced    ECOG Status: {CHL ONC ECOG UJ:8119147829}  Review of Systems: A 12 point ROS discussed and pertinent positives are indicated in the HPI above.  All other systems are negative.  Review of Systems  Vital Signs: There were no vitals taken for this visit.  Physical Exam  Mallampati Score:     Imaging: IR IMAGING GUIDED PORT INSERTION Result Date: 04/12/2023 INDICATION: 78 year old female with history of plasma cell dyscrasia acquired central venous access for chemotherapy administration. EXAM: IMPLANTED PORT A CATH PLACEMENT WITH ULTRASOUND AND FLUOROSCOPIC GUIDANCE COMPARISON:  None Available. MEDICATIONS: None. ANESTHESIA/SEDATION: Moderate (conscious) sedation was employed during this procedure. A total of Versed 1 mg and Fentanyl 25 mcg was administered intravenously. Moderate Sedation Time: 12 minutes. The patient's level of consciousness and vital signs were monitored continuously by radiology nursing throughout the procedure under my direct supervision. CONTRAST:  None FLUOROSCOPY TIME:  0.1 mGy COMPLICATIONS: None immediate. PROCEDURE: The procedure, risks, benefits, and alternatives were explained to the patient. Questions regarding the procedure were encouraged and answered. The patient  understands and consents to the procedure. The right neck and chest were prepped with chlorhexidine in a sterile fashion, and a sterile drape was applied covering the operative field. Maximum barrier sterile technique with sterile gowns and gloves were used for the procedure. A timeout was performed prior to the initiation of the procedure. Ultrasound was used to examine the jugular vein which was compressible and free of internal echoes. A skin marker was used to demarcate the planned venotomy and port pocket incision sites. Local anesthesia was provided to these sites and the subcutaneous tunnel track with 1% lidocaine with 1:100,000 epinephrine. A small incision was created at the jugular access site and blunt dissection was performed of the subcutaneous tissues. Under ultrasound guidance, the jugular vein was accessed with a 21 ga micropuncture needle and an 0.018" wire was inserted to the superior vena cava. Real-time ultrasound guidance was utilized for vascular access including the acquisition of  a permanent ultrasound image documenting patency of the accessed vessel. A 5 Fr micopuncture set was then used, through which a 0.035" Rosen wire was passed under fluoroscopic guidance into the inferior vena cava. An 8 Fr dilator was then placed over the wire. A subcutaneous port pocket was then created along the upper chest wall utilizing a combination of sharp and blunt dissection. The pocket was irrigated with sterile saline, packed with gauze, and observed for hemorrhage. A single lumen clear view power injectable port was chosen for placement. The 8 Fr catheter was tunneled from the port pocket site to the venotomy incision. The port was placed in the pocket. The external catheter was trimmed to appropriate length. The dilator was exchanged for an 8 Fr peel-away sheath under fluoroscopic guidance. The catheter was then placed through the sheath and the sheath was removed. Final catheter positioning was confirmed  and documented with a fluoroscopic spot radiograph. The port was accessed with a Huber needle, aspirated, and flushed with heparinized saline. The deep dermal layer of the port pocket incision was closed with interrupted 3-0 Vicryl suture. Dermabond was then placed over the port pocket and neck incisions. The patient tolerated the procedure well without immediate post procedural complication. FINDINGS: After catheter placement, the tip lies within the superior cavoatrial junction. The catheter aspirates and flushes normally and is ready for immediate use. IMPRESSION: Successful placement of a power injectable Port-A-Cath via the right internal jugular vein. The catheter is ready for immediate use. Marliss Coots, MD [facility] Radiology Electronically Signed   By: Marliss Coots M.D.   On: 04/12/2023 10:21    Labs:  CBC: Recent Labs    04/06/23 1117 04/14/23 0950  WBC 9.8 7.8  HGB 8.4* 8.2*  HCT 26.2* 25.5*  PLT 229 298    COAGS: No results for input(s): "INR", "APTT" in the last 8760 hours.  BMP: Recent Labs    04/06/23 1117 04/14/23 0950  NA 127* 131*  K 4.0 3.9  CL 103 108  CO2 21* 20*  GLUCOSE 122* 82  BUN 49* 15  CALCIUM 8.1* 7.4*  CREATININE 1.91* 1.35*  GFRNONAA 27* 40*    LIVER FUNCTION TESTS: Recent Labs    04/06/23 1117 04/14/23 0950  BILITOT 0.4 0.6  AST 28 19  ALT 87* 31  ALKPHOS 53 58  PROT 11.8* 11.2*  ALBUMIN 3.4* 3.2*    TUMOR MARKERS: No results for input(s): "AFPTM", "CEA", "CA199", "CHROMGRNA" in the last 8760 hours.  Assessment and Plan:  ***  Thank you for this interesting consult.  I greatly enjoyed meeting JEVAEH SHAMS and look forward to participating in their care.  A copy of this report was sent to the requesting provider on this date.  Electronically Signed:  Roanna Banning, MD [facility] Radiology   Pager. [phone] Clinic.  [phone]  I spent a total of {New INPT:304952001} {New Out-Pt:304952002}  {Established Out-Pt:304952003} in face to face in clinical consultation, greater than 50% of which was counseling/coordinating care for ***

## 2023-04-20 ENCOUNTER — Inpatient Hospital Stay: Payer: Medicare PPO | Admitting: Dietician

## 2023-04-20 ENCOUNTER — Other Ambulatory Visit: Payer: Self-pay

## 2023-04-20 ENCOUNTER — Ambulatory Visit: Payer: Medicare PPO

## 2023-04-20 NOTE — Progress Notes (Signed)
Nutrition Assessment: Reached out to patient at home telephone number there was no answer, tried mobile and she picked up.    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 Gustavo Lah team but just returned to care after 2 years.  Treatment plan is for Faspro/Velcade/Cytoxan/prednisone. And she started with first infusion last week.  Patient denies and NIS at this time.  She does report having pain that prevents her from much food preparation or standing for extended periods.  She has supportive family who help with bring foods and grocery shopping.  Usual intake:  Cheerios Cereal with banana, or oatmeal with Lactaid Dinners: [employer], yesterday had Mindi Slicker Whopper that niece brought her (lives right beneath her)  Fluids: 3-4 bottles of water, yesterday had little orange juice   Nutrition Focused Physical Exam: unable to perform NFPE   Medications: pain meds and anti emetics ordered, no supplements or ONS at this time   Labs: 04/14/23  Na 131, GFR 40, Creat 1.35, Albumin 3.2, Ca 7.4, Hgb 8.2   Anthropometrics: Weight loss of 20# last year unintentional and she didn't notice any change in portions   Height: 66" Weight: 162# UBW: 184-190# BMI: 26.15   Estimated Energy Needs  Kcals: 1900-2300 Protein: 59-74 g Fluid: 2 L   NUTRITION DIAGNOSIS: Food and Nutrition Related Knowledge Deficit related to cancer and associated treatments as evidenced by no prior need for nutrition related information.    INTERVENTION:   Relayed that nutrition services are wrap around service provided at no charge and encouraged continued communication if experiencing any nutritional impact symptoms (NIS). Educated on importance of adequate nourishment with calorie and protein energy intake with nutrient dense foods when possible to maintain  weight/strength and QOL.    Encouraged soft moist high protein foods as well as small frequent meals/snacks.  Discussed strategies for increasing nutrients with prepackaged fruit cups, puddings, yogurts, HB eggs, fruit and vegetable juices  Emailed High Protein Snacking with contact information provided.  MONITORING, EVALUATION, GOAL: weight trends, nutrition impact symptoms, PO intake, labs  Goal is weight maintenance  Next Visit: Remote during infusion next month  Gennaro Africa, RDN, LDN Registered Dietitian, [facility] Part Time Remote (Usual office hours: Tuesday-Thursday) Mobile: [phone]

## 2023-04-21 ENCOUNTER — Inpatient Hospital Stay: Payer: Medicare PPO

## 2023-04-21 ENCOUNTER — Other Ambulatory Visit: Payer: Self-pay | Admitting: *Deleted

## 2023-04-21 ENCOUNTER — Encounter: Payer: Self-pay | Admitting: *Deleted

## 2023-04-21 ENCOUNTER — Other Ambulatory Visit: Payer: Self-pay

## 2023-04-21 VITALS — BP 127/71 | HR 98

## 2023-04-21 DIAGNOSIS — M25511 Pain in right shoulder: Secondary | ICD-10-CM

## 2023-04-21 DIAGNOSIS — Z5112 Encounter for antineoplastic immunotherapy: Secondary | ICD-10-CM | POA: Diagnosis not present

## 2023-04-21 DIAGNOSIS — C9000 Multiple myeloma not having achieved remission: Secondary | ICD-10-CM

## 2023-04-21 DIAGNOSIS — D472 Monoclonal gammopathy: Secondary | ICD-10-CM

## 2023-04-21 LAB — CMP (CANCER CENTER ONLY)
ALT: 22 U/L (ref 0–44)
AST: 17 U/L (ref 15–41)
Albumin: 3.3 g/dL — ABNORMAL LOW (ref 3.5–5.0)
Alkaline Phosphatase: 57 U/L (ref 38–126)
Anion gap: 3 — ABNORMAL LOW (ref 5–15)
BUN: 24 mg/dL — ABNORMAL HIGH (ref 8–23)
CO2: 23 mmol/L (ref 22–32)
Calcium: 8.8 mg/dL — ABNORMAL LOW (ref 8.9–10.3)
Chloride: 106 mmol/L (ref 98–111)
Creatinine: 1.17 mg/dL — ABNORMAL HIGH (ref 0.44–1.00)
GFR, Estimated: 48 mL/min — ABNORMAL LOW (ref >60)
Glucose, Bld: 87 mg/dL (ref 70–99)
Potassium: 4.1 mmol/L (ref 3.5–5.1)
Sodium: 132 mmol/L — ABNORMAL LOW (ref 135–145)
Total Bilirubin: 0.6 mg/dL (ref 0.0–1.2)
Total Protein: 10.4 g/dL — ABNORMAL HIGH (ref 6.5–8.1)

## 2023-04-21 LAB — CBC WITH DIFFERENTIAL (CANCER CENTER ONLY)
Abs Immature Granulocytes: 0.03 10*3/uL (ref 0.00–0.07)
Basophils Absolute: 0 10*3/uL (ref 0.0–0.1)
Basophils Relative: 0 %
Eosinophils Absolute: 0.1 10*3/uL (ref 0.0–0.5)
Eosinophils Relative: 1 %
HCT: 22 % — ABNORMAL LOW (ref 36.0–46.0)
Hemoglobin: 7.2 g/dL — ABNORMAL LOW (ref 12.0–15.0)
Immature Granulocytes: 1 %
Lymphocytes Relative: 25 %
Lymphs Abs: 0.9 10*3/uL (ref 0.7–4.0)
MCH: 28.9 pg (ref 26.0–34.0)
MCHC: 32.7 g/dL (ref 30.0–36.0)
MCV: 88.4 fL (ref 80.0–100.0)
Monocytes Absolute: 0.1 10*3/uL (ref 0.1–1.0)
Monocytes Relative: 2 %
Neutro Abs: 2.5 10*3/uL (ref 1.7–7.7)
Neutrophils Relative %: 71 %
Platelet Count: 214 10*3/uL (ref 150–400)
RBC: 2.49 MIL/uL — ABNORMAL LOW (ref 3.87–5.11)
RDW: 14.9 % (ref 11.5–15.5)
WBC Count: 3.6 10*3/uL — ABNORMAL LOW (ref 4.0–10.5)
nRBC: 0 % (ref 0.0–0.2)

## 2023-04-21 LAB — UPEP/UIFE/LIGHT CHAINS/TP, 24-HR UR
% BETA, Urine: 6.2 %
ALPHA 1 URINE: 0.7 %
Albumin, U: 7 %
Alpha 2, Urine: 2.8 %
Free Kappa Lt Chains,Ur: 5419.72 mg/L — ABNORMAL HIGH (ref 1.17–86.46)
Free Kappa/Lambda Ratio: 959.24 — ABNORMAL HIGH (ref 1.83–14.26)
Free Lambda Lt Chains,Ur: 5.65 mg/L (ref 0.27–15.21)
GAMMA GLOBULIN URINE: 83.4 %
M-SPIKE %, Urine: 76.8 % — ABNORMAL HIGH
M-Spike, Mg/24 Hr: 2102 mg/(24.h) — ABNORMAL HIGH
Total Protein, Urine-Ur/day: 2737 mg/(24.h) — ABNORMAL HIGH (ref 30–150)
Total Protein, Urine: 156.4 mg/dL
Total Volume: 1750

## 2023-04-21 LAB — ABO/RH: ABO/RH(D): A POS

## 2023-04-21 LAB — PREPARE RBC (CROSSMATCH)

## 2023-04-21 MED ORDER — DARATUMUMAB-HYALURONIDASE-FIHJ 1800-30000 MG-UT/15ML ~~LOC~~ SOLN
1800.0000 mg | Freq: Once | SUBCUTANEOUS | Status: AC
  Administered 2023-04-21: 1800 mg via SUBCUTANEOUS
  Filled 2023-04-21: qty 15

## 2023-04-21 MED ORDER — BORTEZOMIB CHEMO SQ INJECTION 3.5 MG (2.5MG/ML)
1.5000 mg/m2 | Freq: Once | INTRAMUSCULAR | Status: AC
  Administered 2023-04-21: 2.75 mg via SUBCUTANEOUS
  Filled 2023-04-21: qty 1.1

## 2023-04-21 MED ORDER — SODIUM CHLORIDE 0.9 % IV SOLN: INTRAVENOUS | Status: DC

## 2023-04-21 MED ORDER — DEXAMETHASONE 4 MG PO TABS
20.0000 mg | ORAL_TABLET | Freq: Once | ORAL | Status: AC
  Administered 2023-04-21: 20 mg via ORAL
  Filled 2023-04-21: qty 5

## 2023-04-21 MED ORDER — DIPHENHYDRAMINE HCL 25 MG PO CAPS
50.0000 mg | ORAL_CAPSULE | Freq: Once | ORAL | Status: AC
Start: 2023-04-21 — End: 2023-04-21
  Administered 2023-04-21: 50 mg via ORAL
  Filled 2023-04-21: qty 2

## 2023-04-21 MED ORDER — SODIUM CHLORIDE 0.9 % IV SOLN
300.0000 mg/m2 | Freq: Once | INTRAVENOUS | Status: AC
  Administered 2023-04-21: 560 mg via INTRAVENOUS
  Filled 2023-04-21: qty 28

## 2023-04-21 MED ORDER — TRAZODONE HCL 50 MG PO TABS: 50.0000 mg | ORAL_TABLET | Freq: Every day | ORAL | 0 refills | Status: DC

## 2023-04-21 MED ORDER — MONTELUKAST SODIUM 10 MG PO TABS
10.0000 mg | ORAL_TABLET | Freq: Once | ORAL | Status: AC
  Administered 2023-04-21: 10 mg via ORAL
  Filled 2023-04-21: qty 1

## 2023-04-21 MED ORDER — KETOROLAC TROMETHAMINE 15 MG/ML IJ SOLN
30.0000 mg | Freq: Once | INTRAMUSCULAR | Status: AC
Start: 2023-04-21 — End: 2023-04-21
  Administered 2023-04-21: 30 mg via INTRAVENOUS

## 2023-04-21 MED ORDER — HEPARIN SOD (PORK) LOCK FLUSH 100 UNIT/ML IV SOLN
500.0000 [IU] | Freq: Once | INTRAVENOUS | Status: AC
  Administered 2023-04-21: 500 [IU] via INTRAVENOUS

## 2023-04-21 MED ORDER — ACETAMINOPHEN 325 MG PO TABS
650.0000 mg | ORAL_TABLET | Freq: Once | ORAL | Status: AC
  Administered 2023-04-21: 650 mg via ORAL
  Filled 2023-04-21: qty 2

## 2023-04-21 MED ORDER — SODIUM CHLORIDE 0.9% FLUSH
10.0000 mL | INTRAVENOUS | Status: DC | PRN
  Administered 2023-04-21: 10 mL via INTRAVENOUS

## 2023-04-21 NOTE — Progress Notes (Signed)
OK to treat with HGB-7.2 per order of Dr. Myna Hidalgo.  Pt to receive 2 units of PRBC's tomorrow per order of Dr. Myna Hidalgo.

## 2023-04-21 NOTE — Progress Notes (Signed)
Received notice that patient did not attend her appointment yesterday to discuss vertebroplasty.   Spoke to patient in the treatment room. She didn't know what the appointment was for. She states that she has had so many scans and appointments that she just wants to wait until she is done with treatment before she has anymore. She states she is in pain and not sleeping. She is overwhelmed and wants to just get treatment until she starts to feel better.   We discussed that the purpose of the vertebroplasty is for pain control. The pain she describes can possibly be attributed to her known back defects. After this clarification she is interested in going through with the appointment, but states that she is so tired because she isn't sleeping at all. She needs to get some sleep so that she can be functional enough to go to appointments.  Message sent to Dr Myna Hidalgo. He will send in medication to help patient with sleep.   Oncology Nurse Navigator Documentation     04/21/2023    9:00 AM  Oncology Nurse Navigator Flowsheets  Navigator Follow Up Date: 05/12/2023  Navigator Follow Up Reason: Follow-up Appointment;Chemotherapy  Navigator Radiographer, therapeutic Encounter Type Treatment  Patient Visit Type MedOnc  Treatment Phase Active Tx  Barriers/Navigation Needs Coordination of Care;Education  Education Other  Interventions Education;Psycho-Social Support  Acuity Level 2-Minimal Needs (1-2 Barriers Identified)  Education Method Verbal  Support Groups/Services Friends and Family  Time Spent with Patient 15

## 2023-04-21 NOTE — Progress Notes (Signed)
[facility] Clinical Social Work  Initial Assessment   Kelly Chase is a 78 y.o. year old female contacted by phone. Clinical Social Work was referred by nurse navigator for assessment of psychosocial needs.   SDOH (Social Determinants of Health) assessments performed: Yes SDOH Interventions    Flowsheet Row Office Visit from 04/06/2023 in [facility] [place]  SDOH Interventions   Financial Strain Interventions Intervention Not Indicated  Physical Activity Interventions Intervention Not Indicated  Stress Interventions Intervention Not Indicated  Social Connections Interventions Intervention Not Indicated  Health Literacy Interventions Intervention Not Indicated       SDOH Screenings   Food Insecurity: Low Risk  (03/30/2023)   Received from [facility]  Housing: Low Risk  (03/30/2023)   Received from [facility]  Transportation Needs: No Transportation Needs (03/30/2023)   Received from [facility]  Utilities: Low Risk  (03/30/2023)   Received from [facility]  Alcohol Screen: Low Risk  (04/06/2023)  Depression (PHQ2-9): Low Risk  (04/06/2023)  Financial Resource Strain: Low Risk  (04/06/2023)  Physical Activity: Inactive (04/06/2023)  Social Connections: Moderately Integrated (04/06/2023)  Stress: No Stress Concern Present (04/06/2023)  Tobacco Use: Low Risk  (04/06/2023)  Health Literacy: Adequate Health Literacy (04/06/2023)     Distress Screen completed: No     No data to display            Family/Social Information:  Housing Arrangement: patient lives alone Family members/support persons in your life? Family.  Patient's son lives nearby and is very supportive. 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 yes Concerns about diagnosis and/or treatment: I'm not especially worried about anything Patient reported stressors:  Patient denied. Hopes and/or priorities: Son Patient enjoys time with family/ friends Current coping skills/ strengths: Manufacturing systems engineer , Contractor , General fund of knowledge , and Supportive family/friends     SUMMARY: Current SDOH Barriers:  None per patient.  Clinical Social Work Clinical Goal(s):  No clinical social work goals at this time  Interventions: Discussed common feeling and emotions when being diagnosed with cancer, and the importance of support during treatment Informed patient of the support team roles and support services at [facility] Provided CSW contact information and encouraged patient to call with any questions or concerns Provided patient with information about CSW role and the [employer].  Patient stated she had no current needs but has CSW contact information if needed in the future.   Follow Up Plan: Patient will contact CSW with any support or resource needs Patient verbalizes understanding of plan: Yes    Dorothey Baseman, LCSW Clinical Social Worker [facility]

## 2023-04-21 NOTE — Addendum Note (Signed)
Addended by: Margaretha Seeds on: 04/21/2023 02:04 PM   Modules accepted: Orders

## 2023-04-22 ENCOUNTER — Inpatient Hospital Stay: Payer: Medicare PPO

## 2023-04-22 DIAGNOSIS — C9000 Multiple myeloma not having achieved remission: Secondary | ICD-10-CM

## 2023-04-22 DIAGNOSIS — Z5112 Encounter for antineoplastic immunotherapy: Secondary | ICD-10-CM | POA: Diagnosis not present

## 2023-04-22 LAB — PRETREATMENT RBC PHENOTYPE

## 2023-04-22 MED ORDER — DIPHENHYDRAMINE HCL 25 MG PO CAPS
25.0000 mg | ORAL_CAPSULE | Freq: Once | ORAL | Status: AC
  Administered 2023-04-22: 25 mg via ORAL
  Filled 2023-04-22: qty 1

## 2023-04-22 MED ORDER — SODIUM CHLORIDE 0.9% FLUSH
10.0000 mL | INTRAVENOUS | Status: DC | PRN
  Administered 2023-04-22: 10 mL via INTRAVENOUS

## 2023-04-22 MED ORDER — HEPARIN SOD (PORK) LOCK FLUSH 100 UNIT/ML IV SOLN
500.0000 [IU] | Freq: Once | INTRAVENOUS | Status: AC
  Administered 2023-04-22: 500 [IU] via INTRAVENOUS

## 2023-04-22 MED ORDER — ACETAMINOPHEN 325 MG PO TABS
650.0000 mg | ORAL_TABLET | Freq: Once | ORAL | Status: AC
  Administered 2023-04-22: 650 mg via ORAL
  Filled 2023-04-22: qty 2

## 2023-04-22 MED ORDER — SODIUM CHLORIDE 0.9% IV SOLUTION
250.0000 mL | INTRAVENOUS | Status: DC
  Administered 2023-04-22: 250 mL via INTRAVENOUS

## 2023-04-22 NOTE — Patient Instructions (Signed)

## 2023-04-25 LAB — BPAM RBC
Blood Product Expiration Date: 202502072359
Blood Product Expiration Date: 202502182359
ISSUE DATE / TIME: 202501310812
ISSUE DATE / TIME: 202501310812
Unit Type and Rh: 6200
Unit Type and Rh: 6200

## 2023-04-25 LAB — TYPE AND SCREEN
ABO/RH(D): A POS
Antibody Screen: POSITIVE
DAT, IgG: NEGATIVE
Unit division: 0
Unit division: 0

## 2023-04-26 ENCOUNTER — Encounter: Payer: Self-pay | Admitting: *Deleted

## 2023-04-26 NOTE — Progress Notes (Signed)
 Reviewed baseline PET which shows diffuse skeletal activity.   Oncology Nurse Navigator Documentation     04/26/2023    8:30 AM  Oncology Nurse Navigator Flowsheets  Navigator Follow Up Date: 05/12/2023  Navigator Follow Up Reason: Follow-up Appointment;Chemotherapy  Navigator Location [facility]  Navigator Encounter Type Scan Review  Patient Visit Type MedOnc  Treatment Phase Active Tx  Barriers/Navigation Needs Coordination of Care;Education  Interventions None Required  Acuity Level 2-Minimal Needs (1-2 Barriers Identified)  Support Groups/Services Friends and Family  Time Spent with Patient 15

## 2023-04-28 ENCOUNTER — Inpatient Hospital Stay: Payer: Medicare PPO

## 2023-04-28 ENCOUNTER — Inpatient Hospital Stay: Payer: Medicare PPO | Attending: Hematology & Oncology

## 2023-04-28 ENCOUNTER — Telehealth: Payer: Self-pay | Admitting: *Deleted

## 2023-04-28 VITALS — BP 129/74 | HR 95 | Temp 97.9°F | Resp 17

## 2023-04-28 DIAGNOSIS — C9000 Multiple myeloma not having achieved remission: Secondary | ICD-10-CM | POA: Diagnosis present

## 2023-04-28 DIAGNOSIS — Z5111 Encounter for antineoplastic chemotherapy: Secondary | ICD-10-CM | POA: Diagnosis present

## 2023-04-28 DIAGNOSIS — Z5112 Encounter for antineoplastic immunotherapy: Secondary | ICD-10-CM | POA: Insufficient documentation

## 2023-04-28 LAB — CMP (CANCER CENTER ONLY)
ALT: 85 U/L — ABNORMAL HIGH (ref 0–44)
AST: 38 U/L (ref 15–41)
Albumin: 3.4 g/dL — ABNORMAL LOW (ref 3.5–5.0)
Alkaline Phosphatase: 107 U/L (ref 38–126)
Anion gap: 3 — ABNORMAL LOW (ref 5–15)
BUN: 20 mg/dL (ref 8–23)
CO2: 24 mmol/L (ref 22–32)
Calcium: 9.1 mg/dL (ref 8.9–10.3)
Chloride: 106 mmol/L (ref 98–111)
Creatinine: 1.01 mg/dL — ABNORMAL HIGH (ref 0.44–1.00)
GFR, Estimated: 57 mL/min — ABNORMAL LOW (ref >60)
Glucose, Bld: 102 mg/dL — ABNORMAL HIGH (ref 70–99)
Potassium: 3.7 mmol/L (ref 3.5–5.1)
Sodium: 133 mmol/L — ABNORMAL LOW (ref 135–145)
Total Bilirubin: 0.8 mg/dL (ref 0.0–1.2)
Total Protein: 8.9 g/dL — ABNORMAL HIGH (ref 6.5–8.1)

## 2023-04-28 LAB — CBC WITH DIFFERENTIAL (CANCER CENTER ONLY)
Abs Immature Granulocytes: 0 10*3/uL (ref 0.00–0.07)
Basophils Absolute: 0 10*3/uL (ref 0.0–0.1)
Basophils Relative: 0 %
Eosinophils Absolute: 0 10*3/uL (ref 0.0–0.5)
Eosinophils Relative: 1 %
HCT: 29.7 % — ABNORMAL LOW (ref 36.0–46.0)
Hemoglobin: 9.9 g/dL — ABNORMAL LOW (ref 12.0–15.0)
Immature Granulocytes: 0 %
Lymphocytes Relative: 39 %
Lymphs Abs: 0.6 10*3/uL — ABNORMAL LOW (ref 0.7–4.0)
MCH: 28.9 pg (ref 26.0–34.0)
MCHC: 33.3 g/dL (ref 30.0–36.0)
MCV: 86.8 fL (ref 80.0–100.0)
Monocytes Absolute: 0.1 10*3/uL (ref 0.1–1.0)
Monocytes Relative: 8 %
Neutro Abs: 0.9 10*3/uL — ABNORMAL LOW (ref 1.7–7.7)
Neutrophils Relative %: 52 %
Platelet Count: 209 10*3/uL (ref 150–400)
RBC: 3.42 MIL/uL — ABNORMAL LOW (ref 3.87–5.11)
RDW: 14.6 % (ref 11.5–15.5)
WBC Count: 1.6 10*3/uL — ABNORMAL LOW (ref 4.0–10.5)
nRBC: 0 % (ref 0.0–0.2)

## 2023-04-28 MED ORDER — BORTEZOMIB CHEMO SQ INJECTION 3.5 MG (2.5MG/ML)
1.5000 mg/m2 | Freq: Once | INTRAMUSCULAR | Status: AC
  Administered 2023-04-28: 2.75 mg via SUBCUTANEOUS
  Filled 2023-04-28: qty 1.1

## 2023-04-28 MED ORDER — ACETAMINOPHEN 325 MG PO TABS
650.0000 mg | ORAL_TABLET | Freq: Once | ORAL | Status: AC
Start: 2023-04-28 — End: 2023-04-28
  Administered 2023-04-28: 650 mg via ORAL
  Filled 2023-04-28: qty 2

## 2023-04-28 MED ORDER — DEXAMETHASONE 4 MG PO TABS
20.0000 mg | ORAL_TABLET | Freq: Once | ORAL | Status: AC
  Administered 2023-04-28: 20 mg via ORAL
  Filled 2023-04-28: qty 5

## 2023-04-28 MED ORDER — MONTELUKAST SODIUM 10 MG PO TABS
10.0000 mg | ORAL_TABLET | Freq: Once | ORAL | Status: AC
  Administered 2023-04-28: 10 mg via ORAL
  Filled 2023-04-28: qty 1

## 2023-04-28 MED ORDER — DIPHENHYDRAMINE HCL 25 MG PO CAPS
50.0000 mg | ORAL_CAPSULE | Freq: Once | ORAL | Status: AC
  Administered 2023-04-28: 50 mg via ORAL
  Filled 2023-04-28: qty 2

## 2023-04-28 MED ORDER — DARATUMUMAB-HYALURONIDASE-FIHJ 1800-30000 MG-UT/15ML ~~LOC~~ SOLN
1800.0000 mg | Freq: Once | SUBCUTANEOUS | Status: AC
Start: 2023-04-28 — End: 2023-04-28
  Administered 2023-04-28: 1800 mg via SUBCUTANEOUS
  Filled 2023-04-28: qty 15

## 2023-04-28 NOTE — Telephone Encounter (Signed)
 I called Kelly Chase to reschedule the Vetebroplasty consult, she "no showed". [name] states she does not want to schedule now she is having chemo and doing all she can handle./vm

## 2023-04-28 NOTE — Progress Notes (Signed)
 CBC and CMET reviewed by MD, Hold Cytoxan  due to counts. Proceed with treatment with Velcade  and Darzalex .

## 2023-04-28 NOTE — Patient Instructions (Signed)

## 2023-04-28 NOTE — Patient Instructions (Signed)
 Daratumumab Injection What is this medication? DARATUMUMAB (dar a toom ue mab) treats multiple myeloma, a type of bone marrow cancer. It works by helping your immune system slow or stop the spread of cancer cells. It is a monoclonal antibody. This medicine may be used for other purposes; ask your health care provider or pharmacist if you have questions. COMMON BRAND NAME(S): DARZALEX What should I tell my care team before I take this medication? They need to know if you have any of these conditions: Hereditary fructose intolerance Infection, such as chickenpox, herpes, hepatitis B Lung or breathing disease, such as asthma, COPD An unusual or allergic reaction to daratumumab, sorbitol, other medications, foods, dyes, or preservatives Pregnant or trying to get pregnant Breastfeeding How should I use this medication? This medication is injected into a vein. It is given by your care team in a hospital or clinic setting. Talk to your care team about the use of this medication in children. Special care may be needed. Overdosage: If you think you have taken too much of this medicine contact a poison control center or emergency room at once. NOTE: This medicine is only for you. Do not share this medicine with others. What if I miss a dose? Keep appointments for follow-up doses. It is important not to miss your dose. Call your care team if you are unable to keep an appointment. What may interact with this medication? Interactions have not been studied. This list may not describe all possible interactions. Give your health care provider a list of all the medicines, herbs, non-prescription drugs, or dietary supplements you use. Also tell them if you smoke, drink alcohol, or use illegal drugs. Some items may interact with your medicine. What should I watch for while using this medication? Your condition will be monitored carefully while you are receiving this medication. This medication can cause  serious allergic reactions. To reduce your risk, your care team may give you other medication to take before receiving this one. Be sure to follow the directions from your care team. This medication can affect the results of blood tests to match your blood type. These changes can last for up to 6 months after the final dose. Your care team will do blood tests to match your blood type before you start treatment. Tell all of your care team that you are being treated with this medication before receiving a blood transfusion. This medication can affect the results of some tests used to determine treatment response; extra tests may be needed to evaluate response. Talk to your care team if you wish to become pregnant or think you are pregnant. This medication can cause serious birth defects if taken during pregnancy and for 3 months after the last dose. A reliable form of contraception is recommended while taking this medication and for 3 months after the last dose. Talk to your care team about effective forms of contraception. Do not breast-feed while taking this medication. What side effects may I notice from receiving this medication? Side effects that you should report to your care team as soon as possible: Allergic reactions--skin rash, itching, hives, swelling of the face, lips, tongue, or throat Infection--fever, chills, cough, sore throat, wounds that don't heal, pain or trouble when passing urine, general feeling of discomfort or being unwell Infusion reactions--chest pain, shortness of breath or trouble breathing, feeling faint or lightheaded Unusual bruising or bleeding Side effects that usually do not require medical attention (report to your care team if they continue or  are bothersome): Constipation Diarrhea Fatigue Nausea Pain, tingling, or numbness in the hands or feet Swelling of the ankles, hands, or feet This list may not describe all possible side effects. Call your doctor for medical  advice about side effects. You may report side effects to FDA at 1-800-FDA-1088. Where should I keep my medication? This medication is given in a hospital or clinic. It will not be stored at home. NOTE: This sheet is a summary. It may not cover all possible information. If you have questions about this medicine, talk to your doctor, pharmacist, or health care provider.  2024 Elsevier/Gold Standard (2022-01-14 00:00:00)Bortezomib Injection What is this medication? BORTEZOMIB (bor TEZ oh mib) treats lymphoma. It may also be used to treat multiple myeloma, a type of bone marrow cancer. It works by blocking a protein that causes cancer cells to grow and multiply. This helps to slow or stop the spread of cancer cells. This medicine may be used for other purposes; ask your health care provider or pharmacist if you have questions. COMMON BRAND NAME(S): BORUZU, Velcade What should I tell my care team before I take this medication? They need to know if you have any of these conditions: Dehydration Diabetes Heart disease Liver disease Tingling of the fingers or toes or other nerve disorder An unusual or allergic reaction to bortezomib, other medications, foods, dyes, or preservatives If you or your partner are pregnant or trying to get pregnant Breastfeeding How should I use this medication? This medication is injected into a vein or under the skin. It is given by your care team in a hospital or clinic setting. Talk to your care team about the use of this medication in children. Special care may be needed. Overdosage: If you think you have taken too much of this medicine contact a poison control center or emergency room at once. NOTE: This medicine is only for you. Do not share this medicine with others. What if I miss a dose? Keep appointments for follow-up doses. It is important not to miss your dose. Call your care team if you are unable to keep an appointment. What may interact with this  medication? Ketoconazole Rifampin This list may not describe all possible interactions. Give your health care provider a list of all the medicines, herbs, non-prescription drugs, or dietary supplements you use. Also tell them if you smoke, drink alcohol, or use illegal drugs. Some items may interact with your medicine. What should I watch for while using this medication? Your condition will be monitored carefully while you are receiving this medication. You may need blood work while taking this medication. This medication may affect your coordination, reaction time, or judgment. Do not drive or operate machinery until you know how this medication affects you. Sit up or stand slowly to reduce the risk of dizzy or fainting spells. Drinking alcohol with this medication can increase the risk of these side effects. This medication may increase your risk of getting an infection. Call your care team for advice if you get a fever, chills, sore throat, or other symptoms of a cold or flu. Do not treat yourself. Try to avoid being around people who are sick. Check with your care team if you have severe diarrhea, nausea, and vomiting, or if you sweat a lot. The loss of too much body fluid may make it dangerous for you to take this medication. Talk to your care team if you may be pregnant. Serious birth defects can occur if you take this medication  during pregnancy and for 7 months after the last dose. You will need a negative pregnancy test before starting this medication. Contraception is recommended while taking this medication and for 7 months after the last dose. Your care team can help you find the option that works for you. If your partner can get pregnant, use a condom during sex while taking this medication and for 4 months after the last dose. Do not breastfeed while taking this medication and for 2 months after the last dose. This medication may cause infertility. Talk to your care team if you are  concerned about your fertility. What side effects may I notice from receiving this medication? Side effects that you should report to your care team as soon as possible: Allergic reactions--skin rash, itching, hives, swelling of the face, lips, tongue, or throat Bleeding--bloody or black, tar-like stools, vomiting blood or brown material that looks like coffee grounds, red or dark brown urine, small red or purple spots on skin, unusual bruising or bleeding Bleeding in the brain--severe headache, stiff neck, confusion, dizziness, change in vision, numbness or weakness of the face, arm, or leg, trouble speaking, trouble walking, vomiting Bowel blockage--stomach cramping, unable to have a bowel movement or pass gas, loss of appetite, vomiting Heart failure--shortness of breath, swelling of the ankles, feet, or hands, sudden weight gain, unusual weakness or fatigue Infection--fever, chills, cough, sore throat, wounds that don't heal, pain or trouble when passing urine, general feeling of discomfort or being unwell Liver injury--right upper belly pain, loss of appetite, nausea, light-colored stool, dark yellow or brown urine, yellowing skin or eyes, unusual weakness or fatigue Low blood pressure--dizziness, feeling faint or lightheaded, blurry vision Lung injury--shortness of breath or trouble breathing, cough, spitting up blood, chest pain, fever Pain, tingling, or numbness in the hands or feet Severe or prolonged diarrhea Stomach pain, bloody diarrhea, pale skin, unusual weakness or fatigue, decrease in the amount of urine, which may be signs of hemolytic uremic syndrome Sudden and severe headache, confusion, change in vision, seizures, which may be signs of posterior reversible encephalopathy syndrome (PRES) TTP--purple spots on the skin or inside the mouth, pale skin, yellowing skin or eyes, unusual weakness or fatigue, fever, fast or irregular heartbeat, confusion, change in vision, trouble speaking,  trouble walking Tumor lysis syndrome (TLS)--nausea, vomiting, diarrhea, decrease in the amount of urine, dark urine, unusual weakness or fatigue, confusion, muscle pain or cramps, fast or irregular heartbeat, joint pain Side effects that usually do not require medical attention (report to your care team if they continue or are bothersome): Constipation Diarrhea Fatigue Loss of appetite Nausea This list may not describe all possible side effects. Call your doctor for medical advice about side effects. You may report side effects to FDA at 1-800-FDA-1088. Where should I keep my medication? This medication is given in a hospital or clinic. It will not be stored at home. NOTE: This sheet is a summary. It may not cover all possible information. If you have questions about this medicine, talk to your doctor, pharmacist, or health care provider.  2024 Elsevier/Gold Standard (2021-08-11 00:00:00)

## 2023-05-10 ENCOUNTER — Encounter: Payer: Self-pay | Admitting: Hematology & Oncology

## 2023-05-12 ENCOUNTER — Inpatient Hospital Stay: Payer: Medicare PPO | Admitting: Medical Oncology

## 2023-05-12 ENCOUNTER — Inpatient Hospital Stay: Payer: Medicare PPO

## 2023-05-12 ENCOUNTER — Inpatient Hospital Stay: Payer: Medicare PPO | Admitting: Dietician

## 2023-05-12 NOTE — Progress Notes (Signed)
 Nutrition Follow Up: Reached out to patient at home telephone number there was no answer, tried mobile and she picked up.    Patient denies and NIS at this time.  She reports she feels like her appetite has improved a bit.  Little change to her PO intake other than increased her intake of fruit cups.  She doesn't care are yogurt but willing to add some hard boiled eggs and peanut butter and jelly sandwiches, pinto beans (prefers her homemade from dry)  Continues with Fluids: 3-4 bottles of water, yesterday had little orange juice   Medications: pain meds and anti emetics ordered, no supplements or ONS at this time  Labs: 04/28/23  Na 133, GFR 57, Creat 1.01, Albumin 3.4, Ca 9.1, Hgb 9.9 (improving other than WBC 1.6)   Anthropometrics: Weight loss of 20# last year unintentional no weight yet this month. Treatments canceled today for weather  Height: 66" Weight:  04/21/23  155# 04/12/23  162# UBW: 184-190# BMI: 25.15   Estimated Energy Needs  Kcals: 1900-2300 Protein: 59-74 g Fluid: 2 L   NUTRITION DIAGNOSIS: Food and Nutrition Related Knowledge Deficit related to cancer and associated treatments as evidenced by no prior need for nutrition related information.    INTERVENTION:   Discussed strategies for increasing nutrients with prepackaged fruit cups, puddings, HB eggs, fruit and vegetable juices.  Spicing up her canned pintos.  Encourage extra snack between (1/2 peanut butter and jelly sandwich or grilled cheese)  Encouraged continue attention to hydration   MONITORING, EVALUATION, GOAL: weight trends, nutrition impact symptoms, PO intake, labs  Goal is weight maintenance  Next Visit: Remote next month  Gennaro Africa, RDN, LDN Registered Dietitian, [facility] Part Time Remote (Usual office hours: Tuesday-Thursday) Mobile: [phone]

## 2023-05-13 ENCOUNTER — Other Ambulatory Visit: Payer: Self-pay | Admitting: *Deleted

## 2023-05-13 ENCOUNTER — Encounter: Payer: Self-pay | Admitting: Family

## 2023-05-13 ENCOUNTER — Inpatient Hospital Stay: Payer: Medicare PPO

## 2023-05-13 ENCOUNTER — Other Ambulatory Visit: Payer: Self-pay

## 2023-05-13 ENCOUNTER — Other Ambulatory Visit: Payer: Self-pay | Admitting: Family

## 2023-05-13 ENCOUNTER — Encounter: Payer: Self-pay | Admitting: *Deleted

## 2023-05-13 ENCOUNTER — Other Ambulatory Visit: Payer: Self-pay | Admitting: Hematology & Oncology

## 2023-05-13 ENCOUNTER — Inpatient Hospital Stay (HOSPITAL_BASED_OUTPATIENT_CLINIC_OR_DEPARTMENT_OTHER): Payer: Medicare PPO | Admitting: Family

## 2023-05-13 VITALS — BP 129/67 | HR 90 | Temp 98.2°F | Resp 17 | Wt 158.0 lb

## 2023-05-13 DIAGNOSIS — D472 Monoclonal gammopathy: Secondary | ICD-10-CM

## 2023-05-13 DIAGNOSIS — Z5112 Encounter for antineoplastic immunotherapy: Secondary | ICD-10-CM | POA: Diagnosis not present

## 2023-05-13 DIAGNOSIS — M25511 Pain in right shoulder: Secondary | ICD-10-CM

## 2023-05-13 DIAGNOSIS — C9000 Multiple myeloma not having achieved remission: Secondary | ICD-10-CM

## 2023-05-13 DIAGNOSIS — M899 Disorder of bone, unspecified: Secondary | ICD-10-CM | POA: Insufficient documentation

## 2023-05-13 LAB — CMP (CANCER CENTER ONLY)
ALT: 46 U/L — ABNORMAL HIGH (ref 0–44)
AST: 27 U/L (ref 15–41)
Albumin: 3.8 g/dL (ref 3.5–5.0)
Alkaline Phosphatase: 176 U/L — ABNORMAL HIGH (ref 38–126)
Anion gap: 6 (ref 5–15)
BUN: 17 mg/dL (ref 8–23)
CO2: 26 mmol/L (ref 22–32)
Calcium: 9.5 mg/dL (ref 8.9–10.3)
Chloride: 105 mmol/L (ref 98–111)
Creatinine: 0.83 mg/dL (ref 0.44–1.00)
GFR, Estimated: >60 mL/min (ref >60)
Glucose, Bld: 91 mg/dL (ref 70–99)
Potassium: 3.7 mmol/L (ref 3.5–5.1)
Sodium: 137 mmol/L (ref 135–145)
Total Bilirubin: 0.7 mg/dL (ref 0.0–1.2)
Total Protein: 7.4 g/dL (ref 6.5–8.1)

## 2023-05-13 LAB — CBC WITH DIFFERENTIAL (CANCER CENTER ONLY)
Abs Immature Granulocytes: 0.03 10*3/uL (ref 0.00–0.07)
Basophils Absolute: 0 10*3/uL (ref 0.0–0.1)
Basophils Relative: 0 %
Eosinophils Absolute: 0 10*3/uL (ref 0.0–0.5)
Eosinophils Relative: 0 %
HCT: 27.9 % — ABNORMAL LOW (ref 36.0–46.0)
Hemoglobin: 9.3 g/dL — ABNORMAL LOW (ref 12.0–15.0)
Immature Granulocytes: 1 %
Lymphocytes Relative: 28 %
Lymphs Abs: 1.8 10*3/uL (ref 0.7–4.0)
MCH: 29.2 pg (ref 26.0–34.0)
MCHC: 33.3 g/dL (ref 30.0–36.0)
MCV: 87.7 fL (ref 80.0–100.0)
Monocytes Absolute: 0.6 10*3/uL (ref 0.1–1.0)
Monocytes Relative: 9 %
Neutro Abs: 4.1 10*3/uL (ref 1.7–7.7)
Neutrophils Relative %: 62 %
Platelet Count: 355 10*3/uL (ref 150–400)
RBC: 3.18 MIL/uL — ABNORMAL LOW (ref 3.87–5.11)
RDW: 15.3 % (ref 11.5–15.5)
WBC Count: 6.6 10*3/uL (ref 4.0–10.5)
nRBC: 0 % (ref 0.0–0.2)

## 2023-05-13 LAB — LACTATE DEHYDROGENASE: LDH: 346 U/L — ABNORMAL HIGH (ref 98–192)

## 2023-05-13 MED ORDER — SODIUM CHLORIDE 0.9 % IV SOLN
300.0000 mg/m2 | Freq: Once | INTRAVENOUS | Status: AC
  Administered 2023-05-13: 560 mg via INTRAVENOUS
  Filled 2023-05-13: qty 28

## 2023-05-13 MED ORDER — ACETAMINOPHEN 325 MG PO TABS
650.0000 mg | ORAL_TABLET | Freq: Once | ORAL | Status: AC
  Administered 2023-05-13: 650 mg via ORAL
  Filled 2023-05-13: qty 2

## 2023-05-13 MED ORDER — DARATUMUMAB-HYALURONIDASE-FIHJ 1800-30000 MG-UT/15ML ~~LOC~~ SOLN
1800.0000 mg | Freq: Once | SUBCUTANEOUS | Status: AC
Start: 2023-05-13 — End: 2023-05-13
  Administered 2023-05-13: 1800 mg via SUBCUTANEOUS
  Filled 2023-05-13: qty 15

## 2023-05-13 MED ORDER — DIPHENHYDRAMINE HCL 25 MG PO CAPS
50.0000 mg | ORAL_CAPSULE | Freq: Once | ORAL | Status: AC
Start: 2023-05-13 — End: 2023-05-13
  Administered 2023-05-13: 50 mg via ORAL
  Filled 2023-05-13: qty 2

## 2023-05-13 MED ORDER — SODIUM CHLORIDE 0.9 % IV SOLN: INTRAVENOUS | Status: DC

## 2023-05-13 MED ORDER — BORTEZOMIB CHEMO SQ INJECTION 3.5 MG (2.5MG/ML)
1.5000 mg/m2 | Freq: Once | INTRAMUSCULAR | Status: AC
  Administered 2023-05-13: 2.75 mg via SUBCUTANEOUS
  Filled 2023-05-13: qty 1.1

## 2023-05-13 MED ORDER — DEXAMETHASONE 4 MG PO TABS
40.0000 mg | ORAL_TABLET | Freq: Once | ORAL | Status: AC
  Administered 2023-05-13: 40 mg via ORAL
  Filled 2023-05-13: qty 10

## 2023-05-13 NOTE — Patient Instructions (Signed)
 [facility] - A DEPT OF [facility]  Discharge Instructions: Thank you for choosing [facility] to provide your oncology and hematology care.   If you have a lab appointment with the Cancer Center, please go directly to the Cancer Center and check in at the registration area.  Wear comfortable clothing and clothing appropriate for easy access to any Portacath or PICC line.   We strive to give you quality time with your provider. You may need to reschedule your appointment if you arrive late (15 or more minutes).  Arriving late affects you and other patients whose appointments are after yours.  Also, if you miss three or more appointments without notifying the office, you may be dismissed from the clinic at the provider's discretion.      For prescription refill requests, have your pharmacy contact our office and allow 72 hours for refills to be completed.    Today you received the following chemotherapy and/or immunotherapy agents Cytoxan, Velcade, Darzalex      To help prevent nausea and vomiting after your treatment, we encourage you to take your nausea medication as directed.  BELOW ARE SYMPTOMS THAT SHOULD BE REPORTED IMMEDIATELY: *FEVER GREATER THAN 100.4 F (38 C) OR HIGHER *CHILLS OR SWEATING *NAUSEA AND VOMITING THAT IS NOT CONTROLLED WITH YOUR NAUSEA MEDICATION *UNUSUAL SHORTNESS OF BREATH *UNUSUAL BRUISING OR BLEEDING *URINARY PROBLEMS (pain or burning when urinating, or frequent urination) *BOWEL PROBLEMS (unusual diarrhea, constipation, pain near the anus) TENDERNESS IN MOUTH AND THROAT WITH OR WITHOUT PRESENCE OF ULCERS (sore throat, sores in mouth, or a toothache) UNUSUAL RASH, SWELLING OR PAIN  UNUSUAL VAGINAL DISCHARGE OR ITCHING   Items with * indicate a potential emergency and should be followed up as soon as possible or go to the Emergency Department if any problems should occur.  Please show the CHEMOTHERAPY ALERT CARD or  IMMUNOTHERAPY ALERT CARD at check-in to the Emergency Department and triage nurse. Should you have questions after your visit or need to cancel or reschedule your appointment, please contact [facility] - A DEPT OF [facility]  [phone] and follow the prompts.  Office hours are 8:00 a.m. to 4:30 p.m. Monday - Friday. Please note that voicemails left after 4:00 p.m. may not be returned until the following business day.  We are closed weekends and major holidays. You have access to a nurse at all times for urgent questions. Please call the main number to the clinic [phone] and follow the prompts.  For any non-urgent questions, you may also contact your provider using MyChart. We now offer e-Visits for anyone 67 and older to request care online for non-urgent symptoms. For details visit [email or web address].   Also download the MyChart app! Go to the app store, search "MyChart", open the app, select , and log in with your MyChart username and password.

## 2023-05-13 NOTE — Progress Notes (Signed)
 Patient is here to begin cycle two. She has good tolerance of first cycle. She will proceed with cycle two.   Oncology Nurse Navigator Documentation     05/13/2023   10:00 AM  Oncology Nurse Navigator Flowsheets  Navigator Follow Up Date: 06/09/2023  Navigator Follow Up Reason: Follow-up Appointment;Chemotherapy  Navigator Radiographer, therapeutic Encounter Type Treatment  Patient Visit Type MedOnc  Treatment Phase Active Tx  Barriers/Navigation Needs Coordination of Care;Education  Interventions Psycho-Social Support  Acuity Level 2-Minimal Needs (1-2 Barriers Identified)  Support Groups/Services Friends and Family  Time Spent with Patient 15

## 2023-05-13 NOTE — Patient Instructions (Signed)

## 2023-05-13 NOTE — Progress Notes (Signed)
 Hematology and Oncology Follow Up Visit  Kelly Chase [id number] 1945-05-13 78 y.o. 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 She is doing fairly well. She still has not followed up with provider to discuss vertebroplasty to help with left hip pain.  She states that she is now able to sleep in her bed but has ordered a cane to have when ambulating for added support. Patient was encouraged to follow up with consult as this may significantly help ease her pain and improve her mobility.  M-spike last month was up to 4.6, IgG 7,394 mg/dL and kappa light chains 1,847 mg/L.  She denies fever, chills, n/v, cough, rash, dizziness, SOB, chest pain, palpitations, abdominal pain or changes in bowel or bladder habits. No swelling in her extremities. She has generalized aches and pains that wax and wane.  No falls or syncope reported.  Appetite and hydration are good. Weight is 158 lbs.     ECOG Performance Status: 1 - Symptomatic but completely ambulatory  Medications:  Allergies as of 05/13/2023       Reactions   Celecoxib Other (See Comments)   GI upset   Esomeprazole Other (See Comments)   Joint pain   Feldene [piroxicam] Other (See Comments)   abd "burning"   Chlorthalidone Palpitations        Medication List        Accurate as of May 13, 2023 10:04 AM. If you have any questions, ask your nurse or doctor.          acetaminophen 500 MG tablet Commonly known as: TYLENOL Take 1,000 mg by mouth every 6 (six) hours as needed.   acyclovir 400 MG tablet Commonly known as: ZOVIRAX Take 1 tablet (400 mg total) by mouth 2 (two) times daily.   amLODipine 10 MG tablet Commonly known as: NORVASC TAKE 1 TABLET(10  MG) BY MOUTH DAILY   cyclobenzaprine 5 MG tablet Commonly known as: FLEXERIL Take 1 tablet (5 mg total) by mouth every 8 (eight) hours as needed for muscle spasms.   dexamethasone 4 MG tablet Commonly known as: DECADRON Take 40 mg by mouth. Take 3 days.   gabapentin 100 MG capsule Commonly known as: NEURONTIN Take by mouth at bedtime as needed.   lisinopril 20 MG tablet Commonly known as: ZESTRIL Take 1 tablet (20 mg total) by mouth daily.   ondansetron 8 MG tablet Commonly known as: Zofran Take 8 mg by mouth 30 to 60 min prior to Cyclophosphamide administration then take 8 mg every 8 hrs as needed for nausea and vomiting.   prochlorperazine 10 MG tablet Commonly known as: COMPAZINE Take 1 tablet (10 mg total) by mouth every 6 (six) hours as needed for nausea or vomiting.   traZODone 50 MG tablet Commonly known as: DESYREL Take 1 tablet (50 mg total) by mouth at bedtime.        Allergies:  Allergies  Allergen Reactions   Celecoxib Other (See Comments)    GI upset   Esomeprazole Other (See Comments)    Joint pain   Feldene [Piroxicam] Other (See Comments)    abd "burning"   Chlorthalidone Palpitations    Past Medical History, Surgical history, Social history, and  Family History were reviewed and updated.  Review of Systems: All other 10 point review of systems is negative.   Physical Exam:  vitals were not taken for this visit.   Wt Readings from Last 3 Encounters:  04/21/23 155 lb 12.8 oz (70.7 kg)  04/12/23 162 lb (73.5 kg)  04/06/23 162 lb (73.5 kg)    Ocular: Sclerae unicteric, pupils equal, round and reactive to light Ear-nose-throat: Oropharynx clear, dentition fair Lymphatic: No cervical or supraclavicular adenopathy Lungs no rales or rhonchi, good excursion bilaterally Heart regular rate and rhythm, no murmur appreciated Abd soft, nontender, positive bowel sounds MSK no focal spinal tenderness, no joint edema Neuro: non-focal, well-oriented,  appropriate affect Breasts: Deferred   Lab Results  Component Value Date   WBC 6.6 05/13/2023   HGB 9.3 (L) 05/13/2023   HCT 27.9 (L) 05/13/2023   MCV 87.7 05/13/2023   PLT 355 05/13/2023   No results found for: "FERRITIN", "IRON", "TIBC", "UIBC", "IRONPCTSAT" Lab Results  Component Value Date   RETICCTPCT 1.7 04/06/2023   RBC 3.18 (L) 05/13/2023   Lab Results  Component Value Date   KPAFRELGTCHN 1,847.5 (H) 04/06/2023   LAMBDASER 4.2 (L) 04/06/2023   KAPLAMBRATIO 959.24 (H) 04/14/2023   Lab Results  Component Value Date   IGGSERUM 7,394 (H) 04/06/2023   IGGSERUM 9,183 (H) 04/06/2023   IGA 51 (L) 04/06/2023   IGA 62 (L) 04/06/2023   IGMSERUM 34 04/06/2023   IGMSERUM 37 04/06/2023   Lab Results  Component Value Date   TOTALPROTELP 11.7 (H) 04/06/2023   ALBUMINELP 3.9 04/06/2023   A1GS 0.4 04/06/2023   A2GS 0.9 04/06/2023   BETS 1.2 04/06/2023   BETA2SER 0.5 09/24/2014   GAMS 5.2 (H) 04/06/2023   MSPIKE 4.6 (H) 04/06/2023   SPEI * 09/24/2014     Chemistry      Component Value Date/Time   NA 133 (L) 04/28/2023 0900   NA 139 10/10/2015 0938   K 3.7 04/28/2023 0900   K 4.2 10/10/2015 0938   CL 106 04/28/2023 0900   CL 105 04/11/2015 0953   CO2 24 04/28/2023 0900   CO2 25 10/10/2015 0938   BUN 20 04/28/2023 0900   BUN 13.6 10/10/2015 0938   CREATININE 1.01 (H) 04/28/2023 0900   CREATININE 1.0 10/10/2015 0938      Component Value Date/Time   CALCIUM 9.1 04/28/2023 0900   CALCIUM 9.7 10/10/2015 0938   ALKPHOS 107 04/28/2023 0900   ALKPHOS 104 10/10/2015 0938   AST 38 04/28/2023 0900   AST 18 10/10/2015 0938   ALT 85 (H) 04/28/2023 0900   ALT 20 10/10/2015 0938   BILITOT 0.8 04/28/2023 0900   BILITOT 0.80 10/10/2015 0938       Impression and Plan: Kelly Chase is a pleasant 78 yo African American female with progressive IgG kappa myeloma.  We will proceed with cycle 2 day 1 today as planned.  Again patient encouraged to follow-up regarding  vertebroplasty.  Follow-up in 4 weeks.   Eileen Stanford, NP 2/21/202510:04 AM

## 2023-05-14 LAB — IGG, IGA, IGM
IgA: 22 mg/dL — ABNORMAL LOW (ref 64–422)
IgG (Immunoglobin G), Serum: 2398 mg/dL — ABNORMAL HIGH (ref 586–1602)
IgM (Immunoglobulin M), Srm: 27 mg/dL (ref 26–217)

## 2023-05-16 ENCOUNTER — Encounter: Payer: Self-pay | Admitting: *Deleted

## 2023-05-16 LAB — PROTEIN ELECTROPHORESIS, SERUM
A/G Ratio: 0.9 (ref 0.7–1.7)
Albumin ELP: 3.5 g/dL (ref 2.9–4.4)
Alpha-1-Globulin: 0.4 g/dL (ref 0.0–0.4)
Alpha-2-Globulin: 0.6 g/dL (ref 0.4–1.0)
Beta Globulin: 1 g/dL (ref 0.7–1.3)
Gamma Globulin: 1.9 g/dL — ABNORMAL HIGH (ref 0.4–1.8)
Globulin, Total: 3.9 g/dL (ref 2.2–3.9)
M-Spike, %: 1.4 g/dL — ABNORMAL HIGH
Total Protein ELP: 7.4 g/dL (ref 6.0–8.5)

## 2023-05-16 LAB — KAPPA/LAMBDA LIGHT CHAINS
Kappa free light chain: 91.2 mg/L — ABNORMAL HIGH (ref 3.3–19.4)
Kappa, lambda light chain ratio: 27.64 — ABNORMAL HIGH (ref 0.26–1.65)
Lambda free light chains: 3.3 mg/L — ABNORMAL LOW (ref 5.7–26.3)

## 2023-05-17 ENCOUNTER — Encounter: Payer: Self-pay | Admitting: Hematology & Oncology

## 2023-05-17 ENCOUNTER — Encounter: Payer: Self-pay | Admitting: Family

## 2023-05-18 ENCOUNTER — Other Ambulatory Visit: Payer: Self-pay

## 2023-05-19 ENCOUNTER — Inpatient Hospital Stay: Payer: Medicare PPO

## 2023-05-19 VITALS — HR 75

## 2023-05-19 DIAGNOSIS — C9000 Multiple myeloma not having achieved remission: Secondary | ICD-10-CM

## 2023-05-19 DIAGNOSIS — Z5112 Encounter for antineoplastic immunotherapy: Secondary | ICD-10-CM | POA: Diagnosis not present

## 2023-05-19 LAB — CBC WITH DIFFERENTIAL (CANCER CENTER ONLY)
Abs Immature Granulocytes: 0.02 10*3/uL (ref 0.00–0.07)
Basophils Absolute: 0 10*3/uL (ref 0.0–0.1)
Basophils Relative: 0 %
Eosinophils Absolute: 0 10*3/uL (ref 0.0–0.5)
Eosinophils Relative: 0 %
HCT: 28.9 % — ABNORMAL LOW (ref 36.0–46.0)
Hemoglobin: 9.6 g/dL — ABNORMAL LOW (ref 12.0–15.0)
Immature Granulocytes: 0 %
Lymphocytes Relative: 24 %
Lymphs Abs: 1.5 10*3/uL (ref 0.7–4.0)
MCH: 29.4 pg (ref 26.0–34.0)
MCHC: 33.2 g/dL (ref 30.0–36.0)
MCV: 88.4 fL (ref 80.0–100.0)
Monocytes Absolute: 0.6 10*3/uL (ref 0.1–1.0)
Monocytes Relative: 9 %
Neutro Abs: 4.2 10*3/uL (ref 1.7–7.7)
Neutrophils Relative %: 67 %
Platelet Count: 321 10*3/uL (ref 150–400)
RBC: 3.27 MIL/uL — ABNORMAL LOW (ref 3.87–5.11)
RDW: 16.3 % — ABNORMAL HIGH (ref 11.5–15.5)
WBC Count: 6.4 10*3/uL (ref 4.0–10.5)
nRBC: 0.3 % — ABNORMAL HIGH (ref 0.0–0.2)

## 2023-05-19 LAB — CMP (CANCER CENTER ONLY)
ALT: 64 U/L — ABNORMAL HIGH (ref 0–44)
AST: 32 U/L (ref 15–41)
Albumin: 4 g/dL (ref 3.5–5.0)
Alkaline Phosphatase: 151 U/L — ABNORMAL HIGH (ref 38–126)
Anion gap: 6 (ref 5–15)
BUN: 12 mg/dL (ref 8–23)
CO2: 25 mmol/L (ref 22–32)
Calcium: 9 mg/dL (ref 8.9–10.3)
Chloride: 107 mmol/L (ref 98–111)
Creatinine: 0.74 mg/dL (ref 0.44–1.00)
GFR, Estimated: >60 mL/min (ref >60)
Glucose, Bld: 93 mg/dL (ref 70–99)
Potassium: 3.5 mmol/L (ref 3.5–5.1)
Sodium: 138 mmol/L (ref 135–145)
Total Bilirubin: 0.7 mg/dL (ref 0.0–1.2)
Total Protein: 6.8 g/dL (ref 6.5–8.1)

## 2023-05-19 MED ORDER — ACETAMINOPHEN 325 MG PO TABS
650.0000 mg | ORAL_TABLET | Freq: Once | ORAL | Status: AC
  Administered 2023-05-19: 650 mg via ORAL
  Filled 2023-05-19: qty 2

## 2023-05-19 MED ORDER — SODIUM CHLORIDE 0.9 % IV SOLN
300.0000 mg/m2 | Freq: Once | INTRAVENOUS | Status: AC
  Administered 2023-05-19: 560 mg via INTRAVENOUS
  Filled 2023-05-19: qty 28

## 2023-05-19 MED ORDER — DARATUMUMAB-HYALURONIDASE-FIHJ 1800-30000 MG-UT/15ML ~~LOC~~ SOLN
1800.0000 mg | Freq: Once | SUBCUTANEOUS | Status: AC
  Administered 2023-05-19: 1800 mg via SUBCUTANEOUS
  Filled 2023-05-19: qty 15

## 2023-05-19 MED ORDER — DEXAMETHASONE 4 MG PO TABS
40.0000 mg | ORAL_TABLET | Freq: Once | ORAL | Status: AC
  Administered 2023-05-19: 40 mg via ORAL
  Filled 2023-05-19: qty 10

## 2023-05-19 MED ORDER — BORTEZOMIB CHEMO SQ INJECTION 3.5 MG (2.5MG/ML)
1.5000 mg/m2 | Freq: Once | INTRAMUSCULAR | Status: AC
  Administered 2023-05-19: 2.75 mg via SUBCUTANEOUS
  Filled 2023-05-19: qty 1.1

## 2023-05-19 MED ORDER — DIPHENHYDRAMINE HCL 25 MG PO CAPS
50.0000 mg | ORAL_CAPSULE | Freq: Once | ORAL | Status: AC
Start: 2023-05-19 — End: 2023-05-19
  Administered 2023-05-19: 50 mg via ORAL
  Filled 2023-05-19: qty 2

## 2023-05-19 NOTE — Patient Instructions (Signed)
 [facility] - A DEPT OF [facility]  Discharge Instructions: Thank you for choosing [facility] to provide your oncology and hematology care.   If you have a lab appointment with the Cancer Center, please go directly to the Cancer Center and check in at the registration area.  Wear comfortable clothing and clothing appropriate for easy access to any Portacath or PICC line.   We strive to give you quality time with your provider. You may need to reschedule your appointment if you arrive late (15 or more minutes).  Arriving late affects you and other patients whose appointments are after yours.  Also, if you miss three or more appointments without notifying the office, you may be dismissed from the clinic at the provider's discretion.      For prescription refill requests, have your pharmacy contact our office and allow 72 hours for refills to be completed.    Today you received the following chemotherapy and/or immunotherapy agents Cytoxan, Velcade, Darzalex      To help prevent nausea and vomiting after your treatment, we encourage you to take your nausea medication as directed.  BELOW ARE SYMPTOMS THAT SHOULD BE REPORTED IMMEDIATELY: *FEVER GREATER THAN 100.4 F (38 C) OR HIGHER *CHILLS OR SWEATING *NAUSEA AND VOMITING THAT IS NOT CONTROLLED WITH YOUR NAUSEA MEDICATION *UNUSUAL SHORTNESS OF BREATH *UNUSUAL BRUISING OR BLEEDING *URINARY PROBLEMS (pain or burning when urinating, or frequent urination) *BOWEL PROBLEMS (unusual diarrhea, constipation, pain near the anus) TENDERNESS IN MOUTH AND THROAT WITH OR WITHOUT PRESENCE OF ULCERS (sore throat, sores in mouth, or a toothache) UNUSUAL RASH, SWELLING OR PAIN  UNUSUAL VAGINAL DISCHARGE OR ITCHING   Items with * indicate a potential emergency and should be followed up as soon as possible or go to the Emergency Department if any problems should occur.  Please show the CHEMOTHERAPY ALERT CARD or  IMMUNOTHERAPY ALERT CARD at check-in to the Emergency Department and triage nurse. Should you have questions after your visit or need to cancel or reschedule your appointment, please contact [facility] - A DEPT OF [facility]  [phone] and follow the prompts.  Office hours are 8:00 a.m. to 4:30 p.m. Monday - Friday. Please note that voicemails left after 4:00 p.m. may not be returned until the following business day.  We are closed weekends and major holidays. You have access to a nurse at all times for urgent questions. Please call the main number to the clinic [phone] and follow the prompts.  For any non-urgent questions, you may also contact your provider using MyChart. We now offer e-Visits for anyone 67 and older to request care online for non-urgent symptoms. For details visit [email or web address].   Also download the MyChart app! Go to the app store, search "MyChart", open the app, select , and log in with your MyChart username and password.

## 2023-05-19 NOTE — Patient Instructions (Signed)

## 2023-05-22 ENCOUNTER — Other Ambulatory Visit: Payer: Self-pay | Admitting: Hematology & Oncology

## 2023-05-23 ENCOUNTER — Encounter: Payer: Self-pay | Admitting: Family

## 2023-05-23 ENCOUNTER — Encounter: Payer: Self-pay | Admitting: Hematology & Oncology

## 2023-05-25 ENCOUNTER — Other Ambulatory Visit: Payer: Self-pay

## 2023-05-26 ENCOUNTER — Inpatient Hospital Stay: Payer: Medicare PPO

## 2023-05-26 ENCOUNTER — Inpatient Hospital Stay: Payer: Medicare PPO | Attending: Hematology & Oncology

## 2023-05-26 DIAGNOSIS — C9000 Multiple myeloma not having achieved remission: Secondary | ICD-10-CM

## 2023-05-26 DIAGNOSIS — Z5111 Encounter for antineoplastic chemotherapy: Secondary | ICD-10-CM | POA: Diagnosis present

## 2023-05-26 DIAGNOSIS — Z5112 Encounter for antineoplastic immunotherapy: Secondary | ICD-10-CM | POA: Diagnosis present

## 2023-05-26 LAB — CMP (CANCER CENTER ONLY)
ALT: 27 U/L (ref 0–44)
AST: 17 U/L (ref 15–41)
Albumin: 4 g/dL (ref 3.5–5.0)
Alkaline Phosphatase: 131 U/L — ABNORMAL HIGH (ref 38–126)
Anion gap: 9 (ref 5–15)
BUN: 14 mg/dL (ref 8–23)
CO2: 20 mmol/L — ABNORMAL LOW (ref 22–32)
Calcium: 9.4 mg/dL (ref 8.9–10.3)
Chloride: 106 mmol/L (ref 98–111)
Creatinine: 0.82 mg/dL (ref 0.44–1.00)
GFR, Estimated: >60 mL/min (ref >60)
Glucose, Bld: 116 mg/dL — ABNORMAL HIGH (ref 70–99)
Potassium: 3.8 mmol/L (ref 3.5–5.1)
Sodium: 135 mmol/L (ref 135–145)
Total Bilirubin: 0.7 mg/dL (ref 0.0–1.2)
Total Protein: 6.8 g/dL (ref 6.5–8.1)

## 2023-05-26 LAB — CBC WITH DIFFERENTIAL (CANCER CENTER ONLY)
Abs Immature Granulocytes: 0.01 10*3/uL (ref 0.00–0.07)
Basophils Absolute: 0 10*3/uL (ref 0.0–0.1)
Basophils Relative: 0 %
Eosinophils Absolute: 0 10*3/uL (ref 0.0–0.5)
Eosinophils Relative: 0 %
HCT: 28.1 % — ABNORMAL LOW (ref 36.0–46.0)
Hemoglobin: 9.4 g/dL — ABNORMAL LOW (ref 12.0–15.0)
Immature Granulocytes: 0 %
Lymphocytes Relative: 21 %
Lymphs Abs: 1.2 10*3/uL (ref 0.7–4.0)
MCH: 30 pg (ref 26.0–34.0)
MCHC: 33.5 g/dL (ref 30.0–36.0)
MCV: 89.8 fL (ref 80.0–100.0)
Monocytes Absolute: 0.5 10*3/uL (ref 0.1–1.0)
Monocytes Relative: 9 %
Neutro Abs: 3.8 10*3/uL (ref 1.7–7.7)
Neutrophils Relative %: 70 %
Platelet Count: 259 10*3/uL (ref 150–400)
RBC: 3.13 MIL/uL — ABNORMAL LOW (ref 3.87–5.11)
RDW: 17.5 % — ABNORMAL HIGH (ref 11.5–15.5)
WBC Count: 5.6 10*3/uL (ref 4.0–10.5)
nRBC: 0 % (ref 0.0–0.2)

## 2023-05-26 MED ORDER — SODIUM CHLORIDE 0.9 % IV SOLN: INTRAVENOUS | Status: DC

## 2023-05-26 MED ORDER — HEPARIN SOD (PORK) LOCK FLUSH 100 UNIT/ML IV SOLN
500.0000 [IU] | Freq: Once | INTRAVENOUS | Status: AC
  Administered 2023-05-26: 500 [IU] via INTRAVENOUS

## 2023-05-26 MED ORDER — DEXAMETHASONE 4 MG PO TABS
40.0000 mg | ORAL_TABLET | Freq: Once | ORAL | Status: AC
  Administered 2023-05-26: 40 mg via ORAL
  Filled 2023-05-26: qty 10

## 2023-05-26 MED ORDER — SODIUM CHLORIDE 0.9 % IV SOLN
300.0000 mg/m2 | Freq: Once | INTRAVENOUS | Status: AC
  Administered 2023-05-26: 560 mg via INTRAVENOUS
  Filled 2023-05-26: qty 10

## 2023-05-26 MED ORDER — BORTEZOMIB CHEMO SQ INJECTION 3.5 MG (2.5MG/ML)
1.5000 mg/m2 | Freq: Once | INTRAMUSCULAR | Status: AC
  Administered 2023-05-26: 2.75 mg via SUBCUTANEOUS
  Filled 2023-05-26: qty 1.1

## 2023-05-26 MED ORDER — DIPHENHYDRAMINE HCL 25 MG PO CAPS
50.0000 mg | ORAL_CAPSULE | Freq: Once | ORAL | Status: AC
  Administered 2023-05-26: 50 mg via ORAL
  Filled 2023-05-26: qty 2

## 2023-05-26 MED ORDER — SODIUM CHLORIDE 0.9% FLUSH
10.0000 mL | INTRAVENOUS | Status: DC | PRN
  Administered 2023-05-26: 10 mL via INTRAVENOUS

## 2023-05-26 MED ORDER — DARATUMUMAB-HYALURONIDASE-FIHJ 1800-30000 MG-UT/15ML ~~LOC~~ SOLN
1800.0000 mg | Freq: Once | SUBCUTANEOUS | Status: AC
  Administered 2023-05-26: 1800 mg via SUBCUTANEOUS
  Filled 2023-05-26: qty 15

## 2023-05-26 MED ORDER — HEPARIN SOD (PORK) LOCK FLUSH 100 UNIT/ML IV SOLN: 500.0000 [IU] | Freq: Once | INTRAVENOUS | Status: DC

## 2023-05-26 MED ORDER — ACETAMINOPHEN 325 MG PO TABS
650.0000 mg | ORAL_TABLET | Freq: Once | ORAL | Status: AC
  Administered 2023-05-26: 650 mg via ORAL
  Filled 2023-05-26: qty 2

## 2023-05-26 MED ORDER — SODIUM CHLORIDE 0.9% FLUSH
10.0000 mL | INTRAVENOUS | Status: DC | PRN
Start: 2023-05-26 — End: 2023-05-26

## 2023-05-26 NOTE — Patient Instructions (Signed)
 [facility]  Discharge Instructions: Thank you for choosing [facility] to provide your oncology and hematology care.   If you have a lab appointment with the Cancer Center, please go directly to the Cancer Center and check in at the registration area.  Wear comfortable clothing and clothing appropriate for easy access to any Portacath or PICC line.   We strive to give you quality time with your provider. You may need to reschedule your appointment if you arrive late (15 or more minutes).  Arriving late affects you and other patients whose appointments are after yours.  Also, if you miss three or more appointments without notifying the office, you may be dismissed from the clinic at the provider's discretion.      For prescription refill requests, have your pharmacy contact our office and allow 72 hours for refills to be completed.    Today you received the following chemotherapy and/or immunotherapy agents faspro, velcade, cytoxan   To help prevent nausea and vomiting after your treatment, we encourage you to take your nausea medication as directed.  BELOW ARE SYMPTOMS THAT SHOULD BE REPORTED IMMEDIATELY: *FEVER GREATER THAN 100.4 F (38 C) OR HIGHER *CHILLS OR SWEATING *NAUSEA AND VOMITING THAT IS NOT CONTROLLED WITH YOUR NAUSEA MEDICATION *UNUSUAL SHORTNESS OF BREATH *UNUSUAL BRUISING OR BLEEDING *URINARY PROBLEMS (pain or burning when urinating, or frequent urination) *BOWEL PROBLEMS (unusual diarrhea, constipation, pain near the anus) TENDERNESS IN MOUTH AND THROAT WITH OR WITHOUT PRESENCE OF ULCERS (sore throat, sores in mouth, or a toothache) UNUSUAL RASH, SWELLING OR PAIN  UNUSUAL VAGINAL DISCHARGE OR ITCHING   Items with * indicate a potential emergency and should be followed up as soon as possible or go to the Emergency Department if any problems should occur.  Please show the CHEMOTHERAPY ALERT CARD or  IMMUNOTHERAPY ALERT CARD at check-in to the Emergency Department and triage nurse. Should you have questions after your visit or need to cancel or reschedule your appointment, please contact [facility] - A DEPT OF Eligha Bridegroom [facility]  [phone] and follow the prompts.  Office hours are 8:00 a.m. to 4:30 p.m. Monday - Friday. Please note that voicemails left after 4:00 p.m. may not be returned until the following business day.  We are closed weekends and major holidays. You have access to a nurse at all times for urgent questions. Please call the main number to the clinic [phone] and follow the prompts.  For any non-urgent questions, you may also contact your provider using MyChart. We now offer e-Visits for anyone 31 and older to request care online for non-urgent symptoms. For details visit [email or web address].   Also download the MyChart app! Go to the app store, search "MyChart", open the app, select [place], and log in with your MyChart username and password.

## 2023-05-26 NOTE — Patient Instructions (Signed)

## 2023-06-02 ENCOUNTER — Other Ambulatory Visit: Payer: Self-pay | Admitting: Oncology

## 2023-06-02 ENCOUNTER — Other Ambulatory Visit: Payer: Self-pay

## 2023-06-02 ENCOUNTER — Inpatient Hospital Stay: Payer: Medicare PPO

## 2023-06-02 DIAGNOSIS — C9000 Multiple myeloma not having achieved remission: Secondary | ICD-10-CM

## 2023-06-02 DIAGNOSIS — Z5112 Encounter for antineoplastic immunotherapy: Secondary | ICD-10-CM | POA: Diagnosis not present

## 2023-06-02 LAB — CMP (CANCER CENTER ONLY)
ALT: 26 U/L (ref 0–44)
AST: 17 U/L (ref 15–41)
Albumin: 4.1 g/dL (ref 3.5–5.0)
Alkaline Phosphatase: 118 U/L (ref 38–126)
Anion gap: 8 (ref 5–15)
BUN: 12 mg/dL (ref 8–23)
CO2: 23 mmol/L (ref 22–32)
Calcium: 8.6 mg/dL — ABNORMAL LOW (ref 8.9–10.3)
Chloride: 106 mmol/L (ref 98–111)
Creatinine: 0.7 mg/dL (ref 0.44–1.00)
GFR, Estimated: >60 mL/min (ref >60)
Glucose, Bld: 108 mg/dL — ABNORMAL HIGH (ref 70–99)
Potassium: 3.6 mmol/L (ref 3.5–5.1)
Sodium: 137 mmol/L (ref 135–145)
Total Bilirubin: 0.8 mg/dL (ref 0.0–1.2)
Total Protein: 6.7 g/dL (ref 6.5–8.1)

## 2023-06-02 LAB — CBC WITH DIFFERENTIAL (CANCER CENTER ONLY)
Abs Immature Granulocytes: 0.02 10*3/uL (ref 0.00–0.07)
Basophils Absolute: 0 10*3/uL (ref 0.0–0.1)
Basophils Relative: 0 %
Eosinophils Absolute: 0 10*3/uL (ref 0.0–0.5)
Eosinophils Relative: 0 %
HCT: 28.2 % — ABNORMAL LOW (ref 36.0–46.0)
Hemoglobin: 9.3 g/dL — ABNORMAL LOW (ref 12.0–15.0)
Immature Granulocytes: 0 %
Lymphocytes Relative: 21 %
Lymphs Abs: 1.1 10*3/uL (ref 0.7–4.0)
MCH: 29.6 pg (ref 26.0–34.0)
MCHC: 33 g/dL (ref 30.0–36.0)
MCV: 89.8 fL (ref 80.0–100.0)
Monocytes Absolute: 0.6 10*3/uL (ref 0.1–1.0)
Monocytes Relative: 11 %
Neutro Abs: 3.4 10*3/uL (ref 1.7–7.7)
Neutrophils Relative %: 68 %
Platelet Count: 254 10*3/uL (ref 150–400)
RBC: 3.14 MIL/uL — ABNORMAL LOW (ref 3.87–5.11)
RDW: 18.3 % — ABNORMAL HIGH (ref 11.5–15.5)
WBC Count: 5.1 10*3/uL (ref 4.0–10.5)
nRBC: 0 % (ref 0.0–0.2)

## 2023-06-02 MED ORDER — DIPHENHYDRAMINE HCL 25 MG PO CAPS
50.0000 mg | ORAL_CAPSULE | Freq: Once | ORAL | Status: AC
  Administered 2023-06-02: 50 mg via ORAL
  Filled 2023-06-02: qty 2

## 2023-06-02 MED ORDER — DARATUMUMAB-HYALURONIDASE-FIHJ 1800-30000 MG-UT/15ML ~~LOC~~ SOLN
1800.0000 mg | Freq: Once | SUBCUTANEOUS | Status: AC
  Administered 2023-06-02: 1800 mg via SUBCUTANEOUS
  Filled 2023-06-02: qty 15

## 2023-06-02 MED ORDER — HEPARIN SOD (PORK) LOCK FLUSH 100 UNIT/ML IV SOLN
500.0000 [IU] | Freq: Once | INTRAVENOUS | Status: AC
  Administered 2023-06-02: 500 [IU] via INTRAVENOUS

## 2023-06-02 MED ORDER — DEXAMETHASONE 4 MG PO TABS
40.0000 mg | ORAL_TABLET | Freq: Once | ORAL | Status: AC
  Administered 2023-06-02: 40 mg via ORAL
  Filled 2023-06-02: qty 10

## 2023-06-02 MED ORDER — SODIUM CHLORIDE 0.9% FLUSH
10.0000 mL | Freq: Once | INTRAVENOUS | Status: AC
  Administered 2023-06-02: 10 mL

## 2023-06-02 MED ORDER — DEXAMETHASONE 4 MG PO TABS: 20.0000 mg | ORAL_TABLET | ORAL | 1 refills | Status: DC

## 2023-06-02 MED ORDER — ACETAMINOPHEN 325 MG PO TABS
650.0000 mg | ORAL_TABLET | Freq: Once | ORAL | Status: AC
Start: 2023-06-02 — End: 2023-06-02
  Administered 2023-06-02: 650 mg via ORAL
  Filled 2023-06-02: qty 2

## 2023-06-02 NOTE — Patient Instructions (Signed)

## 2023-06-02 NOTE — Patient Instructions (Signed)
 [facility]  Discharge Instructions: Thank you for choosing [facility] to provide your oncology and hematology care.   If you have a lab appointment with the Cancer Center, please go directly to the Cancer Center and check in at the registration area.  Wear comfortable clothing and clothing appropriate for easy access to any Portacath or PICC line.   We strive to give you quality time with your provider. You may need to reschedule your appointment if you arrive late (15 or more minutes).  Arriving late affects you and other patients whose appointments are after yours.  Also, if you miss three or more appointments without notifying the office, you may be dismissed from the clinic at the provider's discretion.      For prescription refill requests, have your pharmacy contact our office and allow 72 hours for refills to be completed.    Today you received the following chemotherapy and/or immunotherapy agents Darzalex.      To help prevent nausea and vomiting after your treatment, we encourage you to take your nausea medication as directed.  BELOW ARE SYMPTOMS THAT SHOULD BE REPORTED IMMEDIATELY: *FEVER GREATER THAN 100.4 F (38 C) OR HIGHER *CHILLS OR SWEATING *NAUSEA AND VOMITING THAT IS NOT CONTROLLED WITH YOUR NAUSEA MEDICATION *UNUSUAL SHORTNESS OF BREATH *UNUSUAL BRUISING OR BLEEDING *URINARY PROBLEMS (pain or burning when urinating, or frequent urination) *BOWEL PROBLEMS (unusual diarrhea, constipation, pain near the anus) TENDERNESS IN MOUTH AND THROAT WITH OR WITHOUT PRESENCE OF ULCERS (sore throat, sores in mouth, or a toothache) UNUSUAL RASH, SWELLING OR PAIN  UNUSUAL VAGINAL DISCHARGE OR ITCHING   Items with * indicate a potential emergency and should be followed up as soon as possible or go to the Emergency Department if any problems should occur.  Please show the CHEMOTHERAPY ALERT CARD or IMMUNOTHERAPY  ALERT CARD at check-in to the Emergency Department and triage nurse. Should you have questions after your visit or need to cancel or reschedule your appointment, please contact [facility] - A DEPT OF [facility]  [phone] and follow the prompts.  Office hours are 8:00 a.m. to 4:30 p.m. Monday - Friday. Please note that voicemails left after 4:00 p.m. may not be returned until the following business day.  We are closed weekends and major holidays. You have access to a nurse at all times for urgent questions. Please call the main number to the clinic [phone] and follow the prompts.  For any non-urgent questions, you may also contact your provider using MyChart. We now offer e-Visits for anyone 65 and older to request care online for non-urgent symptoms. For details visit [email or web address].   Also download the MyChart app! Go to the app store, search "MyChart", open the app, select [place], and log in with your MyChart username and password.

## 2023-06-09 ENCOUNTER — Inpatient Hospital Stay: Payer: Medicare PPO

## 2023-06-09 ENCOUNTER — Encounter: Payer: Self-pay | Admitting: *Deleted

## 2023-06-09 ENCOUNTER — Encounter: Payer: Self-pay | Admitting: Hematology & Oncology

## 2023-06-09 ENCOUNTER — Inpatient Hospital Stay: Payer: Medicare PPO | Admitting: Hematology & Oncology

## 2023-06-09 VITALS — BP 121/60 | HR 90

## 2023-06-09 VITALS — BP 133/60 | HR 99 | Temp 98.3°F | Resp 20 | Ht 66.0 in | Wt 160.8 lb

## 2023-06-09 DIAGNOSIS — C9000 Multiple myeloma not having achieved remission: Secondary | ICD-10-CM | POA: Diagnosis not present

## 2023-06-09 DIAGNOSIS — Z5112 Encounter for antineoplastic immunotherapy: Secondary | ICD-10-CM | POA: Diagnosis not present

## 2023-06-09 DIAGNOSIS — M899 Disorder of bone, unspecified: Secondary | ICD-10-CM

## 2023-06-09 DIAGNOSIS — M898X9 Other specified disorders of bone, unspecified site: Secondary | ICD-10-CM

## 2023-06-09 LAB — CBC WITH DIFFERENTIAL (CANCER CENTER ONLY)
Abs Immature Granulocytes: 0.02 10*3/uL (ref 0.00–0.07)
Basophils Absolute: 0 10*3/uL (ref 0.0–0.1)
Basophils Relative: 0 %
Eosinophils Absolute: 0 10*3/uL (ref 0.0–0.5)
Eosinophils Relative: 1 %
HCT: 28.5 % — ABNORMAL LOW (ref 36.0–46.0)
Hemoglobin: 9.3 g/dL — ABNORMAL LOW (ref 12.0–15.0)
Immature Granulocytes: 0 %
Lymphocytes Relative: 12 %
Lymphs Abs: 0.8 10*3/uL (ref 0.7–4.0)
MCH: 29.7 pg (ref 26.0–34.0)
MCHC: 32.6 g/dL (ref 30.0–36.0)
MCV: 91.1 fL (ref 80.0–100.0)
Monocytes Absolute: 0.3 10*3/uL (ref 0.1–1.0)
Monocytes Relative: 5 %
Neutro Abs: 5.2 10*3/uL (ref 1.7–7.7)
Neutrophils Relative %: 82 %
Platelet Count: 369 10*3/uL (ref 150–400)
RBC: 3.13 MIL/uL — ABNORMAL LOW (ref 3.87–5.11)
RDW: 18.2 % — ABNORMAL HIGH (ref 11.5–15.5)
WBC Count: 6.3 10*3/uL (ref 4.0–10.5)
nRBC: 0 % (ref 0.0–0.2)

## 2023-06-09 LAB — CMP (CANCER CENTER ONLY)
ALT: 23 U/L (ref 0–44)
AST: 17 U/L (ref 15–41)
Albumin: 4.1 g/dL (ref 3.5–5.0)
Alkaline Phosphatase: 98 U/L (ref 38–126)
Anion gap: 8 (ref 5–15)
BUN: 11 mg/dL (ref 8–23)
CO2: 23 mmol/L (ref 22–32)
Calcium: 8.5 mg/dL — ABNORMAL LOW (ref 8.9–10.3)
Chloride: 106 mmol/L (ref 98–111)
Creatinine: 0.75 mg/dL (ref 0.44–1.00)
GFR, Estimated: >60 mL/min (ref >60)
Glucose, Bld: 124 mg/dL — ABNORMAL HIGH (ref 70–99)
Potassium: 3.8 mmol/L (ref 3.5–5.1)
Sodium: 137 mmol/L (ref 135–145)
Total Bilirubin: 0.8 mg/dL (ref 0.0–1.2)
Total Protein: 6.5 g/dL (ref 6.5–8.1)

## 2023-06-09 LAB — LACTATE DEHYDROGENASE: LDH: 306 U/L — ABNORMAL HIGH (ref 98–192)

## 2023-06-09 MED ORDER — HEPARIN SOD (PORK) LOCK FLUSH 100 UNIT/ML IV SOLN
500.0000 [IU] | Freq: Once | INTRAVENOUS | Status: AC | PRN
  Administered 2023-06-09: 500 [IU]

## 2023-06-09 MED ORDER — SODIUM CHLORIDE 0.9% FLUSH
10.0000 mL | Freq: Once | INTRAVENOUS | Status: AC | PRN
  Administered 2023-06-09: 10 mL

## 2023-06-09 MED ORDER — ZOLEDRONIC ACID 4 MG/5ML IV CONC
3.5000 mg | Freq: Once | INTRAVENOUS | Status: AC
  Administered 2023-06-09: 3.5 mg via INTRAVENOUS
  Filled 2023-06-09: qty 4.38

## 2023-06-09 MED ORDER — SODIUM CHLORIDE 0.9 % IV SOLN
300.0000 mg/m2 | Freq: Once | INTRAVENOUS | Status: AC
  Administered 2023-06-09: 560 mg via INTRAVENOUS
  Filled 2023-06-09: qty 28

## 2023-06-09 MED ORDER — DARATUMUMAB-HYALURONIDASE-FIHJ 1800-30000 MG-UT/15ML ~~LOC~~ SOLN
1800.0000 mg | Freq: Once | SUBCUTANEOUS | Status: AC
  Administered 2023-06-09: 1800 mg via SUBCUTANEOUS
  Filled 2023-06-09: qty 15

## 2023-06-09 MED ORDER — SODIUM CHLORIDE 0.9 % IV SOLN: INTRAVENOUS | Status: DC

## 2023-06-09 MED ORDER — BORTEZOMIB CHEMO SQ INJECTION 3.5 MG (2.5MG/ML)
1.5000 mg/m2 | Freq: Once | INTRAMUSCULAR | Status: AC
  Administered 2023-06-09: 2.75 mg via SUBCUTANEOUS
  Filled 2023-06-09: qty 1.1

## 2023-06-09 MED ORDER — DIPHENHYDRAMINE HCL 25 MG PO CAPS: 50.0000 mg | ORAL_CAPSULE | Freq: Once | ORAL | Status: DC

## 2023-06-09 MED ORDER — ACETAMINOPHEN 325 MG PO TABS: 650.0000 mg | ORAL_TABLET | Freq: Once | ORAL | Status: DC

## 2023-06-09 MED ORDER — DEXAMETHASONE 4 MG PO TABS: 20.0000 mg | ORAL_TABLET | Freq: Once | ORAL | Status: DC

## 2023-06-09 NOTE — Progress Notes (Signed)
 Hematology and Oncology Follow Up Visit  Kelly Chase [id number] [birth year] 78 y.o. 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  As expected, she has responded incredibly well.  Her monoclonal spike went from 4.6 g/dL down to 1.4 g/dL.  Her IgG level went from 9000 mg/dL down to 1191 mg/dL.  Her kappa light chain went from 185 mg/dL down to 9.1 mg/dL.Marland Kitchen  She feels well.  She really has no complaints.  She has had no bony pain.  She did not want the vertebroplasty..  She has had no problems with bowels or bladder.  She has had no cough or shortness of breath.  She has had no nausea or vomiting.  Of note, is a fact that her erythropoietin level is only 7.1.  We could certainly give her ESA.  I think we are okay right now.  Overall, I would say that her performance status is probably ECOG 1.    Medications:  Allergies as of 06/09/2023       Reactions   Celecoxib Other (See Comments)   GI upset   Esomeprazole Other (See Comments)   Joint pain   Feldene [piroxicam] Other (See Comments)   abd "burning"   Chlorthalidone Palpitations        Medication List        Accurate as of June 09, 2023 11:16 AM. If you have any questions, ask your nurse or doctor.          acetaminophen 500 MG tablet Commonly known as: TYLENOL Take 1,000 mg by mouth every 6 (six) hours as needed.   acyclovir 400 MG tablet Commonly known as: ZOVIRAX Take 1 tablet (400 mg total) by mouth 2 (two) times daily.   amLODipine 10 MG tablet Commonly known as: NORVASC TAKE 1 TABLET(10 MG) BY MOUTH DAILY   cyclobenzaprine 5 MG tablet Commonly known as: FLEXERIL TAKE 1 TABLET BY MOUTH EVERY 8 HOURS AS NEEDED FOR MUSCLE  SPASMS.   dexamethasone 4 MG tablet Commonly known as: DECADRON Take 5 tablets (20 mg total) by mouth See admin instructions. Take 5 tablets the morning of your chemotherapy appointment before arriving to the clinic What changed: Another medication with the same name was removed. Continue taking this medication, and follow the directions you see here. Changed by: Josph Macho   gabapentin 100 MG capsule Commonly known as: NEURONTIN Take by mouth at bedtime as needed.   lisinopril 20 MG tablet Commonly known as: ZESTRIL Take 1 tablet (20 mg total) by mouth daily.   ondansetron 8 MG tablet Commonly known as: Zofran Take 8 mg by mouth 30 to 60 min prior to Cyclophosphamide administration then take 8 mg every 8 hrs as needed for nausea and vomiting.   prochlorperazine 10 MG tablet Commonly known as: COMPAZINE Take 1 tablet (10 mg total) by mouth every 6 (six) hours as needed for nausea or vomiting.   traZODone 50 MG tablet Commonly known as: DESYREL TAKE 1 TABLET BY MOUTH EVERYDAY AT BEDTIME        Allergies:  Allergies  Allergen Reactions   Celecoxib Other (See Comments)    GI upset   Esomeprazole Other (See Comments)  Joint pain   Feldene [Piroxicam] Other (See Comments)    abd "burning"   Chlorthalidone Palpitations    Past Medical History, Surgical history, Social history, and Family History were reviewed and updated.  Review of Systems: Review of Systems  Constitutional: Negative.   HENT: Negative.    Eyes: Negative.   Respiratory: Negative.    Cardiovascular: Negative.   Gastrointestinal: Negative.   Genitourinary: Negative.   Musculoskeletal: Negative.   Skin: Negative.   Neurological: Negative.   Endo/Heme/Allergies: Negative.   Psychiatric/Behavioral: Negative.        Physical Exam:  height is 5\' 6"  (1.676 m) and weight is 160 lb 12.8 oz (72.9 kg). Her oral temperature is 98.3 F (36.8 C). Her blood pressure is 133/60 and her pulse is 99. Her  respiration is 20 and oxygen saturation is 100%.   Wt Readings from Last 3 Encounters:  06/09/23 160 lb 12.8 oz (72.9 kg)  05/13/23 158 lb (71.7 kg)  04/21/23 155 lb 12.8 oz (70.7 kg)    Physical Exam Vitals reviewed.  HENT:     Head: Normocephalic and atraumatic.  Eyes:     Pupils: Pupils are equal, round, and reactive to light.  Cardiovascular:     Rate and Rhythm: Normal rate and regular rhythm.     Heart sounds: Normal heart sounds.  Pulmonary:     Effort: Pulmonary effort is normal.     Breath sounds: Normal breath sounds.  Abdominal:     General: Bowel sounds are normal.     Palpations: Abdomen is soft.  Musculoskeletal:        General: No tenderness or deformity. Normal range of motion.     Cervical back: Normal range of motion.  Lymphadenopathy:     Cervical: No cervical adenopathy.  Skin:    General: Skin is warm and dry.     Findings: No erythema or rash.  Neurological:     Mental Status: She is alert and oriented to person, place, and time.  Psychiatric:        Behavior: Behavior normal.        Thought Content: Thought content normal.        Judgment: Judgment normal.      Lab Results  Component Value Date   WBC 6.3 06/09/2023   HGB 9.3 (L) 06/09/2023   HCT 28.5 (L) 06/09/2023   MCV 91.1 06/09/2023   PLT 369 06/09/2023   No results found for: "FERRITIN", "IRON", "TIBC", "UIBC", "IRONPCTSAT" Lab Results  Component Value Date   RETICCTPCT 1.7 04/06/2023   RBC 3.13 (L) 06/09/2023   Lab Results  Component Value Date   KPAFRELGTCHN 91.2 (H) 05/13/2023   LAMBDASER 3.3 (L) 05/13/2023   KAPLAMBRATIO 27.64 (H) 05/13/2023   Lab Results  Component Value Date   IGGSERUM 2,398 (H) 05/13/2023   IGA 22 (L) 05/13/2023   IGMSERUM 27 05/13/2023   Lab Results  Component Value Date   TOTALPROTELP 7.4 05/13/2023   ALBUMINELP 3.5 05/13/2023   A1GS 0.4 05/13/2023   A2GS 0.6 05/13/2023   BETS 1.0 05/13/2023   BETA2SER 0.5 09/24/2014   GAMS 1.9 (H)  05/13/2023   MSPIKE 1.4 (H) 05/13/2023   SPEI Comment 05/13/2023     Chemistry      Component Value Date/Time   NA 137 06/09/2023 0945   NA 139 10/10/2015 0938   K 3.8 06/09/2023 0945   K 4.2 10/10/2015 0938   CL 106 06/09/2023 0945   CL 105 04/11/2015 0953  CO2 23 06/09/2023 0945   CO2 25 10/10/2015 0938   BUN 11 06/09/2023 0945   BUN 13.6 10/10/2015 0938   CREATININE 0.75 06/09/2023 0945   CREATININE 1.0 10/10/2015 0938      Component Value Date/Time   CALCIUM 8.5 (L) 06/09/2023 0945   CALCIUM 9.7 10/10/2015 0938   ALKPHOS 98 06/09/2023 0945   ALKPHOS 104 10/10/2015 0938   AST 17 06/09/2023 0945   AST 18 10/10/2015 0938   ALT 23 06/09/2023 0945   ALT 20 10/10/2015 0938   BILITOT 0.8 06/09/2023 0945   BILITOT 0.80 10/10/2015 0938       Impression and Plan: Ms. Delude is a pleasant 78 yo African American female with progressive IgG kappa myeloma.   She has responded incredibly well.  It will be interesting to see what her myeloma levels look like today.  Hopefully, if we do see a good response, we might be able to adjust her protocol a little bit.  We will go ahead with her third cycle of treatment.  She will get her Zometa today.  We will have to watch her hemoglobin.  Again, we could certainly utilize ESA if necessary.  We will plan to get her back in 1 month.    3/20/202511:16 AM

## 2023-06-09 NOTE — Progress Notes (Signed)
 Patient has had good response to her myeloma therapy. Her counts have all shown improvement. Patient has good tolerance. Will continue on current regimen.   Oncology Nurse Navigator Documentation     06/09/2023   10:15 AM  Oncology Nurse Navigator Flowsheets  Navigator Follow Up Date: 07/07/2023  Navigator Follow Up Reason: Follow-up Appointment;Chemotherapy  Navigator Location [facility]  Navigator Encounter Type Treatment  Patient Visit Type MedOnc  Treatment Phase Active Tx  Barriers/Navigation Needs Coordination of Care;Education  Interventions None Required  Acuity Level 2-Minimal Needs (1-2 Barriers Identified)  Support Groups/Services Friends and Family  Time Spent with Patient 15

## 2023-06-09 NOTE — Progress Notes (Signed)
 Proceed with zometa 3.5mg . Ok to treat with Ca 8.5 and Albumin 4.1 today.  Anola Gurney [place], [place], BCPS, BCOP 06/09/2023 11:43 AM

## 2023-06-09 NOTE — Patient Instructions (Addendum)
 [facility]  Discharge Instructions: Thank you for choosing [facility] to provide your oncology and hematology care.   If you have a lab appointment with the Cancer Center, please go directly to the Cancer Center and check in at the registration area.  Wear comfortable clothing and clothing appropriate for easy access to any Portacath or PICC line.   We strive to give you quality time with your provider. You may need to reschedule your appointment if you arrive late (15 or more minutes).  Arriving late affects you and other patients whose appointments are after yours.  Also, if you miss three or more appointments without notifying the office, you may be dismissed from the clinic at the provider's discretion.      For prescription refill requests, have your pharmacy contact our office and allow 72 hours for refills to be completed.    Today you received the following chemotherapy and/or immunotherapy agents Velcade, Darzalex, Cytoxan.      To help prevent nausea and vomiting after your treatment, we encourage you to take your nausea medication as directed.  BELOW ARE SYMPTOMS THAT SHOULD BE REPORTED IMMEDIATELY: *FEVER GREATER THAN 100.4 F (38 C) OR HIGHER *CHILLS OR SWEATING *NAUSEA AND VOMITING THAT IS NOT CONTROLLED WITH YOUR NAUSEA MEDICATION *UNUSUAL SHORTNESS OF BREATH *UNUSUAL BRUISING OR BLEEDING *URINARY PROBLEMS (pain or burning when urinating, or frequent urination) *BOWEL PROBLEMS (unusual diarrhea, constipation, pain near the anus) TENDERNESS IN MOUTH AND THROAT WITH OR WITHOUT PRESENCE OF ULCERS (sore throat, sores in mouth, or a toothache) UNUSUAL RASH, SWELLING OR PAIN  UNUSUAL VAGINAL DISCHARGE OR ITCHING   Items with * indicate a potential emergency and should be followed up as soon as possible or go to the Emergency Department if any problems should occur.  Please show the CHEMOTHERAPY ALERT CARD  or IMMUNOTHERAPY ALERT CARD at check-in to the Emergency Department and triage nurse. Should you have questions after your visit or need to cancel or reschedule your appointment, please contact [facility] - A DEPT OF [facility] Brideg[facility]  [phone] and follow the prompts.  Office hours are 8:00 a.m. to 4:30 p.m. Monday - Friday. Please note that voicemails left after 4:00 p.m. may not be returned until the following business day.  We are closed weekends and major holidays. You have access to a nurse at all times for urgent questions. Please call the main number to the clinic [phone] and follow the prompts.  For any non-urgent questions, you may also contact your provider using MyChart. We now offer e-Visits for anyone 53 and older to request care online for non-urgent symptoms. For details visit [email or web address].   Also download the MyChart app! Go to the app store, search "MyChart", open the app, select [place], and log in with your MyChart username and password.

## 2023-06-10 LAB — IGG, IGA, IGM
IgA: 18 mg/dL — ABNORMAL LOW (ref 64–422)
IgG (Immunoglobin G), Serum: 1113 mg/dL (ref 586–1602)
IgM (Immunoglobulin M), Srm: 27 mg/dL (ref 26–217)

## 2023-06-10 LAB — KAPPA/LAMBDA LIGHT CHAINS
Kappa free light chain: 7.5 mg/L (ref 3.3–19.4)
Kappa, lambda light chain ratio: 2.21 — ABNORMAL HIGH (ref 0.26–1.65)
Lambda free light chains: 3.4 mg/L — ABNORMAL LOW (ref 5.7–26.3)

## 2023-06-13 ENCOUNTER — Other Ambulatory Visit

## 2023-06-13 LAB — PROTEIN ELECTROPHORESIS, SERUM
A/G Ratio: 1.2 (ref 0.7–1.7)
Albumin ELP: 3.6 g/dL (ref 2.9–4.4)
Alpha-1-Globulin: 0.4 g/dL (ref 0.0–0.4)
Alpha-2-Globulin: 0.5 g/dL (ref 0.4–1.0)
Beta Globulin: 1 g/dL (ref 0.7–1.3)
Gamma Globulin: 1 g/dL (ref 0.4–1.8)
Globulin, Total: 2.9 g/dL (ref 2.2–3.9)
M-Spike, %: 0.8 g/dL — ABNORMAL HIGH
Total Protein ELP: 6.5 g/dL (ref 6.0–8.5)

## 2023-06-14 ENCOUNTER — Encounter: Payer: Self-pay | Admitting: Hematology & Oncology

## 2023-06-14 ENCOUNTER — Encounter: Payer: Self-pay | Admitting: Family

## 2023-06-15 ENCOUNTER — Inpatient Hospital Stay: Payer: Medicare PPO | Admitting: Dietician

## 2023-06-15 ENCOUNTER — Other Ambulatory Visit: Payer: Self-pay

## 2023-06-15 NOTE — Progress Notes (Signed)
 Nutrition Follow Up: Reached out to patient at home telephone number there was no answer, tried mobile and she picked up.    Patient denies and NIS at this time.  She reports she feels well appetite good.  Little change to her PO intake other than increased her intake of fruit cups.  She continues with [employer] for many meals will do some eggs and grits and high protein Cheerios or Oatmeal with Almond milk.  Continues with Fluids: 3-4 bottles of water, yesterday had little orange juice (buys the low acid--isn't fortified wth Ca+ or Vit D)  Medications: no vitamins but she has 1000 mg Vit D3 available in home and willing to take.  Labs: 06/09/23  Ca 8.5, Hgb 9.3   Anthropometrics: Weight up slightly  Height: 66" Weight:  06/09/23  160.7# 05/13/23  158# 04/21/23  155# 04/12/23  162# UBW: 184-190# BMI: 25.95   Estimated Energy Needs  Kcals: 1900-2300 Protein: 59-74 g Fluid: 2 L   NUTRITION DIAGNOSIS: Food and Nutrition Related Knowledge Deficit related to cancer and associated treatments as evidenced by no prior need for nutrition related information.    INTERVENTION:   Discussed strategies for increasing calcium with additional glass of Almond milk at night and adding cheese and puddings to intake.  Encouraged Vit D3 daily   MONITORING, EVALUATION, GOAL: weight trends, nutrition impact symptoms, PO intake, labs  Goal is weight maintenance  Next Visit: PRN at patient or provider request.  Gennaro Africa, RDN, LDN Registered Dietitian, [facility] Part Time Remote (Usual office hours: Tuesday-Thursday) Mobile: [phone]

## 2023-06-16 ENCOUNTER — Inpatient Hospital Stay: Payer: Medicare PPO

## 2023-06-16 VITALS — BP 118/62 | HR 83 | Temp 97.9°F | Resp 18

## 2023-06-16 DIAGNOSIS — C9000 Multiple myeloma not having achieved remission: Secondary | ICD-10-CM

## 2023-06-16 DIAGNOSIS — Z5112 Encounter for antineoplastic immunotherapy: Secondary | ICD-10-CM | POA: Diagnosis not present

## 2023-06-16 LAB — CBC WITH DIFFERENTIAL (CANCER CENTER ONLY)
Abs Immature Granulocytes: 0.03 10*3/uL (ref 0.00–0.07)
Basophils Absolute: 0 10*3/uL (ref 0.0–0.1)
Basophils Relative: 0 %
Eosinophils Absolute: 0 10*3/uL (ref 0.0–0.5)
Eosinophils Relative: 0 %
HCT: 28 % — ABNORMAL LOW (ref 36.0–46.0)
Hemoglobin: 9.4 g/dL — ABNORMAL LOW (ref 12.0–15.0)
Immature Granulocytes: 0 %
Lymphocytes Relative: 14 %
Lymphs Abs: 1 10*3/uL (ref 0.7–4.0)
MCH: 30.1 pg (ref 26.0–34.0)
MCHC: 33.6 g/dL (ref 30.0–36.0)
MCV: 89.7 fL (ref 80.0–100.0)
Monocytes Absolute: 0.5 10*3/uL (ref 0.1–1.0)
Monocytes Relative: 7 %
Neutro Abs: 5.8 10*3/uL (ref 1.7–7.7)
Neutrophils Relative %: 79 %
Platelet Count: 366 10*3/uL (ref 150–400)
RBC: 3.12 MIL/uL — ABNORMAL LOW (ref 3.87–5.11)
RDW: 18.4 % — ABNORMAL HIGH (ref 11.5–15.5)
WBC Count: 7.4 10*3/uL (ref 4.0–10.5)
nRBC: 0 % (ref 0.0–0.2)

## 2023-06-16 LAB — CMP (CANCER CENTER ONLY)
ALT: 26 U/L (ref 0–44)
AST: 23 U/L (ref 15–41)
Albumin: 3.7 g/dL (ref 3.5–5.0)
Alkaline Phosphatase: 87 U/L (ref 38–126)
Anion gap: 9 (ref 5–15)
BUN: 14 mg/dL (ref 8–23)
CO2: 23 mmol/L (ref 22–32)
Calcium: 9.1 mg/dL (ref 8.9–10.3)
Chloride: 104 mmol/L (ref 98–111)
Creatinine: 0.91 mg/dL (ref 0.44–1.00)
GFR, Estimated: >60 mL/min (ref >60)
Glucose, Bld: 116 mg/dL — ABNORMAL HIGH (ref 70–99)
Potassium: 4.1 mmol/L (ref 3.5–5.1)
Sodium: 136 mmol/L (ref 135–145)
Total Bilirubin: 0.8 mg/dL (ref 0.0–1.2)
Total Protein: 6.9 g/dL (ref 6.5–8.1)

## 2023-06-16 MED ORDER — SODIUM CHLORIDE 0.9 % IV SOLN: Freq: Once | INTRAVENOUS | Status: AC

## 2023-06-16 MED ORDER — HEPARIN SOD (PORK) LOCK FLUSH 100 UNIT/ML IV SOLN
500.0000 [IU] | Freq: Once | INTRAVENOUS | Status: AC
  Administered 2023-06-16: 500 [IU] via INTRAVENOUS

## 2023-06-16 MED ORDER — BORTEZOMIB CHEMO SQ INJECTION 3.5 MG (2.5MG/ML)
1.5000 mg/m2 | Freq: Once | INTRAMUSCULAR | Status: AC
  Administered 2023-06-16: 2.75 mg via SUBCUTANEOUS
  Filled 2023-06-16: qty 1.1

## 2023-06-16 MED ORDER — DEXAMETHASONE 4 MG PO TABS
20.0000 mg | ORAL_TABLET | Freq: Once | ORAL | Status: DC
Start: 2023-06-16 — End: 2023-06-16

## 2023-06-16 MED ORDER — SODIUM CHLORIDE 0.9 % IV SOLN
300.0000 mg/m2 | Freq: Once | INTRAVENOUS | Status: AC
  Administered 2023-06-16: 560 mg via INTRAVENOUS
  Filled 2023-06-16: qty 28

## 2023-06-16 MED ORDER — SODIUM CHLORIDE 0.9% FLUSH
10.0000 mL | Freq: Once | INTRAVENOUS | Status: AC
  Administered 2023-06-16: 10 mL

## 2023-06-16 NOTE — Patient Instructions (Signed)
 [facility]  Discharge Instructions: Thank you for choosing [facility] to provide your oncology and hematology care.   If you have a lab appointment with the Cancer Center, please go directly to the Cancer Center and check in at the registration area.  Wear comfortable clothing and clothing appropriate for easy access to any Portacath or PICC line.   We strive to give you quality time with your provider. You may need to reschedule your appointment if you arrive late (15 or more minutes).  Arriving late affects you and other patients whose appointments are after yours.  Also, if you miss three or more appointments without notifying the office, you may be dismissed from the clinic at the provider's discretion.      For prescription refill requests, have your pharmacy contact our office and allow 72 hours for refills to be completed.    Today you received the following chemotherapy and/or immunotherapy agents Cytoxan/Velcade      To help prevent nausea and vomiting after your treatment, we encourage you to take your nausea medication as directed.  BELOW ARE SYMPTOMS THAT SHOULD BE REPORTED IMMEDIATELY: *FEVER GREATER THAN 100.4 F (38 C) OR HIGHER *CHILLS OR SWEATING *NAUSEA AND VOMITING THAT IS NOT CONTROLLED WITH YOUR NAUSEA MEDICATION *UNUSUAL SHORTNESS OF BREATH *UNUSUAL BRUISING OR BLEEDING *URINARY PROBLEMS (pain or burning when urinating, or frequent urination) *BOWEL PROBLEMS (unusual diarrhea, constipation, pain near the anus) TENDERNESS IN MOUTH AND THROAT WITH OR WITHOUT PRESENCE OF ULCERS (sore throat, sores in mouth, or a toothache) UNUSUAL RASH, SWELLING OR PAIN  UNUSUAL VAGINAL DISCHARGE OR ITCHING   Items with * indicate a potential emergency and should be followed up as soon as possible or go to the Emergency Department if any problems should occur.  Please show the CHEMOTHERAPY ALERT CARD or  IMMUNOTHERAPY ALERT CARD at check-in to the Emergency Department and triage nurse. Should you have questions after your visit or need to cancel or reschedule your appointment, please contact [facility] - A DEPT OF [facility]igh[facility] Brideg[facility]  [phone] and follow the prompts.  Office hours are 8:00 a.m. to 4:30 p.m. Monday - Friday. Please note that voicemails left after 4:00 p.m. may not be returned until the following business day.  We are closed weekends and major holidays. You have access to a nurse at all times for urgent questions. Please call the main number to the clinic [phone] and follow the prompts.  For any non-urgent questions, you may also contact your provider using MyChart. We now offer e-Visits for anyone 63 and older to request care online for non-urgent symptoms. For details visit [email or web address].   Also download the MyChart app! Go to the app store, search "MyChart", open the app, select [place], and log in with your MyChart username and password.

## 2023-06-22 ENCOUNTER — Other Ambulatory Visit: Payer: Self-pay

## 2023-06-23 ENCOUNTER — Inpatient Hospital Stay: Payer: Medicare PPO | Attending: Hematology & Oncology

## 2023-06-23 ENCOUNTER — Inpatient Hospital Stay: Payer: Medicare PPO

## 2023-06-23 VITALS — BP 110/57 | HR 80 | Temp 97.9°F | Resp 20

## 2023-06-23 DIAGNOSIS — Z7969 Long term (current) use of other immunomodulators and immunosuppressants: Secondary | ICD-10-CM | POA: Insufficient documentation

## 2023-06-23 DIAGNOSIS — C9000 Multiple myeloma not having achieved remission: Secondary | ICD-10-CM

## 2023-06-23 DIAGNOSIS — Z5111 Encounter for antineoplastic chemotherapy: Secondary | ICD-10-CM | POA: Insufficient documentation

## 2023-06-23 DIAGNOSIS — M79672 Pain in left foot: Secondary | ICD-10-CM | POA: Diagnosis not present

## 2023-06-23 DIAGNOSIS — Z5112 Encounter for antineoplastic immunotherapy: Secondary | ICD-10-CM | POA: Diagnosis present

## 2023-06-23 LAB — CMP (CANCER CENTER ONLY)
ALT: 19 U/L (ref 0–44)
AST: 13 U/L — ABNORMAL LOW (ref 15–41)
Albumin: 4.2 g/dL (ref 3.5–5.0)
Alkaline Phosphatase: 81 U/L (ref 38–126)
Anion gap: 8 (ref 5–15)
BUN: 20 mg/dL (ref 8–23)
CO2: 24 mmol/L (ref 22–32)
Calcium: 9 mg/dL (ref 8.9–10.3)
Chloride: 104 mmol/L (ref 98–111)
Creatinine: 0.95 mg/dL (ref 0.44–1.00)
GFR, Estimated: >60 mL/min (ref >60)
Glucose, Bld: 128 mg/dL — ABNORMAL HIGH (ref 70–99)
Potassium: 4.2 mmol/L (ref 3.5–5.1)
Sodium: 136 mmol/L (ref 135–145)
Total Bilirubin: 0.7 mg/dL (ref 0.0–1.2)
Total Protein: 6.5 g/dL (ref 6.5–8.1)

## 2023-06-23 LAB — CBC WITH DIFFERENTIAL (CANCER CENTER ONLY)
Abs Immature Granulocytes: 0.03 10*3/uL (ref 0.00–0.07)
Basophils Absolute: 0 10*3/uL (ref 0.0–0.1)
Basophils Relative: 0 %
Eosinophils Absolute: 0 10*3/uL (ref 0.0–0.5)
Eosinophils Relative: 0 %
HCT: 28.4 % — ABNORMAL LOW (ref 36.0–46.0)
Hemoglobin: 9.4 g/dL — ABNORMAL LOW (ref 12.0–15.0)
Immature Granulocytes: 0 %
Lymphocytes Relative: 8 %
Lymphs Abs: 0.7 10*3/uL (ref 0.7–4.0)
MCH: 29.9 pg (ref 26.0–34.0)
MCHC: 33.1 g/dL (ref 30.0–36.0)
MCV: 90.4 fL (ref 80.0–100.0)
Monocytes Absolute: 0.2 10*3/uL (ref 0.1–1.0)
Monocytes Relative: 3 %
Neutro Abs: 7.4 10*3/uL (ref 1.7–7.7)
Neutrophils Relative %: 89 %
Platelet Count: 361 10*3/uL (ref 150–400)
RBC: 3.14 MIL/uL — ABNORMAL LOW (ref 3.87–5.11)
RDW: 17.2 % — ABNORMAL HIGH (ref 11.5–15.5)
WBC Count: 8.4 10*3/uL (ref 4.0–10.5)
nRBC: 0 % (ref 0.0–0.2)

## 2023-06-23 MED ORDER — DARATUMUMAB-HYALURONIDASE-FIHJ 1800-30000 MG-UT/15ML ~~LOC~~ SOLN
1800.0000 mg | Freq: Once | SUBCUTANEOUS | Status: AC
  Administered 2023-06-23: 1800 mg via SUBCUTANEOUS
  Filled 2023-06-23: qty 15

## 2023-06-23 MED ORDER — HEPARIN SOD (PORK) LOCK FLUSH 100 UNIT/ML IV SOLN
500.0000 [IU] | Freq: Once | INTRAVENOUS | Status: AC
  Administered 2023-06-23: 500 [IU] via INTRAVENOUS

## 2023-06-23 MED ORDER — SODIUM CHLORIDE 0.9 % IV SOLN: INTRAVENOUS | Status: DC

## 2023-06-23 MED ORDER — SODIUM CHLORIDE 0.9 % IV SOLN
300.0000 mg/m2 | Freq: Once | INTRAVENOUS | Status: AC
  Administered 2023-06-23: 560 mg via INTRAVENOUS
  Filled 2023-06-23: qty 28

## 2023-06-23 MED ORDER — ACETAMINOPHEN 325 MG PO TABS: 650.0000 mg | ORAL_TABLET | Freq: Once | ORAL | Status: DC

## 2023-06-23 MED ORDER — BORTEZOMIB CHEMO SQ INJECTION 3.5 MG (2.5MG/ML)
1.5000 mg/m2 | Freq: Once | INTRAMUSCULAR | Status: AC
  Administered 2023-06-23: 2.75 mg via SUBCUTANEOUS
  Filled 2023-06-23: qty 1.1

## 2023-06-23 MED ORDER — SODIUM CHLORIDE 0.9% FLUSH
10.0000 mL | INTRAVENOUS | Status: DC | PRN
  Administered 2023-06-23: 10 mL via INTRAVENOUS

## 2023-06-23 MED ORDER — DIPHENHYDRAMINE HCL 25 MG PO CAPS: 50.0000 mg | ORAL_CAPSULE | Freq: Once | ORAL | Status: DC

## 2023-06-23 MED ORDER — DEXAMETHASONE 4 MG PO TABS: 20.0000 mg | ORAL_TABLET | Freq: Once | ORAL | Status: DC

## 2023-06-23 NOTE — Patient Instructions (Signed)
 [facility] - A DEPT OF [facility]  Discharge Instructions: Thank you for choosing [facility] to provide your oncology and hematology care.   If you have a lab appointment with the Cancer Center, please go directly to the Cancer Center and check in at the registration area.  Wear comfortable clothing and clothing appropriate for easy access to any Portacath or PICC line.   We strive to give you quality time with your provider. You may need to reschedule your appointment if you arrive late (15 or more minutes).  Arriving late affects you and other patients whose appointments are after yours.  Also, if you miss three or more appointments without notifying the office, you may be dismissed from the clinic at the provider's discretion.      For prescription refill requests, have your pharmacy contact our office and allow 72 hours for refills to be completed.    Today you received the following chemotherapy and/or immunotherapy agents:  Faspro, Velcade and Cytoxan      To help prevent nausea and vomiting after your treatment, we encourage you to take your nausea medication as directed.  BELOW ARE SYMPTOMS THAT SHOULD BE REPORTED IMMEDIATELY: *FEVER GREATER THAN 100.4 F (38 C) OR HIGHER *CHILLS OR SWEATING *NAUSEA AND VOMITING THAT IS NOT CONTROLLED WITH YOUR NAUSEA MEDICATION *UNUSUAL SHORTNESS OF BREATH *UNUSUAL BRUISING OR BLEEDING *URINARY PROBLEMS (pain or burning when urinating, or frequent urination) *BOWEL PROBLEMS (unusual diarrhea, constipation, pain near the anus) TENDERNESS IN MOUTH AND THROAT WITH OR WITHOUT PRESENCE OF ULCERS (sore throat, sores in mouth, or a toothache) UNUSUAL RASH, SWELLING OR PAIN  UNUSUAL VAGINAL DISCHARGE OR ITCHING   Items with * indicate a potential emergency and should be followed up as soon as possible or go to the Emergency Department if any problems should occur.  Please show the CHEMOTHERAPY ALERT CARD  or IMMUNOTHERAPY ALERT CARD at check-in to the Emergency Department and triage nurse. Should you have questions after your visit or need to cancel or reschedule your appointment, please contact [facility] - A DEPT OF [facility]  [phone] and follow the prompts.  Office hours are 8:00 a.m. to 4:30 p.m. Monday - Friday. Please note that voicemails left after 4:00 p.m. may not be returned until the following business day.  We are closed weekends and major holidays. You have access to a nurse at all times for urgent questions. Please call the main number to the clinic [phone] and follow the prompts.  For any non-urgent questions, you may also contact your provider using MyChart. We now offer e-Visits for anyone 48 and older to request care online for non-urgent symptoms. For details visit [email or web address].   Also download the MyChart app! Go to the app store, search "MyChart", open the app, select [facility], and log in with your MyChart username and password.

## 2023-06-24 ENCOUNTER — Other Ambulatory Visit: Payer: Self-pay

## 2023-07-07 ENCOUNTER — Inpatient Hospital Stay (HOSPITAL_BASED_OUTPATIENT_CLINIC_OR_DEPARTMENT_OTHER): Admitting: Medical Oncology

## 2023-07-07 ENCOUNTER — Inpatient Hospital Stay

## 2023-07-07 ENCOUNTER — Encounter: Payer: Self-pay | Admitting: Medical Oncology

## 2023-07-07 ENCOUNTER — Ambulatory Visit (HOSPITAL_BASED_OUTPATIENT_CLINIC_OR_DEPARTMENT_OTHER)
Admission: RE | Admit: 2023-07-07 | Discharge: 2023-07-07 | Disposition: A | Discharge status: Home / Self Care | Source: Ambulatory Visit | Attending: Medical Oncology | Admitting: Medical Oncology

## 2023-07-07 ENCOUNTER — Encounter: Payer: Self-pay | Admitting: *Deleted

## 2023-07-07 VITALS — BP 107/65 | HR 92 | Temp 98.6°F | Resp 17 | Ht 66.0 in | Wt 156.0 lb

## 2023-07-07 VITALS — BP 117/62 | HR 87 | Resp 18

## 2023-07-07 DIAGNOSIS — M79672 Pain in left foot: Secondary | ICD-10-CM

## 2023-07-07 DIAGNOSIS — C9000 Multiple myeloma not having achieved remission: Secondary | ICD-10-CM

## 2023-07-07 LAB — CMP (CANCER CENTER ONLY)
ALT: 14 U/L (ref 0–44)
AST: 14 U/L — ABNORMAL LOW (ref 15–41)
Albumin: 4.2 g/dL (ref 3.5–5.0)
Alkaline Phosphatase: 70 U/L (ref 38–126)
Anion gap: 9 (ref 5–15)
BUN: 16 mg/dL (ref 8–23)
CO2: 24 mmol/L (ref 22–32)
Calcium: 9.1 mg/dL (ref 8.9–10.3)
Chloride: 102 mmol/L (ref 98–111)
Creatinine: 0.97 mg/dL (ref 0.44–1.00)
GFR, Estimated: 59 mL/min — ABNORMAL LOW (ref >60)
Glucose, Bld: 135 mg/dL — ABNORMAL HIGH (ref 70–99)
Potassium: 4 mmol/L (ref 3.5–5.1)
Sodium: 135 mmol/L (ref 135–145)
Total Bilirubin: 0.9 mg/dL (ref 0.0–1.2)
Total Protein: 6.5 g/dL (ref 6.5–8.1)

## 2023-07-07 LAB — CBC WITH DIFFERENTIAL (CANCER CENTER ONLY)
Abs Immature Granulocytes: 0.02 10*3/uL (ref 0.00–0.07)
Basophils Absolute: 0 10*3/uL (ref 0.0–0.1)
Basophils Relative: 0 %
Eosinophils Absolute: 0.1 10*3/uL (ref 0.0–0.5)
Eosinophils Relative: 1 %
HCT: 28.4 % — ABNORMAL LOW (ref 36.0–46.0)
Hemoglobin: 9.4 g/dL — ABNORMAL LOW (ref 12.0–15.0)
Immature Granulocytes: 0 %
Lymphocytes Relative: 11 %
Lymphs Abs: 0.7 10*3/uL (ref 0.7–4.0)
MCH: 29.6 pg (ref 26.0–34.0)
MCHC: 33.1 g/dL (ref 30.0–36.0)
MCV: 89.3 fL (ref 80.0–100.0)
Monocytes Absolute: 0.5 10*3/uL (ref 0.1–1.0)
Monocytes Relative: 7 %
Neutro Abs: 5.4 10*3/uL (ref 1.7–7.7)
Neutrophils Relative %: 81 %
Platelet Count: 392 10*3/uL (ref 150–400)
RBC: 3.18 MIL/uL — ABNORMAL LOW (ref 3.87–5.11)
RDW: 16.5 % — ABNORMAL HIGH (ref 11.5–15.5)
WBC Count: 6.8 10*3/uL (ref 4.0–10.5)
nRBC: 0 % (ref 0.0–0.2)

## 2023-07-07 LAB — IRON AND IRON BINDING CAPACITY (CC-WL,HP ONLY)
Iron: 49 ug/dL (ref 28–170)
Saturation Ratios: 14 % (ref 10.4–31.8)
TIBC: 342 ug/dL (ref 250–450)
UIBC: 293 ug/dL (ref 148–442)

## 2023-07-07 LAB — RETICULOCYTES
Immature Retic Fract: 14.4 % (ref 2.3–15.9)
RBC.: 3.15 MIL/uL — ABNORMAL LOW (ref 3.87–5.11)
Retic Count, Absolute: 67.1 10*3/uL (ref 19.0–186.0)
Retic Ct Pct: 2.1 % (ref 0.4–3.1)

## 2023-07-07 LAB — FERRITIN: Ferritin: 732 ng/mL — ABNORMAL HIGH (ref 11–307)

## 2023-07-07 LAB — LACTATE DEHYDROGENASE: LDH: 286 U/L — ABNORMAL HIGH (ref 98–192)

## 2023-07-07 MED ORDER — DEXAMETHASONE 4 MG PO TABS: 20.0000 mg | ORAL_TABLET | Freq: Once | ORAL | Status: DC

## 2023-07-07 MED ORDER — SODIUM CHLORIDE 0.9 % IV SOLN
300.0000 mg/m2 | Freq: Once | INTRAVENOUS | Status: AC
  Administered 2023-07-07: 560 mg via INTRAVENOUS
  Filled 2023-07-07: qty 28

## 2023-07-07 MED ORDER — SODIUM CHLORIDE 0.9 % IV SOLN: INTRAVENOUS | Status: DC

## 2023-07-07 MED ORDER — ACETAMINOPHEN 325 MG PO TABS: 650.0000 mg | ORAL_TABLET | Freq: Once | ORAL | Status: DC

## 2023-07-07 MED ORDER — HEPARIN SOD (PORK) LOCK FLUSH 100 UNIT/ML IV SOLN
500.0000 [IU] | Freq: Once | INTRAVENOUS | Status: AC
  Administered 2023-07-07: 500 [IU] via INTRAVENOUS

## 2023-07-07 MED ORDER — BORTEZOMIB CHEMO SQ INJECTION 3.5 MG (2.5MG/ML)
1.5000 mg/m2 | Freq: Once | INTRAMUSCULAR | Status: AC
  Administered 2023-07-07: 2.75 mg via SUBCUTANEOUS
  Filled 2023-07-07: qty 1.1

## 2023-07-07 MED ORDER — DIPHENHYDRAMINE HCL 25 MG PO CAPS
50.0000 mg | ORAL_CAPSULE | Freq: Once | ORAL | Status: DC
Start: 2023-07-07 — End: 2023-07-07

## 2023-07-07 MED ORDER — DARATUMUMAB-HYALURONIDASE-FIHJ 1800-30000 MG-UT/15ML ~~LOC~~ SOLN
1800.0000 mg | Freq: Once | SUBCUTANEOUS | Status: AC
  Administered 2023-07-07: 1800 mg via SUBCUTANEOUS
  Filled 2023-07-07: qty 15

## 2023-07-07 MED ORDER — SODIUM CHLORIDE 0.9% FLUSH
10.0000 mL | Freq: Once | INTRAVENOUS | Status: AC
Start: 2023-07-07 — End: 2023-07-07
  Administered 2023-07-07: 10 mL

## 2023-07-07 NOTE — Progress Notes (Signed)
 Patient is doing well with her treatment. She has minimal side effects. She will proceed with starting cycle four.   Oncology Nurse Navigator Documentation     07/07/2023   10:15 AM  Oncology Nurse Navigator Flowsheets  Navigator Follow Up Date: 08/04/2023  Navigator Follow Up Reason: Follow-up Appointment;Chemotherapy  Navigator Radiographer, therapeutic Encounter Type Treatment  Patient Visit Type MedOnc  Treatment Phase Active Tx  Barriers/Navigation Needs Coordination of Care;Education  Interventions Psycho-Social Support  Acuity Level 2-Minimal Needs (1-2 Barriers Identified)  Support Groups/Services Friends and Family  Time Spent with Patient 15

## 2023-07-07 NOTE — Patient Instructions (Signed)

## 2023-07-07 NOTE — Patient Instructions (Signed)
 [facility] - A DEPT OF [facility]  Discharge Instructions: Thank you for choosing [facility] to provide your oncology and hematology care.   If you have a lab appointment with the Cancer Center, please go directly to the Cancer Center and check in at the registration area.  Wear comfortable clothing and clothing appropriate for easy access to any Portacath or PICC line.   We strive to give you quality time with your provider. You may need to reschedule your appointment if you arrive late (15 or more minutes).  Arriving late affects you and other patients whose appointments are after yours.  Also, if you miss three or more appointments without notifying the office, you may be dismissed from the clinic at the provider's discretion.      For prescription refill requests, have your pharmacy contact our office and allow 72 hours for refills to be completed.    Today you received the following chemotherapy and/or immunotherapy agents Darzalex/VP 16/Cytoxan      To help prevent nausea and vomiting after your treatment, we encourage you to take your nausea medication as directed.  BELOW ARE SYMPTOMS THAT SHOULD BE REPORTED IMMEDIATELY: *FEVER GREATER THAN 100.4 F (38 C) OR HIGHER *CHILLS OR SWEATING *NAUSEA AND VOMITING THAT IS NOT CONTROLLED WITH YOUR NAUSEA MEDICATION *UNUSUAL SHORTNESS OF BREATH *UNUSUAL BRUISING OR BLEEDING *URINARY PROBLEMS (pain or burning when urinating, or frequent urination) *BOWEL PROBLEMS (unusual diarrhea, constipation, pain near the anus) TENDERNESS IN MOUTH AND THROAT WITH OR WITHOUT PRESENCE OF ULCERS (sore throat, sores in mouth, or a toothache) UNUSUAL RASH, SWELLING OR PAIN  UNUSUAL VAGINAL DISCHARGE OR ITCHING   Items with * indicate a potential emergency and should be followed up as soon as possible or go to the Emergency Department if any problems should occur.  Please show the CHEMOTHERAPY ALERT CARD or  IMMUNOTHERAPY ALERT CARD at check-in to the Emergency Department and triage nurse. Should you have questions after your visit or need to cancel or reschedule your appointment, please contact [facility] - A DEPT OF [facility]  [phone] and follow the prompts.  Office hours are 8:00 a.m. to 4:30 p.m. Monday - Friday. Please note that voicemails left after 4:00 p.m. may not be returned until the following business day.  We are closed weekends and major holidays. You have access to a nurse at all times for urgent questions. Please call the main number to the clinic [phone] and follow the prompts.  For any non-urgent questions, you may also contact your provider using MyChart. We now offer e-Visits for anyone 64 and older to request care online for non-urgent symptoms. For details visit [email or web address].   Also download the MyChart app! Go to the app store, search "MyChart", open the app, select [facility], and log in with your MyChart username and password.

## 2023-07-07 NOTE — Progress Notes (Signed)
 Hematology and Oncology Follow Up Visit  Kelly Chase [id number] Aug 20, 1945 78 y.o. 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    She has done well with treatment thus far, her monoclonal spike went from 4.6 g/dL down to 1.4 g/dL.  Her IgG level went from 9000 mg/dL down to 0,865 mg/dL.  Her kappa light chain went from 185 mg/dL down to 78.4 mg/dL.  She is doing well aside from some muscle cramping that she gets at night in her left foot. No injury, rash or redness. Feels like a bone ache. Has tried muscle relaxer or gabapentin do not help with discomfort.   She did not want the vertebroplasty. She has had no problems with bowels or bladder.  She has had no cough or shortness of breath.  She has had no nausea or vomiting.  Of note, is a fact that her erythropoietin level is only 7.1. ESA considered in the future if needed.   Overall, I would say that her performance status is probably ECOG 1.   Wt Readings from Last 3 Encounters:  07/07/23 156 lb (70.8 kg)  06/09/23 160 lb 12.8 oz (72.9 kg)  05/13/23 158 lb (71.7 kg)     Medications:  Allergies as of 07/07/2023       Reactions   Celecoxib Other (See Comments)   GI upset   Esomeprazole Other (See Comments)   Joint pain   Feldene [piroxicam] Other (See Comments)   abd "burning"   Chlorthalidone Palpitations        Medication List        Accurate as of July 07, 2023 10:03 AM. If you have any questions, ask your nurse or doctor.          acetaminophen 500 MG tablet Commonly known as: TYLENOL Take 1,000 mg by mouth every 6 (six) hours as needed.   acyclovir 400 MG tablet Commonly known as: ZOVIRAX Take 1  tablet (400 mg total) by mouth 2 (two) times daily.   amLODipine 10 MG tablet Commonly known as: NORVASC TAKE 1 TABLET(10 MG) BY MOUTH DAILY   cyclobenzaprine 5 MG tablet Commonly known as: FLEXERIL TAKE 1 TABLET BY MOUTH EVERY 8 HOURS AS NEEDED FOR MUSCLE SPASMS.   dexamethasone 4 MG tablet Commonly known as: DECADRON Take 5 tablets (20 mg total) by mouth See admin instructions. Take 5 tablets the morning of your chemotherapy appointment before arriving to the clinic   gabapentin 100 MG capsule Commonly known as: NEURONTIN Take by mouth at bedtime as needed.   lisinopril 20 MG tablet Commonly known as: ZESTRIL Take 1 tablet (20 mg total) by mouth daily.   ondansetron 8 MG tablet Commonly known as: Zofran Take 8 mg by mouth 30 to 60 min prior to Cyclophosphamide administration then take 8 mg every 8 hrs as needed for nausea and vomiting.   prochlorperazine 10 MG tablet Commonly known as: COMPAZINE Take 1 tablet (10 mg total) by mouth every 6 (six) hours as needed for nausea or vomiting.   traZODone 50 MG tablet Commonly known as: DESYREL TAKE 1 TABLET BY MOUTH EVERYDAY AT BEDTIME        Allergies:  Allergies  Allergen Reactions   Celecoxib Other (See Comments)    GI upset   Esomeprazole Other (See Comments)    Joint pain   Feldene [Piroxicam] Other (See Comments)    abd "burning"   Chlorthalidone Palpitations    Past Medical History, Surgical history, Social history, and Family History were reviewed and updated.  Review of Systems: Review of Systems  Constitutional: Negative.   HENT: Negative.    Eyes: Negative.   Respiratory: Negative.    Cardiovascular: Negative.   Gastrointestinal: Negative.   Genitourinary: Negative.   Musculoskeletal: Negative.   Skin: Negative.   Neurological: Negative.   Endo/Heme/Allergies: Negative.   Psychiatric/Behavioral: Negative.        Physical Exam:  height is 5\' 6"  (1.676 m) and weight is 156 lb (70.8 kg). Her  oral temperature is 98.6 F (37 C). Her blood pressure is 107/65 and her pulse is 92. Her respiration is 17 and oxygen saturation is 100%.   Wt Readings from Last 3 Encounters:  07/07/23 156 lb (70.8 kg)  06/09/23 160 lb 12.8 oz (72.9 kg)  05/13/23 158 lb (71.7 kg)    Physical Exam Vitals reviewed.  HENT:     Head: Normocephalic and atraumatic.  Eyes:     Pupils: Pupils are equal, round, and reactive to light.  Cardiovascular:     Rate and Rhythm: Normal rate and regular rhythm.     Heart sounds: Normal heart sounds.  Pulmonary:     Effort: Pulmonary effort is normal.     Breath sounds: Normal breath sounds.  Abdominal:     General: Bowel sounds are normal.     Palpations: Abdomen is soft.  Musculoskeletal:        General: No swelling, tenderness or deformity. Normal range of motion.     Cervical back: Normal range of motion.     Left lower leg: No edema.     Comments: Non tender to palpation. No erythema, edema, or palpable bony abnormalities   Lymphadenopathy:     Cervical: No cervical adenopathy.  Skin:    General: Skin is warm and dry.     Findings: No erythema or rash.  Neurological:     Mental Status: She is alert and oriented to person, place, and time.  Psychiatric:        Behavior: Behavior normal.        Thought Content: Thought content normal.        Judgment: Judgment normal.      Lab Results  Component Value Date   WBC 6.8 07/07/2023   HGB 9.4 (L) 07/07/2023   HCT 28.4 (L) 07/07/2023   MCV 89.3 07/07/2023   PLT 392 07/07/2023   No results found for: "FERRITIN", "IRON", "TIBC", "UIBC", "IRONPCTSAT" Lab Results  Component Value Date   RETICCTPCT 2.1 07/07/2023   RBC 3.15 (L) 07/07/2023   Lab Results  Component Value Date   KPAFRELGTCHN 7.5 06/09/2023   LAMBDASER 3.4 (L) 06/09/2023   KAPLAMBRATIO 2.21 (H) 06/09/2023   Lab Results  Component Value Date   IGGSERUM 1,113 06/09/2023   IGA 18 (L) 06/09/2023   IGMSERUM 27 06/09/2023   Lab  Results  Component Value Date   TOTALPROTELP 6.5 06/09/2023   ALBUMINELP 3.6 06/09/2023   A1GS 0.4 06/09/2023   A2GS 0.5 06/09/2023   BETS 1.0 06/09/2023   BETA2SER 0.5 09/24/2014   GAMS 1.0 06/09/2023   MSPIKE 0.8 (H) 06/09/2023   SPEI Comment 06/09/2023     Chemistry  Component Value Date/Time   NA 136 06/23/2023 0923   NA 139 10/10/2015 0938   K 4.2 06/23/2023 0923   K 4.2 10/10/2015 0938   CL 104 06/23/2023 0923   CL 105 04/11/2015 0953   CO2 24 06/23/2023 0923   CO2 25 10/10/2015 0938   BUN 20 06/23/2023 0923   BUN 13.6 10/10/2015 0938   CREATININE 0.95 06/23/2023 0923   CREATININE 1.0 10/10/2015 0938      Component Value Date/Time   CALCIUM 9.0 06/23/2023 0923   CALCIUM 9.7 10/10/2015 0938   ALKPHOS 81 06/23/2023 0923   ALKPHOS 104 10/10/2015 0938   AST 13 (L) 06/23/2023 0923   AST 18 10/10/2015 0938   ALT 19 06/23/2023 0923   ALT 20 10/10/2015 0938   BILITOT 0.7 06/23/2023 0923   BILITOT 0.80 10/10/2015 1610      Encounter Diagnoses  Name Primary?   Left foot pain Yes   Multiple myeloma not having achieved remission (HCC)     Impression and Plan: Ms. Rothman is a pleasant 78 yo African American female with progressive IgG kappa myeloma. Current therapy is monthly Faspro/Velcade/Cytoxan/Decadron and Q3 month Zometa. She is tolerating this well with significant improvement in labs.   CBC reviewed and acceptable for cycle 3 today CMP pending- ok to proceed forward with treatment if within parameters Xray of foot today after treatment  Cycle 3 today RTC 1 month MD, labs(CBC w/, CMP, MM panel, light chain, Cycle 4)   4/17/202510:03 AM

## 2023-07-08 ENCOUNTER — Other Ambulatory Visit: Payer: Self-pay | Admitting: *Deleted

## 2023-07-08 DIAGNOSIS — C9000 Multiple myeloma not having achieved remission: Secondary | ICD-10-CM

## 2023-07-08 LAB — IGG, IGA, IGM
IgA: 26 mg/dL — ABNORMAL LOW (ref 64–422)
IgG (Immunoglobin G), Serum: 818 mg/dL (ref 586–1602)
IgM (Immunoglobulin M), Srm: 20 mg/dL — ABNORMAL LOW (ref 26–217)

## 2023-07-08 MED ORDER — ACYCLOVIR 400 MG PO TABS: 400.0000 mg | ORAL_TABLET | Freq: Two times a day (BID) | ORAL | 5 refills | Status: DC

## 2023-07-08 MED ORDER — CYCLOBENZAPRINE HCL 5 MG PO TABS: 5.0000 mg | ORAL_TABLET | Freq: Three times a day (TID) | ORAL | 0 refills | Status: AC | PRN

## 2023-07-09 ENCOUNTER — Other Ambulatory Visit: Payer: Self-pay

## 2023-07-11 LAB — KAPPA/LAMBDA LIGHT CHAINS
Kappa free light chain: 7 mg/L (ref 3.3–19.4)
Kappa, lambda light chain ratio: 2 — ABNORMAL HIGH (ref 0.26–1.65)
Lambda free light chains: 3.5 mg/L — ABNORMAL LOW (ref 5.7–26.3)

## 2023-07-12 ENCOUNTER — Encounter: Payer: Self-pay | Admitting: *Deleted

## 2023-07-12 ENCOUNTER — Encounter: Payer: Self-pay | Admitting: Medical Oncology

## 2023-07-13 LAB — PROTEIN ELECTROPHORESIS, SERUM, WITH REFLEX
A/G Ratio: 1.5 (ref 0.7–1.7)
Albumin ELP: 3.8 g/dL (ref 2.9–4.4)
Alpha-1-Globulin: 0.3 g/dL (ref 0.0–0.4)
Alpha-2-Globulin: 0.5 g/dL (ref 0.4–1.0)
Beta Globulin: 1 g/dL (ref 0.7–1.3)
Gamma Globulin: 0.8 g/dL (ref 0.4–1.8)
Globulin, Total: 2.6 g/dL (ref 2.2–3.9)
M-Spike, %: 0.5 g/dL — ABNORMAL HIGH
SPEP Interpretation: 0
Total Protein ELP: 6.4 g/dL (ref 6.0–8.5)

## 2023-07-13 LAB — IMMUNOFIXATION REFLEX, SERUM
IgA: 25 mg/dL — ABNORMAL LOW (ref 64–422)
IgG (Immunoglobin G), Serum: 888 mg/dL (ref 586–1602)
IgM (Immunoglobulin M), Srm: 19 mg/dL — ABNORMAL LOW (ref 26–217)

## 2023-07-14 ENCOUNTER — Inpatient Hospital Stay

## 2023-07-14 ENCOUNTER — Other Ambulatory Visit: Payer: Self-pay | Admitting: *Deleted

## 2023-07-14 DIAGNOSIS — C9000 Multiple myeloma not having achieved remission: Secondary | ICD-10-CM

## 2023-07-14 DIAGNOSIS — Z5112 Encounter for antineoplastic immunotherapy: Secondary | ICD-10-CM | POA: Diagnosis not present

## 2023-07-14 LAB — CMP (CANCER CENTER ONLY)
ALT: 14 U/L (ref 0–44)
AST: 13 U/L — ABNORMAL LOW (ref 15–41)
Albumin: 4.4 g/dL (ref 3.5–5.0)
Alkaline Phosphatase: 61 U/L (ref 38–126)
Anion gap: 8 (ref 5–15)
BUN: 13 mg/dL (ref 8–23)
CO2: 25 mmol/L (ref 22–32)
Calcium: 9.4 mg/dL (ref 8.9–10.3)
Chloride: 104 mmol/L (ref 98–111)
Creatinine: 0.86 mg/dL (ref 0.44–1.00)
GFR, Estimated: >60 mL/min (ref >60)
Glucose, Bld: 143 mg/dL — ABNORMAL HIGH (ref 70–99)
Potassium: 4 mmol/L (ref 3.5–5.1)
Sodium: 137 mmol/L (ref 135–145)
Total Bilirubin: 0.7 mg/dL (ref 0.0–1.2)
Total Protein: 6.5 g/dL (ref 6.5–8.1)

## 2023-07-14 LAB — CBC WITH DIFFERENTIAL (CANCER CENTER ONLY)
Abs Immature Granulocytes: 0.03 10*3/uL (ref 0.00–0.07)
Basophils Absolute: 0 10*3/uL (ref 0.0–0.1)
Basophils Relative: 0 %
Eosinophils Absolute: 0.1 10*3/uL (ref 0.0–0.5)
Eosinophils Relative: 1 %
HCT: 28.8 % — ABNORMAL LOW (ref 36.0–46.0)
Hemoglobin: 9.7 g/dL — ABNORMAL LOW (ref 12.0–15.0)
Immature Granulocytes: 0 %
Lymphocytes Relative: 8 %
Lymphs Abs: 0.6 10*3/uL — ABNORMAL LOW (ref 0.7–4.0)
MCH: 30.3 pg (ref 26.0–34.0)
MCHC: 33.7 g/dL (ref 30.0–36.0)
MCV: 90 fL (ref 80.0–100.0)
Monocytes Absolute: 0.3 10*3/uL (ref 0.1–1.0)
Monocytes Relative: 4 %
Neutro Abs: 6 10*3/uL (ref 1.7–7.7)
Neutrophils Relative %: 87 %
Platelet Count: 477 10*3/uL — ABNORMAL HIGH (ref 150–400)
RBC: 3.2 MIL/uL — ABNORMAL LOW (ref 3.87–5.11)
RDW: 15.6 % — ABNORMAL HIGH (ref 11.5–15.5)
WBC Count: 6.9 10*3/uL (ref 4.0–10.5)
nRBC: 0 % (ref 0.0–0.2)

## 2023-07-14 MED ORDER — SODIUM CHLORIDE 0.9 % IV SOLN
300.0000 mg/m2 | Freq: Once | INTRAVENOUS | Status: AC
  Administered 2023-07-14: 560 mg via INTRAVENOUS
  Filled 2023-07-14: qty 28

## 2023-07-14 MED ORDER — SODIUM CHLORIDE 0.9% FLUSH
10.0000 mL | INTRAVENOUS | Status: DC | PRN
  Administered 2023-07-14: 10 mL via INTRAVENOUS

## 2023-07-14 MED ORDER — BORTEZOMIB CHEMO SQ INJECTION 3.5 MG (2.5MG/ML)
1.5000 mg/m2 | Freq: Once | INTRAMUSCULAR | Status: AC
  Administered 2023-07-14: 2.75 mg via SUBCUTANEOUS
  Filled 2023-07-14: qty 1.1

## 2023-07-14 MED ORDER — DEXAMETHASONE 4 MG PO TABS: 20.0000 mg | ORAL_TABLET | Freq: Once | ORAL | Status: DC

## 2023-07-14 MED ORDER — DEXAMETHASONE 4 MG PO TABS: 20.0000 mg | ORAL_TABLET | ORAL | 1 refills | Status: DC

## 2023-07-14 MED ORDER — SODIUM CHLORIDE 0.9 % IV SOLN: INTRAVENOUS | Status: DC

## 2023-07-14 MED ORDER — HEPARIN SOD (PORK) LOCK FLUSH 100 UNIT/ML IV SOLN
500.0000 [IU] | Freq: Once | INTRAVENOUS | Status: AC
  Administered 2023-07-14: 500 [IU] via INTRAVENOUS

## 2023-07-14 NOTE — Patient Instructions (Signed)

## 2023-07-16 ENCOUNTER — Encounter (HOSPITAL_COMMUNITY): Payer: Self-pay | Admitting: Emergency Medicine

## 2023-07-16 ENCOUNTER — Other Ambulatory Visit: Payer: Self-pay

## 2023-07-16 ENCOUNTER — Emergency Department (HOSPITAL_COMMUNITY)
Admission: EM | Admit: 2023-07-16 | Discharge: 2023-07-16 | Disposition: A | Discharge status: Home / Self Care | Attending: Emergency Medicine | Admitting: Emergency Medicine

## 2023-07-16 DIAGNOSIS — Z8541 Personal history of malignant neoplasm of cervix uteri: Secondary | ICD-10-CM | POA: Diagnosis not present

## 2023-07-16 DIAGNOSIS — E860 Dehydration: Secondary | ICD-10-CM | POA: Insufficient documentation

## 2023-07-16 DIAGNOSIS — R197 Diarrhea, unspecified: Secondary | ICD-10-CM | POA: Insufficient documentation

## 2023-07-16 DIAGNOSIS — Z79899 Other long term (current) drug therapy: Secondary | ICD-10-CM | POA: Diagnosis not present

## 2023-07-16 DIAGNOSIS — I10 Essential (primary) hypertension: Secondary | ICD-10-CM | POA: Diagnosis not present

## 2023-07-16 DIAGNOSIS — R112 Nausea with vomiting, unspecified: Secondary | ICD-10-CM | POA: Diagnosis present

## 2023-07-16 DIAGNOSIS — N179 Acute kidney failure, unspecified: Secondary | ICD-10-CM | POA: Diagnosis not present

## 2023-07-16 LAB — LIPASE, BLOOD: Lipase: 134 U/L — ABNORMAL HIGH (ref 11–51)

## 2023-07-16 LAB — CBC
HCT: 33.3 % — ABNORMAL LOW (ref 36.0–46.0)
Hemoglobin: 10.9 g/dL — ABNORMAL LOW (ref 12.0–15.0)
MCH: 29.4 pg (ref 26.0–34.0)
MCHC: 32.7 g/dL (ref 30.0–36.0)
MCV: 89.8 fL (ref 80.0–100.0)
Platelets: 335 10*3/uL (ref 150–400)
RBC: 3.71 MIL/uL — ABNORMAL LOW (ref 3.87–5.11)
RDW: 17.4 % — ABNORMAL HIGH (ref 11.5–15.5)
WBC: 8.7 10*3/uL (ref 4.0–10.5)
nRBC: 0 % (ref 0.0–0.2)

## 2023-07-16 LAB — COMPREHENSIVE METABOLIC PANEL WITH GFR
ALT: 18 U/L (ref 0–44)
AST: 14 U/L — ABNORMAL LOW (ref 15–41)
Albumin: 4.3 g/dL (ref 3.5–5.0)
Alkaline Phosphatase: 61 U/L (ref 38–126)
Anion gap: 12 (ref 5–15)
BUN: 37 mg/dL — ABNORMAL HIGH (ref 8–23)
CO2: 21 mmol/L — ABNORMAL LOW (ref 22–32)
Calcium: 9.4 mg/dL (ref 8.9–10.3)
Chloride: 106 mmol/L (ref 98–111)
Creatinine, Ser: 1.49 mg/dL — ABNORMAL HIGH (ref 0.44–1.00)
GFR, Estimated: 36 mL/min — ABNORMAL LOW (ref >60)
Glucose, Bld: 113 mg/dL — ABNORMAL HIGH (ref 70–99)
Potassium: 4.1 mmol/L (ref 3.5–5.1)
Sodium: 139 mmol/L (ref 135–145)
Total Bilirubin: 1 mg/dL (ref 0.0–1.2)
Total Protein: 7.2 g/dL (ref 6.5–8.1)

## 2023-07-16 LAB — URINALYSIS, ROUTINE W REFLEX MICROSCOPIC
Bilirubin Urine: NEGATIVE
Glucose, UA: NEGATIVE mg/dL
Hgb urine dipstick: NEGATIVE
Ketones, ur: NEGATIVE mg/dL
Leukocytes,Ua: NEGATIVE
Nitrite: NEGATIVE
Protein, ur: NEGATIVE mg/dL
Specific Gravity, Urine: 1.009 (ref 1.005–1.030)
pH: 5 (ref 5.0–8.0)

## 2023-07-16 MED ORDER — LACTATED RINGERS IV BOLUS
1000.0000 mL | Freq: Once | INTRAVENOUS | Status: AC
  Administered 2023-07-16: 1000 mL via INTRAVENOUS

## 2023-07-16 MED ORDER — ONDANSETRON 4 MG PO TBDP: 4.0000 mg | ORAL_TABLET | Freq: Three times a day (TID) | ORAL | 0 refills | Status: AC | PRN

## 2023-07-16 MED ORDER — ONDANSETRON HCL 4 MG/2ML IJ SOLN
4.0000 mg | Freq: Once | INTRAMUSCULAR | Status: AC
  Administered 2023-07-16: 4 mg via INTRAVENOUS
  Filled 2023-07-16: qty 2

## 2023-07-16 NOTE — ED Provider Notes (Signed)
 [place] [facility] EMERGENCY DEPARTMENT AT [facility] Provider Note   CSN: [id number] Arrival date & time: 07/16/23  1320     History {Add pertinent medical, surgical, social history, OB history to HPI:1} Chief Complaint  Patient presents with   Emesis    Kelly Chase is a 78 y.o. female with PMH as listed below who presents by POV c/o of episodes of emesis and diarrhea - started yesterday. Pt denies any episodes the last couple hours. Pt denies any abd pain. Pt has some nausea. Pt currently receiving cancer treatments for MM.   Per onc note 07/07/23:  progressive IgG kappa myeloma. Current therapy is monthly Faspro/Velcade /Cytoxan /Decadron  and Q3 month Zometa .    Past Medical History:  Diagnosis Date   Acid reflux    Cancer (HCC)    cervical cancer   Hypertension    MGUS (monoclonal gammopathy of unknown significance) 09/24/2014   Multiple myeloma (HCC) 04/06/2023       Home Medications Prior to Admission medications   Medication Sig Start Date End Date Taking? Authorizing Provider  acetaminophen  (TYLENOL ) 500 MG tablet Take 1,000 mg by mouth every 6 (six) hours as needed.    [provider]  acyclovir  (ZOVIRAX ) 400 MG tablet Take 1 tablet (400 mg total) by mouth 2 (two) times daily. 07/08/23   Ivor Mars, MD  amLODipine  (NORVASC ) 10 MG tablet TAKE 1 TABLET(10 MG) BY MOUTH DAILY 07/12/19   Tonna Frederic, MD  cyclobenzaprine  (FLEXERIL ) 5 MG tablet Take 1 tablet (5 mg total) by mouth every 8 (eight) hours as needed for muscle spasms. 07/08/23   Ivor Mars, MD  dexamethasone  (DECADRON ) 4 MG tablet Take 5 tablets (20 mg total) by mouth See admin instructions. Take 5 tablets the morning of your chemotherapy appointment before arriving to the clinic 07/14/23   Ivor Mars, MD  gabapentin  (NEURONTIN ) 100 MG capsule Take by mouth at bedtime as needed. 04/01/23   [provider]  lisinopril  (PRINIVIL ,ZESTRIL ) 20 MG tablet Take 1 tablet (20  mg total) by mouth daily. 04/21/18   Tonna Frederic, MD  ondansetron  (ZOFRAN ) 8 MG tablet Take 8 mg by mouth 30 to 60 min prior to Cyclophosphamide  administration then take 8 mg every 8 hrs as needed for nausea and vomiting. Patient not taking: Reported on 06/09/2023 04/14/23   Ivor Mars, MD  prochlorperazine  (COMPAZINE ) 10 MG tablet Take 1 tablet (10 mg total) by mouth every 6 (six) hours as needed for nausea or vomiting. Patient not taking: Reported on 06/09/2023 04/14/23   Ivor Mars, MD  traZODone  (DESYREL ) 50 MG tablet TAKE 1 TABLET BY MOUTH EVERYDAY AT BEDTIME 05/13/23   Ivor Mars, MD      Allergies    Celecoxib, Esomeprazole, Feldene [piroxicam], and Chlorthalidone    Review of Systems   Review of Systems A 10 point review of systems was performed and is negative unless otherwise reported in HPI.  Physical Exam Updated Vital Signs BP 135/83 (BP Location: Right Arm)   Pulse 97   Temp 97.8 F (36.6 C) (Oral)   Resp 18   Ht 5\' 6"  (1.676 m)   Wt 71.2 kg   SpO2 100%   BMI 25.34 kg/m  Physical Exam General: Normal appearing {Desc; female/female:11659}, lying in bed.  HEENT: PERRLA, Sclera anicteric, MMM, trachea midline.  Cardiology: RRR, no murmurs/rubs/gallops. BL radial and DP pulses equal bilaterally.  Resp: Normal respiratory rate and effort. CTAB, no wheezes, rhonchi, crackles.  Abd: Soft, non-tender, non-distended. No rebound tenderness or guarding.  GU: Deferred. MSK: No peripheral edema or signs of trauma. Extremities without deformity or TTP. No cyanosis or clubbing. Skin: warm, dry. No rashes or lesions. Back: No CVA tenderness Neuro: A&Ox4, CNs II-XII grossly intact. MAEs. Sensation grossly intact.  Psych: Normal mood and affect.   ED Results / Procedures / Treatments   Labs (all labs ordered are listed, but only abnormal results are displayed) Labs Reviewed  CBC - Abnormal; Notable for the following components:      Result Value   RBC  3.71 (*)    Hemoglobin 10.9 (*)    HCT 33.3 (*)    RDW 17.4 (*)    All other components within normal limits  LIPASE, BLOOD  COMPREHENSIVE METABOLIC PANEL WITH GFR  URINALYSIS, ROUTINE W REFLEX MICROSCOPIC    EKG None  Radiology No results found.  Procedures Procedures  {Document cardiac monitor, telemetry assessment procedure when appropriate:1}  Medications Ordered in ED Medications - No data to display  ED Course/ Medical Decision Making/ A&P                          Medical Decision Making Amount and/or Complexity of Data Reviewed Labs: ordered.    This patient presents to the ED for concern of ***, this involves an extensive number of treatment options, and is a complaint that carries with it a high risk of complications and morbidity.  I considered the following differential and admission for this acute, potentially life threatening condition.   MDM:    ***     Labs: I Ordered, and personally interpreted labs.  The pertinent results include:  ***  Imaging Studies ordered: I ordered imaging studies including *** I independently visualized and interpreted imaging. I agree with the radiologist interpretation  Additional history obtained from ***.  External records from outside source obtained and reviewed including ***  Cardiac Monitoring: The patient was maintained on a cardiac monitor.  I personally viewed and interpreted the cardiac monitored which showed an underlying rhythm of: ***  Reevaluation: After the interventions noted above, I reevaluated the patient and found that they have :{resolved/improved/worsened:23923::"improved"}  Social Determinants of Health: ***  Disposition:  ***  Co morbidities that complicate the patient evaluation  Past Medical History:  Diagnosis Date   Acid reflux    Cancer (HCC)    cervical cancer   Hypertension    MGUS (monoclonal gammopathy of unknown significance) 09/24/2014   Multiple myeloma (HCC) 04/06/2023      Medicines No orders of the defined types were placed in this encounter.   I have reviewed the patients home medicines and have made adjustments as needed  Problem List / ED Course: Problem List Items Addressed This Visit   None        {Document critical care time when appropriate:1} {Document review of labs and clinical decision tools ie heart score, Chads2Vasc2 etc:1}  {Document your independent review of radiology images, and any outside records:1} {Document your discussion with family members, caretakers, and with consultants:1} {Document social determinants of health affecting pt's care:1} {Document your decision making why or why not admission, treatments were needed:1}  This note was created using dictation software, which may contain spelling or grammatical errors.

## 2023-07-16 NOTE — ED Triage Notes (Signed)
 Pt arrives ambulatory by POV c/o of episodes of emesis and diarrhea - started yesterday. Pt denies any episodes the last couple hours. Pt denies any abd pain. Pt has some nausea. Pt currently receiving cancer treatments for MM.

## 2023-07-16 NOTE — Discharge Instructions (Signed)
 Thank you for coming to [facility] Emergency Department. You were seen for nausea, vomiting, diarrhea. We did an exam, labs, and imaging, and these showed mild acute kidney injury and likely dehydration. You were given zofran  and fluids.    We have prescribed Zofran  4 mg to use under the tongue as needed every 6-8 hours for nausea and vomiting.  Your lipase was mildly elevated.  This in combination with nausea vomiting and diarrhea could indicate early pancreatitis.  This is managed with bowel rest and fluids.  Please stay well-hydrated at home and follow-up with your primary care physician or oncologist within 1 week.  Do not hesitate to return to the ED or call 911 if you experience: -Worsening symptoms -Nausea vomiting so severe you cannot eat or drink anything -Worsening diarrhea -Blood in the stool -Severe abdominal pain -Lightheadedness, passing out -Fevers/chills -Anything else that concerns you

## 2023-07-21 ENCOUNTER — Inpatient Hospital Stay

## 2023-07-21 ENCOUNTER — Inpatient Hospital Stay: Attending: Hematology & Oncology

## 2023-07-21 VITALS — BP 131/53 | HR 96 | Temp 98.7°F | Resp 17

## 2023-07-21 DIAGNOSIS — Z7969 Long term (current) use of other immunomodulators and immunosuppressants: Secondary | ICD-10-CM | POA: Insufficient documentation

## 2023-07-21 DIAGNOSIS — C9000 Multiple myeloma not having achieved remission: Secondary | ICD-10-CM

## 2023-07-21 DIAGNOSIS — Z5111 Encounter for antineoplastic chemotherapy: Secondary | ICD-10-CM | POA: Diagnosis present

## 2023-07-21 DIAGNOSIS — Z5112 Encounter for antineoplastic immunotherapy: Secondary | ICD-10-CM | POA: Insufficient documentation

## 2023-07-21 LAB — CMP (CANCER CENTER ONLY)
ALT: 13 U/L (ref 0–44)
AST: 12 U/L — ABNORMAL LOW (ref 15–41)
Albumin: 4.3 g/dL (ref 3.5–5.0)
Alkaline Phosphatase: 61 U/L (ref 38–126)
Anion gap: 9 (ref 5–15)
BUN: 18 mg/dL (ref 8–23)
CO2: 25 mmol/L (ref 22–32)
Calcium: 9.4 mg/dL (ref 8.9–10.3)
Chloride: 105 mmol/L (ref 98–111)
Creatinine: 0.85 mg/dL (ref 0.44–1.00)
GFR, Estimated: >60 mL/min (ref >60)
Glucose, Bld: 143 mg/dL — ABNORMAL HIGH (ref 70–99)
Potassium: 3.3 mmol/L — ABNORMAL LOW (ref 3.5–5.1)
Sodium: 139 mmol/L (ref 135–145)
Total Bilirubin: 0.7 mg/dL (ref 0.0–1.2)
Total Protein: 6.5 g/dL (ref 6.5–8.1)

## 2023-07-21 LAB — CBC WITH DIFFERENTIAL (CANCER CENTER ONLY)
Abs Immature Granulocytes: 0.04 10*3/uL (ref 0.00–0.07)
Basophils Absolute: 0 10*3/uL (ref 0.0–0.1)
Basophils Relative: 0 %
Eosinophils Absolute: 0.1 10*3/uL (ref 0.0–0.5)
Eosinophils Relative: 1 %
HCT: 27.5 % — ABNORMAL LOW (ref 36.0–46.0)
Hemoglobin: 9.4 g/dL — ABNORMAL LOW (ref 12.0–15.0)
Immature Granulocytes: 1 %
Lymphocytes Relative: 6 %
Lymphs Abs: 0.5 10*3/uL — ABNORMAL LOW (ref 0.7–4.0)
MCH: 30.5 pg (ref 26.0–34.0)
MCHC: 34.2 g/dL (ref 30.0–36.0)
MCV: 89.3 fL (ref 80.0–100.0)
Monocytes Absolute: 0.4 10*3/uL (ref 0.1–1.0)
Monocytes Relative: 4 %
Neutro Abs: 7.3 10*3/uL (ref 1.7–7.7)
Neutrophils Relative %: 88 %
Platelet Count: 347 10*3/uL (ref 150–400)
RBC: 3.08 MIL/uL — ABNORMAL LOW (ref 3.87–5.11)
RDW: 15.7 % — ABNORMAL HIGH (ref 11.5–15.5)
WBC Count: 8.3 10*3/uL (ref 4.0–10.5)
nRBC: 0 % (ref 0.0–0.2)

## 2023-07-21 MED ORDER — HEPARIN SOD (PORK) LOCK FLUSH 100 UNIT/ML IV SOLN
500.0000 [IU] | Freq: Once | INTRAVENOUS | Status: AC
  Administered 2023-07-21: 500 [IU] via INTRAVENOUS

## 2023-07-21 MED ORDER — DIPHENHYDRAMINE HCL 25 MG PO CAPS: 50.0000 mg | ORAL_CAPSULE | Freq: Once | ORAL | Status: DC

## 2023-07-21 MED ORDER — BORTEZOMIB CHEMO SQ INJECTION 3.5 MG (2.5MG/ML)
1.5000 mg/m2 | Freq: Once | INTRAMUSCULAR | Status: AC
  Administered 2023-07-21: 2.75 mg via SUBCUTANEOUS
  Filled 2023-07-21: qty 1.1

## 2023-07-21 MED ORDER — SODIUM CHLORIDE 0.9% FLUSH
10.0000 mL | INTRAVENOUS | Status: DC | PRN
Start: 2023-07-21 — End: 2023-07-21
  Administered 2023-07-21: 10 mL via INTRAVENOUS

## 2023-07-21 MED ORDER — SODIUM CHLORIDE 0.9 % IV SOLN: INTRAVENOUS | Status: DC

## 2023-07-21 MED ORDER — SODIUM CHLORIDE 0.9 % IV SOLN
300.0000 mg/m2 | Freq: Once | INTRAVENOUS | Status: AC
  Administered 2023-07-21: 560 mg via INTRAVENOUS
  Filled 2023-07-21: qty 28

## 2023-07-21 MED ORDER — ACETAMINOPHEN 325 MG PO TABS: 650.0000 mg | ORAL_TABLET | Freq: Once | ORAL | Status: DC

## 2023-07-21 MED ORDER — DEXAMETHASONE 4 MG PO TABS
20.0000 mg | ORAL_TABLET | Freq: Once | ORAL | Status: DC
Start: 2023-07-21 — End: 2023-07-21

## 2023-07-21 MED ORDER — DARATUMUMAB-HYALURONIDASE-FIHJ 1800-30000 MG-UT/15ML ~~LOC~~ SOLN
1800.0000 mg | Freq: Once | SUBCUTANEOUS | Status: AC
  Administered 2023-07-21: 1800 mg via SUBCUTANEOUS
  Filled 2023-07-21: qty 15

## 2023-07-21 NOTE — Patient Instructions (Signed)

## 2023-07-21 NOTE — Patient Instructions (Signed)
 [facility] - A DEPT OF [facility]  Discharge Instructions: Thank you for choosing [facility] to provide your oncology and hematology care.   If you have a lab appointment with the Cancer Center, please go directly to the Cancer Center and check in at the registration area.  Wear comfortable clothing and clothing appropriate for easy access to any Portacath or PICC line.   We strive to give you quality time with your provider. You may need to reschedule your appointment if you arrive late (15 or more minutes).  Arriving late affects you and other patients whose appointments are after yours.  Also, if you miss three or more appointments without notifying the office, you may be dismissed from the clinic at the provider's discretion.      For prescription refill requests, have your pharmacy contact our office and allow 72 hours for refills to be completed.    Today you received the following chemotherapy and/or immunotherapy agents Cytoxan , Darzalex , Velcade       To help prevent nausea and vomiting after your treatment, we encourage you to take your nausea medication as directed.  BELOW ARE SYMPTOMS THAT SHOULD BE REPORTED IMMEDIATELY: *FEVER GREATER THAN 100.4 F (38 C) OR HIGHER *CHILLS OR SWEATING *NAUSEA AND VOMITING THAT IS NOT CONTROLLED WITH YOUR NAUSEA MEDICATION *UNUSUAL SHORTNESS OF BREATH *UNUSUAL BRUISING OR BLEEDING *URINARY PROBLEMS (pain or burning when urinating, or frequent urination) *BOWEL PROBLEMS (unusual diarrhea, constipation, pain near the anus) TENDERNESS IN MOUTH AND THROAT WITH OR WITHOUT PRESENCE OF ULCERS (sore throat, sores in mouth, or a toothache) UNUSUAL RASH, SWELLING OR PAIN  UNUSUAL VAGINAL DISCHARGE OR ITCHING   Items with * indicate a potential emergency and should be followed up as soon as possible or go to the Emergency Department if any problems should occur.  Please show the CHEMOTHERAPY ALERT CARD or  IMMUNOTHERAPY ALERT CARD at check-in to the Emergency Department and triage nurse. Should you have questions after your visit or need to cancel or reschedule your appointment, please contact [facility] - A DEPT OF [facility]  [phone] and follow the prompts.  Office hours are 8:00 a.m. to 4:30 p.m. Monday - Friday. Please note that voicemails left after 4:00 p.m. may not be returned until the following business day.  We are closed weekends and major holidays. You have access to a nurse at all times for urgent questions. Please call the main number to the clinic [phone] and follow the prompts.  For any non-urgent questions, you may also contact your provider using MyChart. We now offer e-Visits for anyone 74 and older to request care online for non-urgent symptoms. For details visit [email or web address].   Also download the MyChart app! Go to the app store, search "MyChart", open the app, select [place], and log in with your MyChart username and password.

## 2023-08-03 ENCOUNTER — Other Ambulatory Visit: Payer: Self-pay | Admitting: Hematology & Oncology

## 2023-08-03 DIAGNOSIS — C9000 Multiple myeloma not having achieved remission: Secondary | ICD-10-CM

## 2023-08-04 ENCOUNTER — Encounter: Payer: Self-pay | Admitting: *Deleted

## 2023-08-04 ENCOUNTER — Inpatient Hospital Stay

## 2023-08-04 ENCOUNTER — Encounter: Payer: Self-pay | Admitting: Medical Oncology

## 2023-08-04 ENCOUNTER — Inpatient Hospital Stay: Admitting: Medical Oncology

## 2023-08-04 VITALS — BP 121/57 | HR 80 | Temp 98.0°F | Resp 18 | Ht 66.0 in | Wt 156.1 lb

## 2023-08-04 DIAGNOSIS — Z5112 Encounter for antineoplastic immunotherapy: Secondary | ICD-10-CM | POA: Diagnosis not present

## 2023-08-04 DIAGNOSIS — C9000 Multiple myeloma not having achieved remission: Secondary | ICD-10-CM

## 2023-08-04 LAB — CBC WITH DIFFERENTIAL (CANCER CENTER ONLY)
Abs Immature Granulocytes: 0.01 10*3/uL (ref 0.00–0.07)
Basophils Absolute: 0 10*3/uL (ref 0.0–0.1)
Basophils Relative: 0 %
Eosinophils Absolute: 0.1 10*3/uL (ref 0.0–0.5)
Eosinophils Relative: 1 %
HCT: 27.5 % — ABNORMAL LOW (ref 36.0–46.0)
Hemoglobin: 9.3 g/dL — ABNORMAL LOW (ref 12.0–15.0)
Immature Granulocytes: 0 %
Lymphocytes Relative: 15 %
Lymphs Abs: 0.8 10*3/uL (ref 0.7–4.0)
MCH: 30.7 pg (ref 26.0–34.0)
MCHC: 33.8 g/dL (ref 30.0–36.0)
MCV: 90.8 fL (ref 80.0–100.0)
Monocytes Absolute: 0.4 10*3/uL (ref 0.1–1.0)
Monocytes Relative: 8 %
Neutro Abs: 3.8 10*3/uL (ref 1.7–7.7)
Neutrophils Relative %: 76 %
Platelet Count: 404 10*3/uL — ABNORMAL HIGH (ref 150–400)
RBC: 3.03 MIL/uL — ABNORMAL LOW (ref 3.87–5.11)
RDW: 15.8 % — ABNORMAL HIGH (ref 11.5–15.5)
WBC Count: 5.1 10*3/uL (ref 4.0–10.5)
nRBC: 0 % (ref 0.0–0.2)

## 2023-08-04 LAB — CMP (CANCER CENTER ONLY)
ALT: 12 U/L (ref 0–44)
AST: 13 U/L — ABNORMAL LOW (ref 15–41)
Albumin: 4.5 g/dL (ref 3.5–5.0)
Alkaline Phosphatase: 60 U/L (ref 38–126)
Anion gap: 8 (ref 5–15)
BUN: 13 mg/dL (ref 8–23)
CO2: 24 mmol/L (ref 22–32)
Calcium: 9.3 mg/dL (ref 8.9–10.3)
Chloride: 104 mmol/L (ref 98–111)
Creatinine: 0.85 mg/dL (ref 0.44–1.00)
GFR, Estimated: >60 mL/min (ref >60)
Glucose, Bld: 136 mg/dL — ABNORMAL HIGH (ref 70–99)
Potassium: 3.8 mmol/L (ref 3.5–5.1)
Sodium: 136 mmol/L (ref 135–145)
Total Bilirubin: 0.8 mg/dL (ref 0.0–1.2)
Total Protein: 6.5 g/dL (ref 6.5–8.1)

## 2023-08-04 LAB — LACTATE DEHYDROGENASE: LDH: 285 U/L — ABNORMAL HIGH (ref 98–192)

## 2023-08-04 MED ORDER — DIPHENHYDRAMINE HCL 25 MG PO CAPS: 50.0000 mg | ORAL_CAPSULE | Freq: Once | ORAL | Status: DC

## 2023-08-04 MED ORDER — SODIUM CHLORIDE 0.9% FLUSH
10.0000 mL | Freq: Once | INTRAVENOUS | Status: AC
  Administered 2023-08-04: 10 mL

## 2023-08-04 MED ORDER — ACETAMINOPHEN 325 MG PO TABS: 650.0000 mg | ORAL_TABLET | Freq: Once | ORAL | Status: DC

## 2023-08-04 MED ORDER — HEPARIN SOD (PORK) LOCK FLUSH 100 UNIT/ML IV SOLN
500.0000 [IU] | Freq: Once | INTRAVENOUS | Status: AC
  Administered 2023-08-04: 500 [IU] via INTRAVENOUS

## 2023-08-04 MED ORDER — SODIUM CHLORIDE 0.9 % IV SOLN: INTRAVENOUS | Status: DC

## 2023-08-04 MED ORDER — LORAZEPAM 1 MG PO TABS: 0.5000 mg | ORAL_TABLET | Freq: Once | ORAL | Status: DC

## 2023-08-04 MED ORDER — DARATUMUMAB-HYALURONIDASE-FIHJ 1800-30000 MG-UT/15ML ~~LOC~~ SOLN
1800.0000 mg | Freq: Once | SUBCUTANEOUS | Status: AC
  Administered 2023-08-04: 1800 mg via SUBCUTANEOUS
  Filled 2023-08-04: qty 15

## 2023-08-04 MED ORDER — SODIUM CHLORIDE 0.9 % IV SOLN
300.0000 mg/m2 | Freq: Once | INTRAVENOUS | Status: AC
  Administered 2023-08-04: 560 mg via INTRAVENOUS
  Filled 2023-08-04: qty 28

## 2023-08-04 MED ORDER — DEXAMETHASONE 4 MG PO TABS: 20.0000 mg | ORAL_TABLET | Freq: Once | ORAL | Status: DC

## 2023-08-04 MED ORDER — BORTEZOMIB CHEMO SQ INJECTION 3.5 MG (2.5MG/ML)
1.5000 mg/m2 | Freq: Once | INTRAMUSCULAR | Status: AC
  Administered 2023-08-04: 2.75 mg via SUBCUTANEOUS
  Filled 2023-08-04: qty 1.1

## 2023-08-04 NOTE — Progress Notes (Signed)
 Kelly Chase

## 2023-08-04 NOTE — Patient Instructions (Signed)

## 2023-08-04 NOTE — Progress Notes (Signed)
 Patient doing well. Will proceed with cycle five.   Oncology Nurse Navigator Documentation     08/04/2023   10:15 AM  Oncology Nurse Navigator Flowsheets  Navigator Follow Up Date: 08/18/2023  Navigator Follow Up Reason: Follow-up Appointment;Chemotherapy  Navigator Location [facility]  Navigator Encounter Type Treatment  Patient Visit Type MedOnc  Treatment Phase Active Tx  Barriers/Navigation Needs Coordination of Care;Education  Interventions None Required  Acuity Level 2-Minimal Needs (1-2 Barriers Identified)  Support Groups/Services Friends and Family  Time Spent with Patient 15

## 2023-08-04 NOTE — Patient Instructions (Signed)
 [facility] - A DEPT OF [facility]  Discharge Instructions: Thank you for choosing [facility] to provide your oncology and hematology care.   If you have a lab appointment with the Cancer Center, please go directly to the Cancer Center and check in at the registration area.  Wear comfortable clothing and clothing appropriate for easy access to any Portacath or PICC line.   We strive to give you quality time with your provider. You may need to reschedule your appointment if you arrive late (15 or more minutes).  Arriving late affects you and other patients whose appointments are after yours.  Also, if you miss three or more appointments without notifying the office, you may be dismissed from the clinic at the provider's discretion.      For prescription refill requests, have your pharmacy contact our office and allow 72 hours for refills to be completed.    Today you received the following chemotherapy and/or immunotherapy agents Cytoxan , Darzalex , Velcade       To help prevent nausea and vomiting after your treatment, we encourage you to take your nausea medication as directed.  BELOW ARE SYMPTOMS THAT SHOULD BE REPORTED IMMEDIATELY: *FEVER GREATER THAN 100.4 F (38 C) OR HIGHER *CHILLS OR SWEATING *NAUSEA AND VOMITING THAT IS NOT CONTROLLED WITH YOUR NAUSEA MEDICATION *UNUSUAL SHORTNESS OF BREATH *UNUSUAL BRUISING OR BLEEDING *URINARY PROBLEMS (pain or burning when urinating, or frequent urination) *BOWEL PROBLEMS (unusual diarrhea, constipation, pain near the anus) TENDERNESS IN MOUTH AND THROAT WITH OR WITHOUT PRESENCE OF ULCERS (sore throat, sores in mouth, or a toothache) UNUSUAL RASH, SWELLING OR PAIN  UNUSUAL VAGINAL DISCHARGE OR ITCHING   Items with * indicate a potential emergency and should be followed up as soon as possible or go to the Emergency Department if any problems should occur.  Please show the CHEMOTHERAPY ALERT CARD or  IMMUNOTHERAPY ALERT CARD at check-in to the Emergency Department and triage nurse. Should you have questions after your visit or need to cancel or reschedule your appointment, please contact [facility] - A DEPT OF [facility]  [phone] and follow the prompts.  Office hours are 8:00 a.m. to 4:30 p.m. Monday - Friday. Please note that voicemails left after 4:00 p.m. may not be returned until the following business day.  We are closed weekends and major holidays. You have access to a nurse at all times for urgent questions. Please call the main number to the clinic [phone] and follow the prompts.  For any non-urgent questions, you may also contact your provider using MyChart. We now offer e-Visits for anyone 74 and older to request care online for non-urgent symptoms. For details visit [email or web address].   Also download the MyChart app! Go to the app store, search "MyChart", open the app, select [place], and log in with your MyChart username and password.

## 2023-08-04 NOTE — Progress Notes (Signed)
 Hematology and Oncology Follow Up Visit  Kelly Chase [id number] 07-23-1945 78 y.o. 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    Kelly Chase has done well with treatment thus far, Kelly Chase monoclonal spike went from 4.6 g/dL down to 0.5 g/dL.  Kelly Chase IgG level went from 9000 mg/dL down to 409 mg/dL.  Kelly Chase kappa light chain went from 185 mg/dL down to 81.1 mg/dL.  No injury, rash or redness.  Occasional aches and pains  Kelly Chase did not want the vertebroplasty. Kelly Chase has had no problems with bowels or bladder.  Kelly Chase has had no cough or shortness of breath.  Kelly Chase has had no nausea or vomiting.  Of note, is a fact that Kelly Chase erythropoietin  level is only 7.1. ESA considered in the future if needed.   Overall, I would say that Kelly Chase performance status is probably ECOG 1.   Wt Readings from Last 3 Encounters:  08/04/23 156 lb 1.9 oz (70.8 kg)  07/16/23 157 lb (71.2 kg)  07/07/23 156 lb (70.8 kg)     Medications:  Allergies as of 08/04/2023       Reactions   Celecoxib Nausea And Vomiting   Chlorthalidone Palpitations   Feldene [piroxicam] Other (See Comments)   abd "burning"   Esomeprazole Other (See Comments)   Joint pain        Medication List        Accurate as of Aug 04, 2023  1:22 PM. If you have any questions, ask your nurse or doctor.          acetaminophen  500 MG tablet Commonly known as: TYLENOL  Take 1,000 mg by mouth every 6 (six) hours as needed.   acyclovir  400 MG tablet Commonly known as: ZOVIRAX  Take 1 tablet (400 mg total) by mouth 2 (two) times daily.   amLODipine  10 MG tablet Commonly known as: NORVASC  TAKE 1 TABLET(10 MG) BY MOUTH DAILY   cyclobenzaprine  5 MG  tablet Commonly known as: FLEXERIL  Take 1 tablet (5 mg total) by mouth every 8 (eight) hours as needed for muscle spasms.   dexamethasone  4 MG tablet Commonly known as: DECADRON  Take 5 tablets (20 mg total) by mouth See admin instructions. Take 5 tablets the morning of your chemotherapy appointment before arriving to the clinic   gabapentin  100 MG capsule Commonly known as: NEURONTIN  Take by mouth at bedtime as needed.   lisinopril  20 MG tablet Commonly known as: ZESTRIL  Take 1 tablet (20 mg total) by mouth daily.   ondansetron  4 MG disintegrating tablet Commonly known as: ZOFRAN -ODT Take 1 tablet (4 mg total) by mouth every 8 (eight) hours as needed for nausea or vomiting.   ondansetron  8 MG tablet Commonly known as: Zofran  Take 8 mg by mouth 30 to 60 min prior to Cyclophosphamide  administration then take 8 mg every 8 hrs as needed for nausea and vomiting.   prochlorperazine  10 MG tablet Commonly known as: COMPAZINE  Take 1 tablet (10 mg total) by mouth every 6 (six) hours as needed for nausea or vomiting.   traZODone  50 MG tablet Commonly known as: DESYREL  TAKE 1 TABLET BY MOUTH EVERYDAY AT BEDTIME        Allergies:  Allergies  Allergen Reactions   Celecoxib Nausea And Vomiting   Chlorthalidone Palpitations   Feldene [Piroxicam] Other (See Comments)    abd "burning"   Esomeprazole Other (See Comments)    Joint pain    Past Medical History, Surgical history, Social history, and Family History were reviewed and updated.  Review of Systems: Review of Systems  Constitutional: Negative.   HENT: Negative.    Eyes: Negative.   Respiratory: Negative.    Cardiovascular: Negative.   Gastrointestinal: Negative.   Genitourinary: Negative.   Musculoskeletal: Negative.   Skin: Negative.   Neurological: Negative.   Endo/Heme/Allergies: Negative.   Psychiatric/Behavioral: Negative.        Physical Exam:  height is 5\' 6"  (1.676 m) and weight is 156 lb 1.9 oz (70.8  kg). Kelly Chase oral temperature is 98 F (36.7 C). Kelly Chase blood pressure is 121/57 (abnormal) and Kelly Chase pulse is 80. Kelly Chase respiration is 18 and oxygen saturation is 100%.   Wt Readings from Last 3 Encounters:  08/04/23 156 lb 1.9 oz (70.8 kg)  07/16/23 157 lb (71.2 kg)  07/07/23 156 lb (70.8 kg)    Physical Exam Vitals reviewed.  HENT:     Head: Normocephalic and atraumatic.  Eyes:     Pupils: Pupils are equal, round, and reactive to light.  Cardiovascular:     Rate and Rhythm: Normal rate and regular rhythm.     Heart sounds: Normal heart sounds.  Pulmonary:     Effort: Pulmonary effort is normal.     Breath sounds: Normal breath sounds.  Abdominal:     General: Bowel sounds are normal.     Palpations: Abdomen is soft.  Musculoskeletal:        General: No swelling, tenderness or deformity. Normal range of motion.     Cervical back: Normal range of motion.     Left lower leg: No edema.     Comments: Non tender to palpation. No erythema, edema, or palpable bony abnormalities   Lymphadenopathy:     Cervical: No cervical adenopathy.  Skin:    General: Skin is warm and dry.     Findings: No erythema or rash.  Neurological:     Mental Status: Kelly Chase is alert and oriented to person, place, and time.  Psychiatric:        Behavior: Behavior normal.        Thought Content: Thought content normal.        Judgment: Judgment normal.      Lab Results  Component Value Date   WBC 5.1 08/04/2023   HGB 9.3 (L) 08/04/2023   HCT 27.5 (L) 08/04/2023   MCV 90.8 08/04/2023   PLT 404 (H) 08/04/2023   Lab Results  Component Value Date   FERRITIN 732 (H) 07/07/2023   IRON 49 07/07/2023   TIBC 342 07/07/2023   UIBC 293 07/07/2023   IRONPCTSAT 14 07/07/2023   Lab Results  Component Value Date   RETICCTPCT 2.1 07/07/2023   RBC 3.03 (L) 08/04/2023   Lab Results  Component Value Date   KPAFRELGTCHN 7.0 07/07/2023   LAMBDASER 3.5 (L) 07/07/2023   KAPLAMBRATIO 2.00 (H) 07/07/2023   Lab  Results  Component Value Date   IGGSERUM 888 07/07/2023   IGA 25 (L) 07/07/2023   IGMSERUM 19 (L) 07/07/2023   Lab Results  Component Value Date   TOTALPROTELP 6.4 07/07/2023   ALBUMINELP 3.8 07/07/2023   A1GS 0.3 07/07/2023   A2GS 0.5 07/07/2023   BETS 1.0 07/07/2023  BETA2SER 0.5 09/24/2014   GAMS 0.8 07/07/2023   MSPIKE 0.5 (H) 07/07/2023   SPEI Comment 06/09/2023     Chemistry      Component Value Date/Time   NA 136 08/04/2023 0932   NA 139 10/10/2015 0938   K 3.8 08/04/2023 0932   K 4.2 10/10/2015 0938   CL 104 08/04/2023 0932   CL 105 04/11/2015 0953   CO2 24 08/04/2023 0932   CO2 25 10/10/2015 0938   BUN 13 08/04/2023 0932   BUN 13.6 10/10/2015 0938   CREATININE 0.85 08/04/2023 0932   CREATININE 1.0 10/10/2015 0938      Component Value Date/Time   CALCIUM 9.3 08/04/2023 0932   CALCIUM 9.7 10/10/2015 0938   ALKPHOS 60 08/04/2023 0932   ALKPHOS 104 10/10/2015 0938   AST 13 (L) 08/04/2023 0932   AST 18 10/10/2015 0938   ALT 12 08/04/2023 0932   ALT 20 10/10/2015 0938   BILITOT 0.8 08/04/2023 0932   BILITOT 0.80 10/10/2015 1610      Encounter Diagnosis  Name Primary?   Multiple myeloma not having achieved remission (HCC) Yes   Impression and Plan: Kelly Chase is a pleasant 78 yo African American female with progressive IgG kappa myeloma. Current therapy is monthly Faspro/Velcade /Cytoxan /Decadron  and Q3 month Zometa . Kelly Chase is tolerating this well with significant improvement in labs.   CBC reviewed and acceptable for cycle 5 today CMP pending- ok to proceed forward with treatment if within parameters  Cycle 5 today RTC 1 month MD, labs(CBC w/, CMP, MM panel, light chain, Cycle 6)   5/15/20251:22 PM

## 2023-08-05 LAB — KAPPA/LAMBDA LIGHT CHAINS
Kappa free light chain: 6.2 mg/L (ref 3.3–19.4)
Kappa, lambda light chain ratio: 1.94 — ABNORMAL HIGH (ref 0.26–1.65)
Lambda free light chains: 3.2 mg/L — ABNORMAL LOW (ref 5.7–26.3)

## 2023-08-06 ENCOUNTER — Other Ambulatory Visit: Payer: Self-pay

## 2023-08-09 LAB — MULTIPLE MYELOMA PANEL, SERUM
Albumin SerPl Elph-Mcnc: 3.7 g/dL (ref 2.9–4.4)
Albumin/Glob SerPl: 1.5 (ref 0.7–1.7)
Alpha 1: 0.3 g/dL (ref 0.0–0.4)
Alpha2 Glob SerPl Elph-Mcnc: 0.6 g/dL (ref 0.4–1.0)
B-Globulin SerPl Elph-Mcnc: 1 g/dL (ref 0.7–1.3)
Gamma Glob SerPl Elph-Mcnc: 0.6 g/dL (ref 0.4–1.8)
Globulin, Total: 2.5 g/dL (ref 2.2–3.9)
IgA: 29 mg/dL — ABNORMAL LOW (ref 64–422)
IgG (Immunoglobin G), Serum: 750 mg/dL (ref 586–1602)
IgM (Immunoglobulin M), Srm: 16 mg/dL — ABNORMAL LOW (ref 26–217)
M Protein SerPl Elph-Mcnc: 0.4 g/dL — ABNORMAL HIGH
Total Protein ELP: 6.2 g/dL (ref 6.0–8.5)

## 2023-08-11 ENCOUNTER — Inpatient Hospital Stay

## 2023-08-11 VITALS — BP 124/72 | HR 88 | Temp 97.5°F | Resp 18

## 2023-08-11 DIAGNOSIS — C9000 Multiple myeloma not having achieved remission: Secondary | ICD-10-CM

## 2023-08-11 DIAGNOSIS — Z5112 Encounter for antineoplastic immunotherapy: Secondary | ICD-10-CM | POA: Diagnosis not present

## 2023-08-11 LAB — CBC WITH DIFFERENTIAL (CANCER CENTER ONLY)
Abs Immature Granulocytes: 0.03 10*3/uL (ref 0.00–0.07)
Basophils Absolute: 0 10*3/uL (ref 0.0–0.1)
Basophils Relative: 1 %
Eosinophils Absolute: 0 10*3/uL (ref 0.0–0.5)
Eosinophils Relative: 1 %
HCT: 28.2 % — ABNORMAL LOW (ref 36.0–46.0)
Hemoglobin: 9.6 g/dL — ABNORMAL LOW (ref 12.0–15.0)
Immature Granulocytes: 1 %
Lymphocytes Relative: 10 %
Lymphs Abs: 0.6 10*3/uL — ABNORMAL LOW (ref 0.7–4.0)
MCH: 31 pg (ref 26.0–34.0)
MCHC: 34 g/dL (ref 30.0–36.0)
MCV: 91 fL (ref 80.0–100.0)
Monocytes Absolute: 0.3 10*3/uL (ref 0.1–1.0)
Monocytes Relative: 5 %
Neutro Abs: 4.6 10*3/uL (ref 1.7–7.7)
Neutrophils Relative %: 82 %
Platelet Count: 300 10*3/uL (ref 150–400)
RBC: 3.1 MIL/uL — ABNORMAL LOW (ref 3.87–5.11)
RDW: 15.7 % — ABNORMAL HIGH (ref 11.5–15.5)
WBC Count: 5.5 10*3/uL (ref 4.0–10.5)
nRBC: 0 % (ref 0.0–0.2)

## 2023-08-11 LAB — CMP (CANCER CENTER ONLY)
ALT: 14 U/L (ref 0–44)
AST: 14 U/L — ABNORMAL LOW (ref 15–41)
Albumin: 4.6 g/dL (ref 3.5–5.0)
Alkaline Phosphatase: 63 U/L (ref 38–126)
Anion gap: 9 (ref 5–15)
BUN: 19 mg/dL (ref 8–23)
CO2: 24 mmol/L (ref 22–32)
Calcium: 9.6 mg/dL (ref 8.9–10.3)
Chloride: 105 mmol/L (ref 98–111)
Creatinine: 0.88 mg/dL (ref 0.44–1.00)
GFR, Estimated: >60 mL/min (ref >60)
Glucose, Bld: 132 mg/dL — ABNORMAL HIGH (ref 70–99)
Potassium: 4 mmol/L (ref 3.5–5.1)
Sodium: 138 mmol/L (ref 135–145)
Total Bilirubin: 0.7 mg/dL (ref 0.0–1.2)
Total Protein: 6.4 g/dL — ABNORMAL LOW (ref 6.5–8.1)

## 2023-08-11 MED ORDER — BORTEZOMIB CHEMO SQ INJECTION 3.5 MG (2.5MG/ML)
1.5000 mg/m2 | Freq: Once | INTRAMUSCULAR | Status: AC
  Administered 2023-08-11: 2.75 mg via SUBCUTANEOUS
  Filled 2023-08-11: qty 1.1

## 2023-08-11 MED ORDER — HEPARIN SOD (PORK) LOCK FLUSH 100 UNIT/ML IV SOLN
500.0000 [IU] | Freq: Once | INTRAVENOUS | Status: AC
  Administered 2023-08-11: 500 [IU] via INTRAVENOUS

## 2023-08-11 MED ORDER — DEXAMETHASONE 4 MG PO TABS: 20.0000 mg | ORAL_TABLET | Freq: Once | ORAL | Status: DC

## 2023-08-11 MED ORDER — SODIUM CHLORIDE 0.9 % IV SOLN: Freq: Once | INTRAVENOUS | Status: AC

## 2023-08-11 MED ORDER — SODIUM CHLORIDE 0.9% FLUSH
10.0000 mL | Freq: Once | INTRAVENOUS | Status: AC
  Administered 2023-08-11: 10 mL via INTRAVENOUS

## 2023-08-11 MED ORDER — LIDOCAINE-PRILOCAINE 2.5-2.5 % EX CREA: 1.0000 | TOPICAL_CREAM | CUTANEOUS | 0 refills | Status: DC | PRN

## 2023-08-11 MED ORDER — SODIUM CHLORIDE 0.9 % IV SOLN
300.0000 mg/m2 | Freq: Once | INTRAVENOUS | Status: AC
  Administered 2023-08-11: 560 mg via INTRAVENOUS
  Filled 2023-08-11: qty 28

## 2023-08-11 NOTE — Patient Instructions (Signed)

## 2023-08-11 NOTE — Patient Instructions (Signed)
 [facility] - A DEPT OF [facility]  Discharge Instructions: Thank you for choosing [facility] to provide your oncology and hematology care.   If you have a lab appointment with the Cancer Center, please go directly to the Cancer Center and check in at the registration area.  Wear comfortable clothing and clothing appropriate for easy access to any Portacath or PICC line.   We strive to give you quality time with your provider. You may need to reschedule your appointment if you arrive late (15 or more minutes).  Arriving late affects you and other patients whose appointments are after yours.  Also, if you miss three or more appointments without notifying the office, you may be dismissed from the clinic at the provider's discretion.      For prescription refill requests, have your pharmacy contact our office and allow 72 hours for refills to be completed.    Today you received the following chemotherapy and/or immunotherapy agents Velcade  and Cytoxan       To help prevent nausea and vomiting after your treatment, we encourage you to take your nausea medication as directed.  BELOW ARE SYMPTOMS THAT SHOULD BE REPORTED IMMEDIATELY: *FEVER GREATER THAN 100.4 F (38 C) OR HIGHER *CHILLS OR SWEATING *NAUSEA AND VOMITING THAT IS NOT CONTROLLED WITH YOUR NAUSEA MEDICATION *UNUSUAL SHORTNESS OF BREATH *UNUSUAL BRUISING OR BLEEDING *URINARY PROBLEMS (pain or burning when urinating, or frequent urination) *BOWEL PROBLEMS (unusual diarrhea, constipation, pain near the anus) TENDERNESS IN MOUTH AND THROAT WITH OR WITHOUT PRESENCE OF ULCERS (sore throat, sores in mouth, or a toothache) UNUSUAL RASH, SWELLING OR PAIN  UNUSUAL VAGINAL DISCHARGE OR ITCHING   Items with * indicate a potential emergency and should be followed up as soon as possible or go to the Emergency Department if any problems should occur.  Please show the CHEMOTHERAPY ALERT CARD or  IMMUNOTHERAPY ALERT CARD at check-in to the Emergency Department and triage nurse. Should you have questions after your visit or need to cancel or reschedule your appointment, please contact [facility] - A DEPT OF [facility]  [phone] and follow the prompts.  Office hours are 8:00 a.m. to 4:30 p.m. Monday - Friday. Please note that voicemails left after 4:00 p.m. may not be returned until the following business day.  We are closed weekends and major holidays. You have access to a nurse at all times for urgent questions. Please call the main number to the clinic [phone] and follow the prompts.  For any non-urgent questions, you may also contact your provider using MyChart. We now offer e-Visits for anyone 6 and older to request care online for non-urgent symptoms. For details visit [email or web address].   Also download the MyChart app! Go to the app store, search "MyChart", open the app, select [place], and log in with your MyChart username and password.

## 2023-08-15 ENCOUNTER — Other Ambulatory Visit: Payer: Self-pay

## 2023-08-18 ENCOUNTER — Encounter: Payer: Self-pay | Admitting: Hematology & Oncology

## 2023-08-18 ENCOUNTER — Inpatient Hospital Stay

## 2023-08-18 ENCOUNTER — Inpatient Hospital Stay: Admitting: Hematology & Oncology

## 2023-08-18 VITALS — BP 126/63 | HR 84 | Temp 98.0°F | Resp 17

## 2023-08-18 DIAGNOSIS — Z5112 Encounter for antineoplastic immunotherapy: Secondary | ICD-10-CM | POA: Diagnosis not present

## 2023-08-18 DIAGNOSIS — C9000 Multiple myeloma not having achieved remission: Secondary | ICD-10-CM

## 2023-08-18 LAB — CMP (CANCER CENTER ONLY)
ALT: 15 U/L (ref 0–44)
AST: 16 U/L (ref 15–41)
Albumin: 4.6 g/dL (ref 3.5–5.0)
Alkaline Phosphatase: 54 U/L (ref 38–126)
Anion gap: 7 (ref 5–15)
BUN: 15 mg/dL (ref 8–23)
CO2: 25 mmol/L (ref 22–32)
Calcium: 9 mg/dL (ref 8.9–10.3)
Chloride: 107 mmol/L (ref 98–111)
Creatinine: 0.79 mg/dL (ref 0.44–1.00)
GFR, Estimated: >60 mL/min (ref >60)
Glucose, Bld: 137 mg/dL — ABNORMAL HIGH (ref 70–99)
Potassium: 3.6 mmol/L (ref 3.5–5.1)
Sodium: 139 mmol/L (ref 135–145)
Total Bilirubin: 0.8 mg/dL (ref 0.0–1.2)
Total Protein: 6.1 g/dL — ABNORMAL LOW (ref 6.5–8.1)

## 2023-08-18 LAB — CBC WITH DIFFERENTIAL (CANCER CENTER ONLY)
Abs Immature Granulocytes: 0.03 10*3/uL (ref 0.00–0.07)
Basophils Absolute: 0 10*3/uL (ref 0.0–0.1)
Basophils Relative: 1 %
Eosinophils Absolute: 0 10*3/uL (ref 0.0–0.5)
Eosinophils Relative: 1 %
HCT: 28.8 % — ABNORMAL LOW (ref 36.0–46.0)
Hemoglobin: 9.9 g/dL — ABNORMAL LOW (ref 12.0–15.0)
Immature Granulocytes: 1 %
Lymphocytes Relative: 12 %
Lymphs Abs: 0.7 10*3/uL (ref 0.7–4.0)
MCH: 31 pg (ref 26.0–34.0)
MCHC: 34.4 g/dL (ref 30.0–36.0)
MCV: 90.3 fL (ref 80.0–100.0)
Monocytes Absolute: 0.4 10*3/uL (ref 0.1–1.0)
Monocytes Relative: 6 %
Neutro Abs: 4.8 10*3/uL (ref 1.7–7.7)
Neutrophils Relative %: 79 %
Platelet Count: 231 10*3/uL (ref 150–400)
RBC: 3.19 MIL/uL — ABNORMAL LOW (ref 3.87–5.11)
RDW: 15.7 % — ABNORMAL HIGH (ref 11.5–15.5)
WBC Count: 5.9 10*3/uL (ref 4.0–10.5)
nRBC: 0 % (ref 0.0–0.2)

## 2023-08-18 MED ORDER — BORTEZOMIB CHEMO SQ INJECTION 3.5 MG (2.5MG/ML)
1.5000 mg/m2 | Freq: Once | INTRAMUSCULAR | Status: AC
  Administered 2023-08-18: 2.75 mg via SUBCUTANEOUS
  Filled 2023-08-18: qty 1.1

## 2023-08-18 MED ORDER — DIPHENHYDRAMINE HCL 25 MG PO CAPS: 50.0000 mg | ORAL_CAPSULE | Freq: Once | ORAL | Status: DC

## 2023-08-18 MED ORDER — HEPARIN SOD (PORK) LOCK FLUSH 100 UNIT/ML IV SOLN
500.0000 [IU] | Freq: Once | INTRAVENOUS | Status: AC
  Administered 2023-08-18: 500 [IU] via INTRAVENOUS

## 2023-08-18 MED ORDER — SODIUM CHLORIDE 0.9 % IV SOLN
300.0000 mg/m2 | Freq: Once | INTRAVENOUS | Status: AC
  Administered 2023-08-18: 560 mg via INTRAVENOUS
  Filled 2023-08-18: qty 28

## 2023-08-18 MED ORDER — SODIUM CHLORIDE 0.9 % IV SOLN: INTRAVENOUS | Status: DC

## 2023-08-18 MED ORDER — ACETAMINOPHEN 325 MG PO TABS: 650.0000 mg | ORAL_TABLET | Freq: Once | ORAL | Status: DC

## 2023-08-18 MED ORDER — DARATUMUMAB-HYALURONIDASE-FIHJ 1800-30000 MG-UT/15ML ~~LOC~~ SOLN
1800.0000 mg | Freq: Once | SUBCUTANEOUS | Status: AC
  Administered 2023-08-18: 1800 mg via SUBCUTANEOUS
  Filled 2023-08-18: qty 15

## 2023-08-18 MED ORDER — SODIUM CHLORIDE 0.9% FLUSH
10.0000 mL | Freq: Once | INTRAVENOUS | Status: AC
  Administered 2023-08-18: 10 mL via INTRAVENOUS

## 2023-08-18 MED ORDER — DEXAMETHASONE 4 MG PO TABS
20.0000 mg | ORAL_TABLET | Freq: Once | ORAL | Status: DC
Start: 2023-08-18 — End: 2023-08-18

## 2023-08-18 NOTE — Patient Instructions (Signed)

## 2023-08-18 NOTE — Patient Instructions (Signed)
 [facility] - A DEPT OF [facility]  Discharge Instructions: Thank you for choosing [facility] to provide your oncology and hematology care.   If you have a lab appointment with the Cancer Center, please go directly to the Cancer Center and check in at the registration area.  Wear comfortable clothing and clothing appropriate for easy access to any Portacath or PICC line.   We strive to give you quality time with your provider. You may need to reschedule your appointment if you arrive late (15 or more minutes).  Arriving late affects you and other patients whose appointments are after yours.  Also, if you miss three or more appointments without notifying the office, you may be dismissed from the clinic at the provider's discretion.      For prescription refill requests, have your pharmacy contact our office and allow 72 hours for refills to be completed.    Today you received the following chemotherapy and/or immunotherapy agents Velcade , Cytoxan , Daratumumab .      To help prevent nausea and vomiting after your treatment, we encourage you to take your nausea medication as directed.  BELOW ARE SYMPTOMS THAT SHOULD BE REPORTED IMMEDIATELY: *FEVER GREATER THAN 100.4 F (38 C) OR HIGHER *CHILLS OR SWEATING *NAUSEA AND VOMITING THAT IS NOT CONTROLLED WITH YOUR NAUSEA MEDICATION *UNUSUAL SHORTNESS OF BREATH *UNUSUAL BRUISING OR BLEEDING *URINARY PROBLEMS (pain or burning when urinating, or frequent urination) *BOWEL PROBLEMS (unusual diarrhea, constipation, pain near the anus) TENDERNESS IN MOUTH AND THROAT WITH OR WITHOUT PRESENCE OF ULCERS (sore throat, sores in mouth, or a toothache) UNUSUAL RASH, SWELLING OR PAIN  UNUSUAL VAGINAL DISCHARGE OR ITCHING   Items with * indicate a potential emergency and should be followed up as soon as possible or go to the Emergency Department if any problems should occur.  Please show the CHEMOTHERAPY ALERT  CARD or IMMUNOTHERAPY ALERT CARD at check-in to the Emergency Department and triage nurse. Should you have questions after your visit or need to cancel or reschedule your appointment, please contact [facility] - A DEPT OF [facility]  [phone] and follow the prompts.  Office hours are 8:00 a.m. to 4:30 p.m. Monday - Friday. Please note that voicemails left after 4:00 p.m. may not be returned until the following business day.  We are closed weekends and major holidays. You have access to a nurse at all times for urgent questions. Please call the main number to the clinic [phone] and follow the prompts.  For any non-urgent questions, you may also contact your provider using MyChart. We now offer e-Visits for anyone 64 and older to request care online for non-urgent symptoms. For details visit [email or web address].   Also download the MyChart app! Go to the app store, search "MyChart", open the app, select [place], and log in with your MyChart username and password.

## 2023-09-01 ENCOUNTER — Encounter: Payer: Self-pay | Admitting: *Deleted

## 2023-09-01 ENCOUNTER — Encounter: Payer: Self-pay | Admitting: Hematology & Oncology

## 2023-09-01 ENCOUNTER — Inpatient Hospital Stay

## 2023-09-01 ENCOUNTER — Inpatient Hospital Stay: Attending: Hematology & Oncology

## 2023-09-01 ENCOUNTER — Inpatient Hospital Stay (HOSPITAL_BASED_OUTPATIENT_CLINIC_OR_DEPARTMENT_OTHER): Admitting: Hematology & Oncology

## 2023-09-01 VITALS — BP 124/69 | HR 84 | Resp 17

## 2023-09-01 VITALS — BP 140/65 | HR 86 | Temp 98.6°F | Resp 20 | Ht 66.0 in | Wt 155.0 lb

## 2023-09-01 DIAGNOSIS — C9000 Multiple myeloma not having achieved remission: Secondary | ICD-10-CM

## 2023-09-01 DIAGNOSIS — M898X9 Other specified disorders of bone, unspecified site: Secondary | ICD-10-CM

## 2023-09-01 DIAGNOSIS — Z5111 Encounter for antineoplastic chemotherapy: Secondary | ICD-10-CM | POA: Insufficient documentation

## 2023-09-01 DIAGNOSIS — Z5112 Encounter for antineoplastic immunotherapy: Secondary | ICD-10-CM | POA: Diagnosis present

## 2023-09-01 DIAGNOSIS — G629 Polyneuropathy, unspecified: Secondary | ICD-10-CM | POA: Insufficient documentation

## 2023-09-01 LAB — CBC WITH DIFFERENTIAL (CANCER CENTER ONLY)
Abs Immature Granulocytes: 0 10*3/uL (ref 0.00–0.07)
Basophils Absolute: 0 10*3/uL (ref 0.0–0.1)
Basophils Relative: 1 %
Eosinophils Absolute: 0 10*3/uL (ref 0.0–0.5)
Eosinophils Relative: 1 %
HCT: 28.3 % — ABNORMAL LOW (ref 36.0–46.0)
Hemoglobin: 9.5 g/dL — ABNORMAL LOW (ref 12.0–15.0)
Immature Granulocytes: 0 %
Lymphocytes Relative: 16 %
Lymphs Abs: 0.6 10*3/uL — ABNORMAL LOW (ref 0.7–4.0)
MCH: 30.4 pg (ref 26.0–34.0)
MCHC: 33.6 g/dL (ref 30.0–36.0)
MCV: 90.4 fL (ref 80.0–100.0)
Monocytes Absolute: 0.4 10*3/uL (ref 0.1–1.0)
Monocytes Relative: 12 %
Neutro Abs: 2.5 10*3/uL (ref 1.7–7.7)
Neutrophils Relative %: 70 %
Platelet Count: 296 10*3/uL (ref 150–400)
RBC: 3.13 MIL/uL — ABNORMAL LOW (ref 3.87–5.11)
RDW: 16.1 % — ABNORMAL HIGH (ref 11.5–15.5)
WBC Count: 3.5 10*3/uL — ABNORMAL LOW (ref 4.0–10.5)
nRBC: 0 % (ref 0.0–0.2)

## 2023-09-01 LAB — CMP (CANCER CENTER ONLY)
ALT: 14 U/L (ref 0–44)
AST: 13 U/L — ABNORMAL LOW (ref 15–41)
Albumin: 4.4 g/dL (ref 3.5–5.0)
Alkaline Phosphatase: 47 U/L (ref 38–126)
Anion gap: 9 (ref 5–15)
BUN: 16 mg/dL (ref 8–23)
CO2: 24 mmol/L (ref 22–32)
Calcium: 9.3 mg/dL (ref 8.9–10.3)
Chloride: 106 mmol/L (ref 98–111)
Creatinine: 0.99 mg/dL (ref 0.44–1.00)
GFR, Estimated: 58 mL/min — ABNORMAL LOW (ref >60)
Glucose, Bld: 139 mg/dL — ABNORMAL HIGH (ref 70–99)
Potassium: 3.7 mmol/L (ref 3.5–5.1)
Sodium: 139 mmol/L (ref 135–145)
Total Bilirubin: 0.8 mg/dL (ref 0.0–1.2)
Total Protein: 6.5 g/dL (ref 6.5–8.1)

## 2023-09-01 LAB — LACTATE DEHYDROGENASE: LDH: 282 U/L — ABNORMAL HIGH (ref 98–192)

## 2023-09-01 MED ORDER — DEXAMETHASONE 4 MG PO TABS
20.0000 mg | ORAL_TABLET | Freq: Once | ORAL | Status: DC
  Filled 2023-09-01: qty 5

## 2023-09-01 MED ORDER — DEXAMETHASONE 4 MG PO TABS: 12.0000 mg | ORAL_TABLET | ORAL | Status: DC

## 2023-09-01 MED ORDER — DIPHENHYDRAMINE HCL 25 MG PO CAPS
50.0000 mg | ORAL_CAPSULE | Freq: Once | ORAL | Status: DC
  Filled 2023-09-01: qty 2

## 2023-09-01 MED ORDER — HEPARIN SOD (PORK) LOCK FLUSH 100 UNIT/ML IV SOLN
500.0000 [IU] | Freq: Once | INTRAVENOUS | Status: AC | PRN
  Administered 2023-09-01: 500 [IU]

## 2023-09-01 MED ORDER — SODIUM CHLORIDE 0.9 % IV SOLN
300.0000 mg/m2 | Freq: Once | INTRAVENOUS | Status: AC
  Administered 2023-09-01: 560 mg via INTRAVENOUS
  Filled 2023-09-01: qty 28

## 2023-09-01 MED ORDER — SODIUM CHLORIDE 0.9 % IV SOLN: INTRAVENOUS | Status: DC

## 2023-09-01 MED ORDER — BORTEZOMIB CHEMO SQ INJECTION 3.5 MG (2.5MG/ML)
1.5000 mg/m2 | Freq: Once | INTRAMUSCULAR | Status: AC
  Administered 2023-09-01: 2.75 mg via SUBCUTANEOUS
  Filled 2023-09-01: qty 1.1

## 2023-09-01 MED ORDER — ZOLEDRONIC ACID 4 MG/5ML IV CONC
3.5000 mg | Freq: Once | INTRAVENOUS | Status: AC
  Administered 2023-09-01: 3.5 mg via INTRAVENOUS
  Filled 2023-09-01: qty 4.38

## 2023-09-01 MED ORDER — DEXAMETHASONE 4 MG PO TABS: 12.0000 mg | ORAL_TABLET | Freq: Once | ORAL | Status: DC

## 2023-09-01 MED ORDER — ALTEPLASE 2 MG IJ SOLR: 2.0000 mg | Freq: Once | INTRAMUSCULAR | Status: DC | PRN

## 2023-09-01 MED ORDER — ACETAMINOPHEN 325 MG PO TABS
650.0000 mg | ORAL_TABLET | Freq: Once | ORAL | Status: DC
  Filled 2023-09-01: qty 2

## 2023-09-01 MED ORDER — SODIUM CHLORIDE 0.9% FLUSH
10.0000 mL | Freq: Once | INTRAVENOUS | Status: AC | PRN
  Administered 2023-09-01: 10 mL

## 2023-09-01 MED ORDER — DARATUMUMAB-HYALURONIDASE-FIHJ 1800-30000 MG-UT/15ML ~~LOC~~ SOLN
1800.0000 mg | Freq: Once | SUBCUTANEOUS | Status: AC
  Administered 2023-09-01: 1800 mg via SUBCUTANEOUS
  Filled 2023-09-01: qty 15

## 2023-09-01 NOTE — Patient Instructions (Signed)
 [facility] - A DEPT OF [facility]  Discharge Instructions: Thank you for choosing [facility] to provide your oncology and hematology care.   If you have a lab appointment with the Cancer Center, please go directly to the Cancer Center and check in at the registration area.  Wear comfortable clothing and clothing appropriate for easy access to any Portacath or PICC line.   We strive to give you quality time with your provider. You may need to reschedule your appointment if you arrive late (15 or more minutes).  Arriving late affects you and other patients whose appointments are after yours.  Also, if you miss three or more appointments without notifying the office, you may be dismissed from the clinic at the provider's discretion.      For prescription refill requests, have your pharmacy contact our office and allow 72 hours for refills to be completed.    Today you received the following chemotherapy and/or immunotherapy agents Velcade, Darzalex, Cytoxan.      To help prevent nausea and vomiting after your treatment, we encourage you to take your nausea medication as directed.  BELOW ARE SYMPTOMS THAT SHOULD BE REPORTED IMMEDIATELY: *FEVER GREATER THAN 100.4 F (38 C) OR HIGHER *CHILLS OR SWEATING *NAUSEA AND VOMITING THAT IS NOT CONTROLLED WITH YOUR NAUSEA MEDICATION *UNUSUAL SHORTNESS OF BREATH *UNUSUAL BRUISING OR BLEEDING *URINARY PROBLEMS (pain or burning when urinating, or frequent urination) *BOWEL PROBLEMS (unusual diarrhea, constipation, pain near the anus) TENDERNESS IN MOUTH AND THROAT WITH OR WITHOUT PRESENCE OF ULCERS (sore throat, sores in mouth, or a toothache) UNUSUAL RASH, SWELLING OR PAIN  UNUSUAL VAGINAL DISCHARGE OR ITCHING   Items with * indicate a potential emergency and should be followed up as soon as possible or go to the Emergency Department if any problems should occur.  Please show the CHEMOTHERAPY ALERT CARD  or IMMUNOTHERAPY ALERT CARD at check-in to the Emergency Department and triage nurse. Should you have questions after your visit or need to cancel or reschedule your appointment, please contact [facility] - A DEPT OF [facility]  [phone] and follow the prompts.  Office hours are 8:00 a.m. to 4:30 p.m. Monday - Friday. Please note that voicemails left after 4:00 p.m. may not be returned until the following business day.  We are closed weekends and major holidays. You have access to a nurse at all times for urgent questions. Please call the main number to the clinic [phone] and follow the prompts.  For any non-urgent questions, you may also contact your provider using MyChart. We now offer e-Visits for anyone 53 and older to request care online for non-urgent symptoms. For details visit [email or web address].   Also download the MyChart app! Go to the app store, search "MyChart", open the app, select [place], and log in with your MyChart username and password.

## 2023-09-01 NOTE — Progress Notes (Signed)
 Hematology and Oncology Follow Up Visit  Kelly Chase [id number] 21-Mar-1946 78 y.o. 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  She has done incredibly well.  She has had a very nice response to the chemotherapy.  When we last saw her, her monoclonal spike was at a 0.4 g/dL.  The IgG level was down to 750 mg/dL.  The Kappa light chain was down to 0.6 mg/dL.  I think where the point where we can start to pull back on treatment.  We will cut out day #8 of each cycle at this point.  She does not have another PET scan.  I will have to set this up for her.  I also need to have another 24-hour urine to see how everything goes with that.  I want to cut back her Decadron  to 12 mg with each cycle.  I think this would be reasonable.  She is having little bit of a hard time sleeping.  She has had no fever.  She has had no cough.  She has had no nausea or vomiting.  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  Her hemoglobin still has been on the low side.  She does have a relatively low erythropoietin  level.  I think for right now, we can just hold on the ESA.  Overall, I would say that her performance status is probably ECOG 1.     Wt Readings from Last 3 Encounters:  09/01/23 155 lb (70.3 kg)  08/04/23 156 lb 1.9 oz (70.8 kg)  07/16/23 157 lb (71.2 kg)     Medications:  Allergies as of 09/01/2023       Reactions   Celecoxib Nausea And Vomiting   Chlorthalidone Palpitations   Feldene [piroxicam] Other (See Comments)   abd burning   Esomeprazole Other (See Comments)   Joint pain        Medication List        Accurate as of September 01, 2023  9:09 AM. If you have any questions, ask your nurse or  doctor.          acetaminophen  500 MG tablet Commonly known as: TYLENOL  Take 1,000 mg by mouth every 6 (six) hours as needed.   acyclovir  400 MG tablet Commonly known as: ZOVIRAX  Take 1 tablet (400 mg total) by mouth 2 (two) times daily.   amLODipine  10 MG tablet Commonly known as: NORVASC  TAKE 1 TABLET(10 MG) BY MOUTH DAILY   cyclobenzaprine  5 MG tablet Commonly known as: FLEXERIL  Take 1 tablet (5 mg total) by mouth every 8 (eight) hours as needed for muscle spasms.   dexamethasone  4 MG tablet Commonly known as: DECADRON  Take 5 tablets (20 mg total) by mouth See admin instructions. Take 5 tablets the morning of your chemotherapy appointment before arriving to the clinic   gabapentin  100 MG capsule Commonly known as: NEURONTIN  Take by mouth at bedtime as needed.   lidocaine -prilocaine  cream Commonly known as: EMLA  Apply 1 Application topically as needed.   lisinopril  20 MG tablet Commonly known as: ZESTRIL  Take 1 tablet (20 mg total) by mouth daily.   ondansetron  4 MG disintegrating tablet Commonly known as: ZOFRAN -ODT Take 1 tablet (  4 mg total) by mouth every 8 (eight) hours as needed for nausea or vomiting.   ondansetron  8 MG tablet Commonly known as: Zofran  Take 8 mg by mouth 30 to 60 min prior to Cyclophosphamide  administration then take 8 mg every 8 hrs as needed for nausea and vomiting.   prochlorperazine  10 MG tablet Commonly known as: COMPAZINE  Take 1 tablet (10 mg total) by mouth every 6 (six) hours as needed for nausea or vomiting.   traZODone  50 MG tablet Commonly known as: DESYREL  TAKE 1 TABLET BY MOUTH EVERYDAY AT BEDTIME        Allergies:  Allergies  Allergen Reactions   Celecoxib Nausea And Vomiting   Chlorthalidone Palpitations   Feldene [Piroxicam] Other (See Comments)    abd burning   Esomeprazole Other (See Comments)    Joint pain    Past Medical History, Surgical history, Social history, and Family History were reviewed and  updated.  Review of Systems: Review of Systems  Constitutional: Negative.   HENT: Negative.    Eyes: Negative.   Respiratory: Negative.    Cardiovascular: Negative.   Gastrointestinal: Negative.   Genitourinary: Negative.   Musculoskeletal: Negative.   Skin: Negative.   Neurological: Negative.   Endo/Heme/Allergies: Negative.   Psychiatric/Behavioral: Negative.        Physical Exam:  height is 5' 6 (1.676 m) and weight is 155 lb (70.3 kg). Her oral temperature is 98.6 F (37 C). Her blood pressure is 140/65 (abnormal) and her pulse is 86. Her respiration is 20 and oxygen saturation is 100%.   Wt Readings from Last 3 Encounters:  09/01/23 155 lb (70.3 kg)  08/04/23 156 lb 1.9 oz (70.8 kg)  07/16/23 157 lb (71.2 kg)    Physical Exam Vitals reviewed.  HENT:     Head: Normocephalic and atraumatic.   Eyes:     Pupils: Pupils are equal, round, and reactive to light.    Cardiovascular:     Rate and Rhythm: Normal rate and regular rhythm.     Heart sounds: Normal heart sounds.  Pulmonary:     Effort: Pulmonary effort is normal.     Breath sounds: Normal breath sounds.  Abdominal:     General: Bowel sounds are normal.     Palpations: Abdomen is soft.   Musculoskeletal:        General: No swelling, tenderness or deformity. Normal range of motion.     Cervical back: Normal range of motion.     Left lower leg: No edema.     Comments: Non tender to palpation. No erythema, edema, or palpable bony abnormalities   Lymphadenopathy:     Cervical: No cervical adenopathy.   Skin:    General: Skin is warm and dry.     Findings: No erythema or rash.   Neurological:     Mental Status: She is alert and oriented to person, place, and time.   Psychiatric:        Behavior: Behavior normal.        Thought Content: Thought content normal.        Judgment: Judgment normal.      Lab Results  Component Value Date   WBC 3.5 (L) 09/01/2023   HGB 9.5 (L) 09/01/2023   HCT 28.3  (L) 09/01/2023   MCV 90.4 09/01/2023   PLT 296 09/01/2023   Lab Results  Component Value Date   FERRITIN 732 (H) 07/07/2023   IRON 49 07/07/2023   TIBC 342 07/07/2023  UIBC 293 07/07/2023   IRONPCTSAT 14 07/07/2023   Lab Results  Component Value Date   RETICCTPCT 2.1 07/07/2023   RBC 3.13 (L) 09/01/2023   Lab Results  Component Value Date   KPAFRELGTCHN 6.2 08/04/2023   LAMBDASER 3.2 (L) 08/04/2023   KAPLAMBRATIO 1.94 (H) 08/04/2023   Lab Results  Component Value Date   IGGSERUM 750 08/04/2023   IGA 29 (L) 08/04/2023   IGMSERUM 16 (L) 08/04/2023   Lab Results  Component Value Date   TOTALPROTELP 6.2 08/04/2023   ALBUMINELP 3.8 07/07/2023   A1GS 0.3 07/07/2023   A2GS 0.5 07/07/2023   BETS 1.0 07/07/2023   BETA2SER 0.5 09/24/2014   GAMS 0.8 07/07/2023   MSPIKE 0.5 (H) 07/07/2023   SPEI Comment 06/09/2023     Chemistry      Component Value Date/Time   NA 139 08/18/2023 0935   NA 139 10/10/2015 0938   K 3.6 08/18/2023 0935   K 4.2 10/10/2015 0938   CL 107 08/18/2023 0935   CL 105 04/11/2015 0953   CO2 25 08/18/2023 0935   CO2 25 10/10/2015 0938   BUN 15 08/18/2023 0935   BUN 13.6 10/10/2015 0938   CREATININE 0.79 08/18/2023 0935   CREATININE 1.0 10/10/2015 0938      Component Value Date/Time   CALCIUM 9.0 08/18/2023 0935   CALCIUM 9.7 10/10/2015 0938   ALKPHOS 54 08/18/2023 0935   ALKPHOS 104 10/10/2015 0938   AST 16 08/18/2023 0935   AST 18 10/10/2015 0938   ALT 15 08/18/2023 0935   ALT 20 10/10/2015 0938   BILITOT 0.8 08/18/2023 0935   BILITOT 0.80 10/10/2015 0938     Impression and Plan: Kelly Chase is a pleasant 78 yo African American female with progressive IgG kappa myeloma.   Again, she has responded very nicely to chemotherapy.  We will now pull back a little bit.  Hopefully this will make life little bit easier for her.  We will get her Zometa  today..  She does need to have a PET scan.  She also is having 24-hour urine.  Will get both of  these set up..  I am just happy that she has responded well.  Hopefully, we will be able to get her on a maintenance protocol in the future.  I will plan to see her back in another month.     6/12/20259:09 AM

## 2023-09-01 NOTE — Patient Instructions (Signed)

## 2023-09-01 NOTE — Progress Notes (Signed)
 Patient is doing very well with treatment. She's shown a good response. Her regimen will now be modified. Appointment for next week cancelled. She will need a PET. Scheduled for 09/16/2023.Kelly Chase  Patient is aware of PET appointment including date, time, and location. The following prep is reviewed with patient and confirmed with teachback: - arrive 30 minutes before appointment time - NPO except water for 6h before scan. No candy, no gum - hold any diabetic medication the morning of the scan - have a low carb dinner the night prior  Radiology Information sheet also reviewed and given to patient for reinforcement of education.  Oncology Nurse Navigator Documentation     09/01/2023    8:45 AM  Oncology Nurse Navigator Flowsheets  Navigator Follow Up Date: 09/16/2023  Navigator Follow Up Reason: Scan Review  Navigator Location [facility]  Navigator Encounter Type Treatment  Patient Visit Type MedOnc  Treatment Phase Active Tx  Barriers/Navigation Needs No Barriers At This Time  Education Other  Interventions Coordination of Care;Education  Acuity Level 1-No Barriers  Coordination of Care Radiology  Education Method Verbal;Written  Support Groups/Services Friends and Family  Time Spent with Patient 30

## 2023-09-02 ENCOUNTER — Other Ambulatory Visit: Payer: Self-pay

## 2023-09-02 LAB — KAPPA/LAMBDA LIGHT CHAINS
Kappa free light chain: 6 mg/L (ref 3.3–19.4)
Kappa, lambda light chain ratio: 1.88 — ABNORMAL HIGH (ref 0.26–1.65)
Lambda free light chains: 3.2 mg/L — ABNORMAL LOW (ref 5.7–26.3)

## 2023-09-04 LAB — MULTIPLE MYELOMA PANEL, SERUM
Albumin SerPl Elph-Mcnc: 3.7 g/dL (ref 2.9–4.4)
Albumin/Glob SerPl: 1.5 (ref 0.7–1.7)
Alpha 1: 0.3 g/dL (ref 0.0–0.4)
Alpha2 Glob SerPl Elph-Mcnc: 0.5 g/dL (ref 0.4–1.0)
B-Globulin SerPl Elph-Mcnc: 1 g/dL (ref 0.7–1.3)
Gamma Glob SerPl Elph-Mcnc: 0.7 g/dL (ref 0.4–1.8)
Globulin, Total: 2.5 g/dL (ref 2.2–3.9)
IgA: 25 mg/dL — ABNORMAL LOW (ref 64–422)
IgG (Immunoglobin G), Serum: 691 mg/dL (ref 586–1602)
IgM (Immunoglobulin M), Srm: 15 mg/dL — ABNORMAL LOW (ref 26–217)
M Protein SerPl Elph-Mcnc: 0.3 g/dL — ABNORMAL HIGH
Total Protein ELP: 6.2 g/dL (ref 6.0–8.5)

## 2023-09-05 ENCOUNTER — Ambulatory Visit: Payer: Self-pay | Admitting: Hematology & Oncology

## 2023-09-08 ENCOUNTER — Ambulatory Visit

## 2023-09-08 ENCOUNTER — Inpatient Hospital Stay

## 2023-09-08 ENCOUNTER — Other Ambulatory Visit

## 2023-09-15 ENCOUNTER — Inpatient Hospital Stay

## 2023-09-15 VITALS — BP 117/54 | HR 85

## 2023-09-15 DIAGNOSIS — Z5112 Encounter for antineoplastic immunotherapy: Secondary | ICD-10-CM | POA: Diagnosis not present

## 2023-09-15 DIAGNOSIS — C9000 Multiple myeloma not having achieved remission: Secondary | ICD-10-CM

## 2023-09-15 LAB — CBC WITH DIFFERENTIAL (CANCER CENTER ONLY)
Abs Immature Granulocytes: 0.02 10*3/uL (ref 0.00–0.07)
Basophils Absolute: 0 10*3/uL (ref 0.0–0.1)
Basophils Relative: 1 %
Eosinophils Absolute: 0 10*3/uL (ref 0.0–0.5)
Eosinophils Relative: 0 %
HCT: 30.1 % — ABNORMAL LOW (ref 36.0–46.0)
Hemoglobin: 10.3 g/dL — ABNORMAL LOW (ref 12.0–15.0)
Immature Granulocytes: 0 %
Lymphocytes Relative: 9 %
Lymphs Abs: 0.5 10*3/uL — ABNORMAL LOW (ref 0.7–4.0)
MCH: 30 pg (ref 26.0–34.0)
MCHC: 34.2 g/dL (ref 30.0–36.0)
MCV: 87.8 fL (ref 80.0–100.0)
Monocytes Absolute: 0.3 10*3/uL (ref 0.1–1.0)
Monocytes Relative: 5 %
Neutro Abs: 4.9 10*3/uL (ref 1.7–7.7)
Neutrophils Relative %: 85 %
Platelet Count: 307 10*3/uL (ref 150–400)
RBC: 3.43 MIL/uL — ABNORMAL LOW (ref 3.87–5.11)
RDW: 15.4 % (ref 11.5–15.5)
WBC Count: 5.8 10*3/uL (ref 4.0–10.5)
nRBC: 0 % (ref 0.0–0.2)

## 2023-09-15 LAB — CMP (CANCER CENTER ONLY)
ALT: 9 U/L (ref 0–44)
AST: 10 U/L — ABNORMAL LOW (ref 15–41)
Albumin: 4.5 g/dL (ref 3.5–5.0)
Alkaline Phosphatase: 47 U/L (ref 38–126)
Anion gap: 8 (ref 5–15)
BUN: 19 mg/dL (ref 8–23)
CO2: 25 mmol/L (ref 22–32)
Calcium: 9.5 mg/dL (ref 8.9–10.3)
Chloride: 106 mmol/L (ref 98–111)
Creatinine: 1.01 mg/dL — ABNORMAL HIGH (ref 0.44–1.00)
GFR, Estimated: 57 mL/min — ABNORMAL LOW (ref >60)
Glucose, Bld: 114 mg/dL — ABNORMAL HIGH (ref 70–99)
Potassium: 4 mmol/L (ref 3.5–5.1)
Sodium: 139 mmol/L (ref 135–145)
Total Bilirubin: 0.8 mg/dL (ref 0.0–1.2)
Total Protein: 6.3 g/dL — ABNORMAL LOW (ref 6.5–8.1)

## 2023-09-15 MED ORDER — ACETAMINOPHEN 325 MG PO TABS: 650.0000 mg | ORAL_TABLET | Freq: Once | ORAL | Status: DC

## 2023-09-15 MED ORDER — SODIUM CHLORIDE 0.9 % IV SOLN
300.0000 mg/m2 | Freq: Once | INTRAVENOUS | Status: AC
  Administered 2023-09-15: 560 mg via INTRAVENOUS
  Filled 2023-09-15: qty 28

## 2023-09-15 MED ORDER — HEPARIN SOD (PORK) LOCK FLUSH 100 UNIT/ML IV SOLN
500.0000 [IU] | Freq: Once | INTRAVENOUS | Status: AC
  Administered 2023-09-15: 500 [IU] via INTRAVENOUS

## 2023-09-15 MED ORDER — DEXAMETHASONE 4 MG PO TABS
12.0000 mg | ORAL_TABLET | Freq: Once | ORAL | Status: DC
Start: 2023-09-15 — End: 2023-09-15

## 2023-09-15 MED ORDER — DIPHENHYDRAMINE HCL 25 MG PO CAPS: 50.0000 mg | ORAL_CAPSULE | Freq: Once | ORAL | Status: DC

## 2023-09-15 MED ORDER — SODIUM CHLORIDE 0.9 % IV SOLN: Freq: Once | INTRAVENOUS | Status: AC

## 2023-09-15 MED ORDER — SODIUM CHLORIDE 0.9% FLUSH
10.0000 mL | Freq: Once | INTRAVENOUS | Status: AC
  Administered 2023-09-15: 10 mL

## 2023-09-15 MED ORDER — BORTEZOMIB CHEMO SQ INJECTION 3.5 MG (2.5MG/ML)
1.5000 mg/m2 | Freq: Once | INTRAMUSCULAR | Status: AC
  Administered 2023-09-15: 2.75 mg via SUBCUTANEOUS
  Filled 2023-09-15: qty 1.1

## 2023-09-15 MED ORDER — DARATUMUMAB-HYALURONIDASE-FIHJ 1800-30000 MG-UT/15ML ~~LOC~~ SOLN
1800.0000 mg | Freq: Once | SUBCUTANEOUS | Status: AC
  Administered 2023-09-15: 1800 mg via SUBCUTANEOUS
  Filled 2023-09-15: qty 15

## 2023-09-15 MED ORDER — GENTAMICIN SULFATE 0.1 % EX OINT: 1.0000 | TOPICAL_OINTMENT | Freq: Three times a day (TID) | CUTANEOUS | 0 refills | Status: AC

## 2023-09-15 NOTE — Progress Notes (Signed)
 Patient complains of stye on her lower left eye. Dr. Timmy notified. Order given for Gentamicin ointment. Patient verbalized understanding.

## 2023-09-15 NOTE — Patient Instructions (Signed)

## 2023-09-15 NOTE — Patient Instructions (Signed)
 [facility] - A DEPT OF [facility]  Discharge Instructions: Thank you for choosing [facility] to provide your oncology and hematology care.   If you have a lab appointment with the Cancer Center, please go directly to the Cancer Center and check in at the registration area.  Wear comfortable clothing and clothing appropriate for easy access to any Portacath or PICC line.   We strive to give you quality time with your provider. You may need to reschedule your appointment if you arrive late (15 or more minutes).  Arriving late affects you and other patients whose appointments are after yours.  Also, if you miss three or more appointments without notifying the office, you may be dismissed from the clinic at the provider's discretion.      For prescription refill requests, have your pharmacy contact our office and allow 72 hours for refills to be completed.    Today you received the following chemotherapy and/or immunotherapy agents Cytoxan/Velcade/Darzalex      To help prevent nausea and vomiting after your treatment, we encourage you to take your nausea medication as directed.  BELOW ARE SYMPTOMS THAT SHOULD BE REPORTED IMMEDIATELY: *FEVER GREATER THAN 100.4 F (38 C) OR HIGHER *CHILLS OR SWEATING *NAUSEA AND VOMITING THAT IS NOT CONTROLLED WITH YOUR NAUSEA MEDICATION *UNUSUAL SHORTNESS OF BREATH *UNUSUAL BRUISING OR BLEEDING *URINARY PROBLEMS (pain or burning when urinating, or frequent urination) *BOWEL PROBLEMS (unusual diarrhea, constipation, pain near the anus) TENDERNESS IN MOUTH AND THROAT WITH OR WITHOUT PRESENCE OF ULCERS (sore throat, sores in mouth, or a toothache) UNUSUAL RASH, SWELLING OR PAIN  UNUSUAL VAGINAL DISCHARGE OR ITCHING   Items with * indicate a potential emergency and should be followed up as soon as possible or go to the Emergency Department if any problems should occur.  Please show the CHEMOTHERAPY ALERT CARD or  IMMUNOTHERAPY ALERT CARD at check-in to the Emergency Department and triage nurse. Should you have questions after your visit or need to cancel or reschedule your appointment, please contact [facility] - A DEPT OF [facility]  [phone] and follow the prompts.  Office hours are 8:00 a.m. to 4:30 p.m. Monday - Friday. Please note that voicemails left after 4:00 p.m. may not be returned until the following business day.  We are closed weekends and major holidays. You have access to a nurse at all times for urgent questions. Please call the main number to the clinic [phone] and follow the prompts.  For any non-urgent questions, you may also contact your provider using MyChart. We now offer e-Visits for anyone 4 and older to request care online for non-urgent symptoms. For details visit [email or web address].   Also download the MyChart app! Go to the app store, search "MyChart", open the app, select [place], and log in with your MyChart username and password.

## 2023-09-16 ENCOUNTER — Encounter (HOSPITAL_COMMUNITY)
Admission: RE | Admit: 2023-09-16 | Discharge: 2023-09-16 | Disposition: A | Discharge status: Home / Self Care | Source: Ambulatory Visit | Attending: Hematology & Oncology | Admitting: Hematology & Oncology

## 2023-09-16 ENCOUNTER — Other Ambulatory Visit (HOSPITAL_COMMUNITY)

## 2023-09-16 DIAGNOSIS — C9000 Multiple myeloma not having achieved remission: Secondary | ICD-10-CM | POA: Insufficient documentation

## 2023-09-16 LAB — GLUCOSE, CAPILLARY: Glucose-Capillary: 103 mg/dL — ABNORMAL HIGH (ref 70–99)

## 2023-09-16 MED ORDER — FLUDEOXYGLUCOSE F - 18 (FDG) INJECTION
8.0000 | Freq: Once | INTRAVENOUS | Status: AC
Start: 2023-09-16 — End: 2023-09-16
  Administered 2023-09-16: 8.328 via INTRAVENOUS

## 2023-09-19 ENCOUNTER — Ambulatory Visit: Payer: Self-pay | Admitting: Hematology & Oncology

## 2023-09-19 ENCOUNTER — Encounter: Payer: Self-pay | Admitting: *Deleted

## 2023-09-19 NOTE — Telephone Encounter (Signed)
-----   Message from Maude JONELLE Crease sent at 09/19/2023  1:11 PM EDT ----- Please call and let her know that the PET scan looks a whole lot better.  No surprise, treatment is working very well.  Jeralyn ----- Message ----- From: Rebecka, Rad Results In Sent: 09/19/2023  12:17 PM EDT To: Maude JONELLE Crease, MD

## 2023-09-19 NOTE — Progress Notes (Signed)
 Reviewed PET which shows good treatment response.  Oncology Nurse Navigator Documentation     09/19/2023   12:30 PM  Oncology Nurse Navigator Flowsheets  Navigator Follow Up Date: 10/06/2023  Navigator Follow Up Reason: Follow-up Appointment;Chemotherapy  Navigator Location [facility]  Navigator Encounter Type Scan Review  Patient Visit Type MedOnc  Treatment Phase Active Tx  Barriers/Navigation Needs No Barriers At This Time  Interventions None Required  Acuity Level 1-No Barriers  Support Groups/Services Friends and Family  Time Spent with Patient 15

## 2023-09-19 NOTE — Telephone Encounter (Signed)
 Advised via MyChart.

## 2023-10-06 ENCOUNTER — Inpatient Hospital Stay

## 2023-10-06 ENCOUNTER — Ambulatory Visit (HOSPITAL_BASED_OUTPATIENT_CLINIC_OR_DEPARTMENT_OTHER)
Admission: RE | Admit: 2023-10-06 | Discharge: 2023-10-06 | Disposition: A | Discharge status: Home / Self Care | Source: Ambulatory Visit | Attending: Hematology & Oncology | Admitting: Hematology & Oncology

## 2023-10-06 ENCOUNTER — Encounter: Payer: Self-pay | Admitting: Hematology & Oncology

## 2023-10-06 ENCOUNTER — Inpatient Hospital Stay: Attending: Hematology & Oncology

## 2023-10-06 ENCOUNTER — Encounter: Payer: Self-pay | Admitting: *Deleted

## 2023-10-06 ENCOUNTER — Inpatient Hospital Stay: Admitting: Hematology & Oncology

## 2023-10-06 ENCOUNTER — Other Ambulatory Visit: Payer: Self-pay

## 2023-10-06 VITALS — BP 126/80 | HR 89 | Temp 97.7°F | Resp 18 | Ht 66.0 in | Wt 147.0 lb

## 2023-10-06 VITALS — BP 116/62 | HR 84 | Resp 17

## 2023-10-06 DIAGNOSIS — C9000 Multiple myeloma not having achieved remission: Secondary | ICD-10-CM | POA: Insufficient documentation

## 2023-10-06 DIAGNOSIS — Z5111 Encounter for antineoplastic chemotherapy: Secondary | ICD-10-CM | POA: Diagnosis present

## 2023-10-06 DIAGNOSIS — Z5112 Encounter for antineoplastic immunotherapy: Secondary | ICD-10-CM | POA: Diagnosis present

## 2023-10-06 DIAGNOSIS — M79672 Pain in left foot: Secondary | ICD-10-CM | POA: Insufficient documentation

## 2023-10-06 LAB — CBC WITH DIFFERENTIAL (CANCER CENTER ONLY)
Abs Immature Granulocytes: 0.01 K/uL (ref 0.00–0.07)
Basophils Absolute: 0 K/uL (ref 0.0–0.1)
Basophils Relative: 1 %
Eosinophils Absolute: 0 K/uL (ref 0.0–0.5)
Eosinophils Relative: 1 %
HCT: 30.6 % — ABNORMAL LOW (ref 36.0–46.0)
Hemoglobin: 10.3 g/dL — ABNORMAL LOW (ref 12.0–15.0)
Immature Granulocytes: 0 %
Lymphocytes Relative: 8 %
Lymphs Abs: 0.3 K/uL — ABNORMAL LOW (ref 0.7–4.0)
MCH: 29.3 pg (ref 26.0–34.0)
MCHC: 33.7 g/dL (ref 30.0–36.0)
MCV: 86.9 fL (ref 80.0–100.0)
Monocytes Absolute: 0.2 K/uL (ref 0.1–1.0)
Monocytes Relative: 4 %
Neutro Abs: 3.8 K/uL (ref 1.7–7.7)
Neutrophils Relative %: 86 %
Platelet Count: 289 K/uL (ref 150–400)
RBC: 3.52 MIL/uL — ABNORMAL LOW (ref 3.87–5.11)
RDW: 15.9 % — ABNORMAL HIGH (ref 11.5–15.5)
WBC Count: 4.4 K/uL (ref 4.0–10.5)
nRBC: 0 % (ref 0.0–0.2)

## 2023-10-06 LAB — CMP (CANCER CENTER ONLY)
ALT: 11 U/L (ref 0–44)
AST: 13 U/L — ABNORMAL LOW (ref 15–41)
Albumin: 4.8 g/dL (ref 3.5–5.0)
Alkaline Phosphatase: 47 U/L (ref 38–126)
Anion gap: 7 (ref 5–15)
BUN: 15 mg/dL (ref 8–23)
CO2: 26 mmol/L (ref 22–32)
Calcium: 9.8 mg/dL (ref 8.9–10.3)
Chloride: 105 mmol/L (ref 98–111)
Creatinine: 0.95 mg/dL (ref 0.44–1.00)
GFR, Estimated: >60 mL/min (ref >60)
Glucose, Bld: 138 mg/dL — ABNORMAL HIGH (ref 70–99)
Potassium: 3.9 mmol/L (ref 3.5–5.1)
Sodium: 138 mmol/L (ref 135–145)
Total Bilirubin: 0.8 mg/dL (ref 0.0–1.2)
Total Protein: 6.7 g/dL (ref 6.5–8.1)

## 2023-10-06 LAB — LACTATE DEHYDROGENASE: LDH: 251 U/L — ABNORMAL HIGH (ref 98–192)

## 2023-10-06 MED ORDER — DARATUMUMAB-HYALURONIDASE-FIHJ 1800-30000 MG-UT/15ML ~~LOC~~ SOLN
1800.0000 mg | Freq: Once | SUBCUTANEOUS | Status: AC
  Administered 2023-10-06: 1800 mg via SUBCUTANEOUS
  Filled 2023-10-06: qty 15

## 2023-10-06 MED ORDER — SODIUM CHLORIDE 0.9 % IV SOLN
300.0000 mg/m2 | Freq: Once | INTRAVENOUS | Status: AC
  Administered 2023-10-06: 560 mg via INTRAVENOUS
  Filled 2023-10-06: qty 28

## 2023-10-06 MED ORDER — BORTEZOMIB CHEMO SQ INJECTION 3.5 MG (2.5MG/ML)
1.5000 mg/m2 | Freq: Once | INTRAMUSCULAR | Status: AC
  Administered 2023-10-06: 2.75 mg via SUBCUTANEOUS
  Filled 2023-10-06: qty 1.1

## 2023-10-06 MED ORDER — ACETAMINOPHEN 325 MG PO TABS
650.0000 mg | ORAL_TABLET | Freq: Once | ORAL | Status: DC
Start: 2023-10-06 — End: 2023-10-06

## 2023-10-06 MED ORDER — DEXAMETHASONE 4 MG PO TABS: 12.0000 mg | ORAL_TABLET | Freq: Once | ORAL | Status: DC

## 2023-10-06 MED ORDER — SODIUM CHLORIDE 0.9% FLUSH
10.0000 mL | INTRAVENOUS | Status: DC | PRN
  Administered 2023-10-06: 10 mL via INTRAVENOUS

## 2023-10-06 MED ORDER — SODIUM CHLORIDE 0.9 % IV SOLN: INTRAVENOUS | Status: DC

## 2023-10-06 MED ORDER — HEPARIN SOD (PORK) LOCK FLUSH 100 UNIT/ML IV SOLN
500.0000 [IU] | Freq: Once | INTRAVENOUS | Status: AC
  Administered 2023-10-06: 500 [IU] via INTRAVENOUS

## 2023-10-06 MED ORDER — DIPHENHYDRAMINE HCL 25 MG PO CAPS: 50.0000 mg | ORAL_CAPSULE | Freq: Once | ORAL | Status: DC

## 2023-10-06 NOTE — Patient Instructions (Signed)
 [facility] - A DEPT OF [facility]  Discharge Instructions: Thank you for choosing [facility] to provide your oncology and hematology care.   If you have a lab appointment with the Cancer Center, please go directly to the Cancer Center and check in at the registration area.  Wear comfortable clothing and clothing appropriate for easy access to any Portacath or PICC line.   We strive to give you quality time with your provider. You may need to reschedule your appointment if you arrive late (15 or more minutes).  Arriving late affects you and other patients whose appointments are after yours.  Also, if you miss three or more appointments without notifying the office, you may be dismissed from the clinic at the provider's discretion.      For prescription refill requests, have your pharmacy contact our office and allow 72 hours for refills to be completed.    Today you received the following chemotherapy and/or immunotherapy agents:  Cytoxan , Velcade  and Faspro      To help prevent nausea and vomiting after your treatment, we encourage you to take your nausea medication as directed.  BELOW ARE SYMPTOMS THAT SHOULD BE REPORTED IMMEDIATELY: *FEVER GREATER THAN 100.4 F (38 C) OR HIGHER *CHILLS OR SWEATING *NAUSEA AND VOMITING THAT IS NOT CONTROLLED WITH YOUR NAUSEA MEDICATION *UNUSUAL SHORTNESS OF BREATH *UNUSUAL BRUISING OR BLEEDING *URINARY PROBLEMS (pain or burning when urinating, or frequent urination) *BOWEL PROBLEMS (unusual diarrhea, constipation, pain near the anus) TENDERNESS IN MOUTH AND THROAT WITH OR WITHOUT PRESENCE OF ULCERS (sore throat, sores in mouth, or a toothache) UNUSUAL RASH, SWELLING OR PAIN  UNUSUAL VAGINAL DISCHARGE OR ITCHING   Items with * indicate a potential emergency and should be followed up as soon as possible or go to the Emergency Department if any problems should occur.  Please show the CHEMOTHERAPY ALERT CARD  or IMMUNOTHERAPY ALERT CARD at check-in to the Emergency Department and triage nurse. Should you have questions after your visit or need to cancel or reschedule your appointment, please contact [facility] - A DEPT OF [facility]  [phone] and follow the prompts.  Office hours are 8:00 a.m. to 4:30 p.m. Monday - Friday. Please note that voicemails left after 4:00 p.m. may not be returned until the following business day.  We are closed weekends and major holidays. You have access to a nurse at all times for urgent questions. Please call the main number to the clinic [phone] and follow the prompts.  For any non-urgent questions, you may also contact your provider using MyChart. We now offer e-Visits for anyone 5 and older to request care online for non-urgent symptoms. For details visit [email or web address].   Also download the MyChart app! Go to the app store, search MyChart, open the app, select [place], and log in with your MyChart username and password.

## 2023-10-06 NOTE — Progress Notes (Signed)
 Hematology and Oncology Follow Up Visit  Kelly Chase [id number] 1946/03/06 78 y.o. 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 Kelly Chase is here today for follow-up.  She has done incredibly well.  She has had a very nice response to the chemotherapy.  When we last saw her, her monoclonal spike was at a 0.3 g/dL.  The IgG level was down to 691 mg/dL.  The Kappa light chain was down to 0.6 mg/dL.  We now have her on every 2-week treatment.  I would like to hope that this will continue to help her out.  We did do a PET scan on her.  This was done on 09/16/2023.  The PET scan showed marked improvement in her bony disease disease.  Her main problem has been pain in the left foot.  I really do not think this is myeloma.  We will going get some plain films.  I told her that she really needs to see an orthopedist or a podiatrist for this.  She has a good appetite.  She has had no nausea or vomiting.  She has had no bleeding.  There is been no change in bowel or bladder habits.  I am going to have to do another 24-hour urine on her.  We will have to see how that looks.  I would have to believe this is a whole lot better.  Currently, I would have to say that her performance status is probably ECOG 2.   Wt Readings from Last 3 Encounters:  10/06/23 147 lb (66.7 kg)  09/01/23 155 lb (70.3 kg)  08/04/23 156 lb 1.9 oz (70.8 kg)     Medications:  Allergies as of 10/06/2023       Reactions   Celecoxib Nausea And Vomiting   Chlorthalidone Palpitations   Feldene [piroxicam] Other (See Comments)   abd burning   Esomeprazole Other (See Comments)   Joint pain        Medication List        Accurate as of October 06, 2023  9:37 AM. If you have any  questions, ask your nurse or doctor.          acetaminophen  500 MG tablet Commonly known as: TYLENOL  Take 1,000 mg by mouth every 6 (six) hours as needed.   acyclovir  400 MG tablet Commonly known as: ZOVIRAX  Take 1 tablet (400 mg total) by mouth 2 (two) times daily.   amLODipine  10 MG tablet Commonly known as: NORVASC  TAKE 1 TABLET(10 MG) BY MOUTH DAILY   cyclobenzaprine  5 MG tablet Commonly known as: FLEXERIL  Take 1 tablet (5 mg total) by mouth every 8 (eight) hours as needed for muscle spasms.   dexamethasone  4 MG tablet Commonly known as: DECADRON  Take 3 tablets (12 mg total) by mouth See admin instructions. Take 5 tablets the morning of your chemotherapy appointment before arriving to the clinic   gabapentin  100 MG capsule Commonly known as: NEURONTIN  Take by mouth at bedtime as needed.   lidocaine -prilocaine  cream Commonly known as: EMLA  Apply 1 Application topically as needed.   lisinopril  20 MG tablet Commonly known as: ZESTRIL  Take 1 tablet (20 mg total) by mouth daily.   ondansetron  4 MG disintegrating tablet Commonly known as:  ZOFRAN -ODT Take 1 tablet (4 mg total) by mouth every 8 (eight) hours as needed for nausea or vomiting.   ondansetron  8 MG tablet Commonly known as: Zofran  Take 8 mg by mouth 30 to 60 min prior to Cyclophosphamide  administration then take 8 mg every 8 hrs as needed for nausea and vomiting.   prochlorperazine  10 MG tablet Commonly known as: COMPAZINE  Take 1 tablet (10 mg total) by mouth every 6 (six) hours as needed for nausea or vomiting.   traZODone  50 MG tablet Commonly known as: DESYREL  TAKE 1 TABLET BY MOUTH EVERYDAY AT BEDTIME        Allergies:  Allergies  Allergen Reactions   Celecoxib Nausea And Vomiting   Chlorthalidone Palpitations   Feldene [Piroxicam] Other (See Comments)    abd burning   Esomeprazole Other (See Comments)    Joint pain    Past Medical History, Surgical history, Social history, and Family  History were reviewed and updated.  Review of Systems: Review of Systems  Constitutional: Negative.   HENT: Negative.    Eyes: Negative.   Respiratory: Negative.    Cardiovascular: Negative.   Gastrointestinal: Negative.   Genitourinary: Negative.   Musculoskeletal: Negative.   Skin: Negative.   Neurological: Negative.   Endo/Heme/Allergies: Negative.   Psychiatric/Behavioral: Negative.        Physical Exam:  height is 5' 6 (1.676 m) and weight is 147 lb (66.7 kg). Her blood pressure is 126/80.   Wt Readings from Last 3 Encounters:  10/06/23 147 lb (66.7 kg)  09/01/23 155 lb (70.3 kg)  08/04/23 156 lb 1.9 oz (70.8 kg)    Physical Exam Vitals reviewed.  HENT:     Head: Normocephalic and atraumatic.  Eyes:     Pupils: Pupils are equal, round, and reactive to light.  Cardiovascular:     Rate and Rhythm: Normal rate and regular rhythm.     Heart sounds: Normal heart sounds.  Pulmonary:     Effort: Pulmonary effort is normal.     Breath sounds: Normal breath sounds.  Abdominal:     General: Bowel sounds are normal.     Palpations: Abdomen is soft.  Musculoskeletal:        General: No swelling, tenderness or deformity. Normal range of motion.     Cervical back: Normal range of motion.     Left lower leg: No edema.     Comments: Non tender to palpation. No erythema, edema, or palpable bony abnormalities   Lymphadenopathy:     Cervical: No cervical adenopathy.  Skin:    General: Skin is warm and dry.     Findings: No erythema or rash.  Neurological:     Mental Status: She is alert and oriented to person, place, and time.  Psychiatric:        Behavior: Behavior normal.        Thought Content: Thought content normal.        Judgment: Judgment normal.      Lab Results  Component Value Date   WBC 5.8 09/15/2023   HGB 10.3 (L) 09/15/2023   HCT 30.1 (L) 09/15/2023   MCV 87.8 09/15/2023   PLT 307 09/15/2023   Lab Results  Component Value Date   FERRITIN 732  (H) 07/07/2023   IRON 49 07/07/2023   TIBC 342 07/07/2023   UIBC 293 07/07/2023   IRONPCTSAT 14 07/07/2023   Lab Results  Component Value Date   RETICCTPCT 2.1 07/07/2023   RBC 3.43 (L) 09/15/2023  Lab Results  Component Value Date   KPAFRELGTCHN 6.0 09/01/2023   LAMBDASER 3.2 (L) 09/01/2023   KAPLAMBRATIO 1.88 (H) 09/01/2023   Lab Results  Component Value Date   IGGSERUM 691 09/01/2023   IGA 25 (L) 09/01/2023   IGMSERUM 15 (L) 09/01/2023   Lab Results  Component Value Date   TOTALPROTELP 6.2 09/01/2023   ALBUMINELP 3.8 07/07/2023   A1GS 0.3 07/07/2023   A2GS 0.5 07/07/2023   BETS 1.0 07/07/2023   BETA2SER 0.5 09/24/2014   GAMS 0.8 07/07/2023   MSPIKE 0.5 (H) 07/07/2023   SPEI Comment 06/09/2023     Chemistry      Component Value Date/Time   NA 139 09/15/2023 0928   NA 139 10/10/2015 0938   K 4.0 09/15/2023 0928   K 4.2 10/10/2015 0938   CL 106 09/15/2023 0928   CL 105 04/11/2015 0953   CO2 25 09/15/2023 0928   CO2 25 10/10/2015 0938   BUN 19 09/15/2023 0928   BUN 13.6 10/10/2015 0938   CREATININE 1.01 (H) 09/15/2023 0928   CREATININE 1.0 10/10/2015 0938      Component Value Date/Time   CALCIUM 9.5 09/15/2023 0928   CALCIUM 9.7 10/10/2015 0938   ALKPHOS 47 09/15/2023 0928   ALKPHOS 104 10/10/2015 0938   AST 10 (L) 09/15/2023 0928   AST 18 10/10/2015 0938   ALT 9 09/15/2023 0928   ALT 20 10/10/2015 0938   BILITOT 0.8 09/15/2023 0928   BILITOT 0.80 10/10/2015 0938     Impression and Plan: Ms. Asper is a pleasant 78 yo African American female with progressive IgG kappa myeloma.   Again, she has responded very nicely to chemotherapy.  We will see how she does with every other week treatment.  Hopefully, we will be able to get a 24-hour urine on her.  I would have wanted to have 1 done today.  Will make sure she gets the bottle.  We will plan to get her back in another month to see us .    7/17/20259:37 AM

## 2023-10-06 NOTE — Progress Notes (Signed)
 Patient is tolerating treatment well. Recent scan shows good response.   Oncology Nurse Navigator Documentation     10/06/2023    9:00 AM  Oncology Nurse Navigator Flowsheets  Navigator Follow Up Date: 11/03/2023  Navigator Follow Up Reason: Follow-up Appointment;Chemotherapy  Navigator Location [facility]  Navigator Encounter Type Follow-up Appt  Patient Visit Type MedOnc  Treatment Phase Active Tx  Barriers/Navigation Needs No Barriers At This Time  Interventions None Required  Acuity Level 1-No Barriers  Support Groups/Services Friends and Family  Time Spent with Patient 15

## 2023-10-07 LAB — KAPPA/LAMBDA LIGHT CHAINS
Kappa free light chain: 8.2 mg/L (ref 3.3–19.4)
Kappa, lambda light chain ratio: 2.22 — ABNORMAL HIGH (ref 0.26–1.65)
Lambda free light chains: 3.7 mg/L — ABNORMAL LOW (ref 5.7–26.3)

## 2023-10-07 LAB — IGG, IGA, IGM
IgA: 34 mg/dL — ABNORMAL LOW (ref 64–422)
IgG (Immunoglobin G), Serum: 752 mg/dL (ref 586–1602)
IgM (Immunoglobulin M), Srm: 18 mg/dL — ABNORMAL LOW (ref 26–217)

## 2023-10-08 ENCOUNTER — Other Ambulatory Visit: Payer: Self-pay

## 2023-10-12 LAB — PROTEIN ELECTROPHORESIS, SERUM, WITH REFLEX
A/G Ratio: 1.8 — ABNORMAL HIGH (ref 0.7–1.7)
Albumin ELP: 4.1 g/dL (ref 2.9–4.4)
Alpha-1-Globulin: 0.2 g/dL (ref 0.0–0.4)
Alpha-2-Globulin: 0.5 g/dL (ref 0.4–1.0)
Beta Globulin: 0.9 g/dL (ref 0.7–1.3)
Gamma Globulin: 0.6 g/dL (ref 0.4–1.8)
Globulin, Total: 2.3 g/dL (ref 2.2–3.9)
M-Spike, %: 0.4 g/dL — ABNORMAL HIGH
SPEP Interpretation: 0
Total Protein ELP: 6.4 g/dL (ref 6.0–8.5)

## 2023-10-12 LAB — IMMUNOFIXATION REFLEX, SERUM
IgA: 35 mg/dL — ABNORMAL LOW (ref 64–422)
IgG (Immunoglobin G), Serum: 779 mg/dL (ref 586–1602)
IgM (Immunoglobulin M), Srm: 18 mg/dL — ABNORMAL LOW (ref 26–217)

## 2023-10-13 ENCOUNTER — Ambulatory Visit: Payer: Self-pay | Admitting: Hematology & Oncology

## 2023-10-17 ENCOUNTER — Other Ambulatory Visit: Payer: Self-pay

## 2023-10-18 ENCOUNTER — Other Ambulatory Visit: Payer: Self-pay

## 2023-10-20 ENCOUNTER — Inpatient Hospital Stay

## 2023-10-20 VITALS — BP 122/64 | HR 80 | Temp 98.8°F | Resp 18

## 2023-10-20 DIAGNOSIS — Z5112 Encounter for antineoplastic immunotherapy: Secondary | ICD-10-CM | POA: Diagnosis not present

## 2023-10-20 DIAGNOSIS — C9000 Multiple myeloma not having achieved remission: Secondary | ICD-10-CM

## 2023-10-20 LAB — CBC WITH DIFFERENTIAL (CANCER CENTER ONLY)
Abs Immature Granulocytes: 0.01 K/uL (ref 0.00–0.07)
Basophils Absolute: 0 K/uL (ref 0.0–0.1)
Basophils Relative: 1 %
Eosinophils Absolute: 0 K/uL (ref 0.0–0.5)
Eosinophils Relative: 0 %
HCT: 28.3 % — ABNORMAL LOW (ref 36.0–46.0)
Hemoglobin: 9.8 g/dL — ABNORMAL LOW (ref 12.0–15.0)
Immature Granulocytes: 0 %
Lymphocytes Relative: 6 %
Lymphs Abs: 0.3 K/uL — ABNORMAL LOW (ref 0.7–4.0)
MCH: 30.5 pg (ref 26.0–34.0)
MCHC: 34.6 g/dL (ref 30.0–36.0)
MCV: 88.2 fL (ref 80.0–100.0)
Monocytes Absolute: 0.2 K/uL (ref 0.1–1.0)
Monocytes Relative: 3 %
Neutro Abs: 5.1 K/uL (ref 1.7–7.7)
Neutrophils Relative %: 90 %
Platelet Count: 270 K/uL (ref 150–400)
RBC: 3.21 MIL/uL — ABNORMAL LOW (ref 3.87–5.11)
RDW: 15.5 % (ref 11.5–15.5)
WBC Count: 5.7 K/uL (ref 4.0–10.5)
nRBC: 0 % (ref 0.0–0.2)

## 2023-10-20 LAB — CMP (CANCER CENTER ONLY)
ALT: 15 U/L (ref 0–44)
AST: 15 U/L (ref 15–41)
Albumin: 4.4 g/dL (ref 3.5–5.0)
Alkaline Phosphatase: 52 U/L (ref 38–126)
Anion gap: 12 (ref 5–15)
BUN: 13 mg/dL (ref 8–23)
CO2: 22 mmol/L (ref 22–32)
Calcium: 9.1 mg/dL (ref 8.9–10.3)
Chloride: 106 mmol/L (ref 98–111)
Creatinine: 0.87 mg/dL (ref 0.44–1.00)
GFR, Estimated: >60 mL/min (ref >60)
Glucose, Bld: 126 mg/dL — ABNORMAL HIGH (ref 70–99)
Potassium: 3.9 mmol/L (ref 3.5–5.1)
Sodium: 140 mmol/L (ref 135–145)
Total Bilirubin: 0.7 mg/dL (ref 0.0–1.2)
Total Protein: 6.4 g/dL — ABNORMAL LOW (ref 6.5–8.1)

## 2023-10-20 MED ORDER — DEXAMETHASONE 4 MG PO TABS
12.0000 mg | ORAL_TABLET | Freq: Once | ORAL | Status: DC
  Filled 2023-10-20: qty 3

## 2023-10-20 MED ORDER — HEPARIN SOD (PORK) LOCK FLUSH 100 UNIT/ML IV SOLN: 500.0000 [IU] | Freq: Once | INTRAVENOUS | Status: DC

## 2023-10-20 MED ORDER — BORTEZOMIB CHEMO SQ INJECTION 3.5 MG (2.5MG/ML)
1.5000 mg/m2 | Freq: Once | INTRAMUSCULAR | Status: AC
  Administered 2023-10-20: 2.75 mg via SUBCUTANEOUS
  Filled 2023-10-20: qty 1.1

## 2023-10-20 MED ORDER — SODIUM CHLORIDE 0.9 % IV SOLN: Freq: Once | INTRAVENOUS | Status: AC

## 2023-10-20 MED ORDER — SODIUM CHLORIDE 0.9% FLUSH: 10.0000 mL | INTRAVENOUS | Status: DC | PRN

## 2023-10-20 MED ORDER — SODIUM CHLORIDE 0.9 % IV SOLN
300.0000 mg/m2 | Freq: Once | INTRAVENOUS | Status: AC
  Administered 2023-10-20: 560 mg via INTRAVENOUS
  Filled 2023-10-20: qty 28

## 2023-10-20 NOTE — Patient Instructions (Addendum)
 [facility] - A DEPT OF [facility]  Discharge Instructions: Thank you for choosing [facility] to provide your oncology and hematology care.   If you have a lab appointment with the Cancer Center, please go directly to the Cancer Center and check in at the registration area.  Wear comfortable clothing and clothing appropriate for easy access to any Portacath or PICC line.   We strive to give you quality time with your provider. You may need to reschedule your appointment if you arrive late (15 or more minutes).  Arriving late affects you and other patients whose appointments are after yours.  Also, if you miss three or more appointments without notifying the office, you may be dismissed from the clinic at the provider's discretion.      For prescription refill requests, have your pharmacy contact our office and allow 72 hours for refills to be completed.    Today you received the following chemotherapy and/or immunotherapy agents [facility] - A DEPT OF [place]  Discharge Instructions: Thank you for choosing [facility] to provide your oncology and hematology care.   If you have a lab appointment with the Cancer Center, please go directly to the Cancer Center and check in at the registration area.  Wear comfortable clothing and clothing appropriate for easy access to any Portacath or PICC line.   We strive to give you quality time with your provider. You may need to reschedule your appointment if you arrive late (15 or more minutes).  Arriving late affects you and other patients whose appointments are after yours.  Also, if you miss three or more appointments without notifying the office, you may be dismissed from the clinic at the provider's discretion.      For prescription refill requests, have your pharmacy contact our office and allow 72 hours for refills to be completed.    Today you  received the following chemotherapy and/or immunotherapy agents cytoxan , velcade      To help prevent nausea and vomiting after your treatment, we encourage you to take your nausea medication as directed.  BELOW ARE SYMPTOMS THAT SHOULD BE REPORTED IMMEDIATELY: *FEVER GREATER THAN 100.4 F (38 C) OR HIGHER *CHILLS OR SWEATING *NAUSEA AND VOMITING THAT IS NOT CONTROLLED WITH YOUR NAUSEA MEDICATION *UNUSUAL SHORTNESS OF BREATH *UNUSUAL BRUISING OR BLEEDING *URINARY PROBLEMS (pain or burning when urinating, or frequent urination) *BOWEL PROBLEMS (unusual diarrhea, constipation, pain near the anus) TENDERNESS IN MOUTH AND THROAT WITH OR WITHOUT PRESENCE OF ULCERS (sore throat, sores in mouth, or a toothache) UNUSUAL RASH, SWELLING OR PAIN  UNUSUAL VAGINAL DISCHARGE OR ITCHING   Items with * indicate a potential emergency and should be followed up as soon as possible or go to the Emergency Department if any problems should occur.  Please show the CHEMOTHERAPY ALERT CARD or IMMUNOTHERAPY ALERT CARD at check-in to the Emergency Department and triage nurse. Should you have questions after your visit or need to cancel or reschedule your appointment, please contact [facility] - A DEPT OF [facility]  [phone] and follow the prompts.  Office hours are 8:00 a.m. to 4:30 p.m. Monday - Friday. Please note that voicemails left after 4:00 p.m. may not be returned until the following business day.  We are closed weekends and major holidays. You have access to a nurse at all times for urgent questions. Please call  the main number to the clinic [phone] and follow the prompts.  For any non-urgent questions, you may also contact your provider using MyChart. We now offer e-Visits for anyone 44 and older to request care online for non-urgent symptoms. For details visit [email or web address].   Also download the MyChart app! Go to the app store, search MyChart, open the  app, select [place], and log in with your MyChart username and password.        To help prevent nausea and vomiting after your treatment, we encourage you to take your nausea medication as directed.  BELOW ARE SYMPTOMS THAT SHOULD BE REPORTED IMMEDIATELY: *FEVER GREATER THAN 100.4 F (38 C) OR HIGHER *CHILLS OR SWEATING *NAUSEA AND VOMITING THAT IS NOT CONTROLLED WITH YOUR NAUSEA MEDICATION *UNUSUAL SHORTNESS OF BREATH *UNUSUAL BRUISING OR BLEEDING *URINARY PROBLEMS (pain or burning when urinating, or frequent urination) *BOWEL PROBLEMS (unusual diarrhea, constipation, pain near the anus) TENDERNESS IN MOUTH AND THROAT WITH OR WITHOUT PRESENCE OF ULCERS (sore throat, sores in mouth, or a toothache) UNUSUAL RASH, SWELLING OR PAIN  UNUSUAL VAGINAL DISCHARGE OR ITCHING   Items with * indicate a potential emergency and should be followed up as soon as possible or go to the Emergency Department if any problems should occur.  Please show the CHEMOTHERAPY ALERT CARD or IMMUNOTHERAPY ALERT CARD at check-in to the Emergency Department and triage nurse. Should you have questions after your visit or need to cancel or reschedule your appointment, please contact [facility] - A DEPT OF [facility]  [phone] and follow the prompts.  Office hours are 8:00 a.m. to 4:30 p.m. Monday - Friday. Please note that voicemails left after 4:00 p.m. may not be returned until the following business day.  We are closed weekends and major holidays. You have access to a nurse at all times for urgent questions. Please call the main number to the clinic [phone] and follow the prompts.  For any non-urgent questions, you may also contact your provider using MyChart. We now offer e-Visits for anyone 3 and older to request care online for non-urgent symptoms. For details visit [email or web address].   Also download the MyChart app! Go to the app store, search MyChart, open the  app, select [place], and log in with your MyChart username and password.

## 2023-10-20 NOTE — Patient Instructions (Signed)

## 2023-10-26 LAB — UPEP/UIFE/LIGHT CHAINS/TP, 24-HR UR
% BETA, Urine: 0 %
ALPHA 1 URINE: 0 %
Albumin, U: 100 %
Alpha 2, Urine: 0 %
Free Kappa Lt Chains,Ur: 1.95 mg/L (ref 1.17–86.46)
Free Kappa/Lambda Ratio: >2.83 (ref 1.83–14.26)
Free Lambda Lt Chains,Ur: <0.69 mg/L (ref 0.27–15.21)
GAMMA GLOBULIN URINE: 0 %
Total Protein, Urine-Ur/day: 66 mg/(24.h) (ref 30–150)
Total Protein, Urine: 4.4 mg/dL
Total Volume: 1500

## 2023-11-03 ENCOUNTER — Inpatient Hospital Stay: Attending: Hematology & Oncology

## 2023-11-03 ENCOUNTER — Other Ambulatory Visit: Payer: Self-pay

## 2023-11-03 ENCOUNTER — Inpatient Hospital Stay

## 2023-11-03 ENCOUNTER — Inpatient Hospital Stay (HOSPITAL_BASED_OUTPATIENT_CLINIC_OR_DEPARTMENT_OTHER): Admitting: Hematology & Oncology

## 2023-11-03 ENCOUNTER — Encounter: Payer: Self-pay | Admitting: Hematology & Oncology

## 2023-11-03 ENCOUNTER — Encounter: Payer: Self-pay | Admitting: *Deleted

## 2023-11-03 VITALS — BP 125/59 | HR 75 | Resp 18

## 2023-11-03 VITALS — BP 120/57 | HR 83 | Temp 98.2°F | Resp 18 | Ht 66.0 in | Wt 153.0 lb

## 2023-11-03 DIAGNOSIS — Z79899 Other long term (current) drug therapy: Secondary | ICD-10-CM | POA: Diagnosis not present

## 2023-11-03 DIAGNOSIS — Z5112 Encounter for antineoplastic immunotherapy: Secondary | ICD-10-CM | POA: Diagnosis present

## 2023-11-03 DIAGNOSIS — G629 Polyneuropathy, unspecified: Secondary | ICD-10-CM | POA: Diagnosis not present

## 2023-11-03 DIAGNOSIS — Z5111 Encounter for antineoplastic chemotherapy: Secondary | ICD-10-CM | POA: Insufficient documentation

## 2023-11-03 DIAGNOSIS — C9000 Multiple myeloma not having achieved remission: Secondary | ICD-10-CM

## 2023-11-03 LAB — CBC WITH DIFFERENTIAL (CANCER CENTER ONLY)
Abs Immature Granulocytes: 0.01 K/uL (ref 0.00–0.07)
Basophils Absolute: 0 K/uL (ref 0.0–0.1)
Basophils Relative: 1 %
Eosinophils Absolute: 0 K/uL (ref 0.0–0.5)
Eosinophils Relative: 1 %
HCT: 28.4 % — ABNORMAL LOW (ref 36.0–46.0)
Hemoglobin: 9.6 g/dL — ABNORMAL LOW (ref 12.0–15.0)
Immature Granulocytes: 0 %
Lymphocytes Relative: 8 %
Lymphs Abs: 0.4 K/uL — ABNORMAL LOW (ref 0.7–4.0)
MCH: 30.8 pg (ref 26.0–34.0)
MCHC: 33.8 g/dL (ref 30.0–36.0)
MCV: 91 fL (ref 80.0–100.0)
Monocytes Absolute: 0.2 K/uL (ref 0.1–1.0)
Monocytes Relative: 4 %
Neutro Abs: 4.4 K/uL (ref 1.7–7.7)
Neutrophils Relative %: 86 %
Platelet Count: 281 K/uL (ref 150–400)
RBC: 3.12 MIL/uL — ABNORMAL LOW (ref 3.87–5.11)
RDW: 15.9 % — ABNORMAL HIGH (ref 11.5–15.5)
WBC Count: 5.1 K/uL (ref 4.0–10.5)
nRBC: 0 % (ref 0.0–0.2)

## 2023-11-03 LAB — CMP (CANCER CENTER ONLY)
ALT: 13 U/L (ref 0–44)
AST: 17 U/L (ref 15–41)
Albumin: 4.3 g/dL (ref 3.5–5.0)
Alkaline Phosphatase: 51 U/L (ref 38–126)
Anion gap: 13 (ref 5–15)
BUN: 15 mg/dL (ref 8–23)
CO2: 23 mmol/L (ref 22–32)
Calcium: 9.4 mg/dL (ref 8.9–10.3)
Chloride: 105 mmol/L (ref 98–111)
Creatinine: 0.91 mg/dL (ref 0.44–1.00)
GFR, Estimated: >60 mL/min (ref >60)
Glucose, Bld: 136 mg/dL — ABNORMAL HIGH (ref 70–99)
Potassium: 3.9 mmol/L (ref 3.5–5.1)
Sodium: 140 mmol/L (ref 135–145)
Total Bilirubin: 0.5 mg/dL (ref 0.0–1.2)
Total Protein: 6.4 g/dL — ABNORMAL LOW (ref 6.5–8.1)

## 2023-11-03 LAB — LACTATE DEHYDROGENASE: LDH: 295 U/L — ABNORMAL HIGH (ref 98–192)

## 2023-11-03 MED ORDER — DEXAMETHASONE 4 MG PO TABS: 12.0000 mg | ORAL_TABLET | Freq: Once | ORAL | Status: DC

## 2023-11-03 MED ORDER — DIPHENHYDRAMINE HCL 25 MG PO CAPS: 50.0000 mg | ORAL_CAPSULE | Freq: Once | ORAL | Status: DC

## 2023-11-03 MED ORDER — DARATUMUMAB-HYALURONIDASE-FIHJ 1800-30000 MG-UT/15ML ~~LOC~~ SOLN
1800.0000 mg | Freq: Once | SUBCUTANEOUS | Status: AC
  Administered 2023-11-03: 1800 mg via SUBCUTANEOUS
  Filled 2023-11-03: qty 15

## 2023-11-03 MED ORDER — ACETAMINOPHEN 325 MG PO TABS: 650.0000 mg | ORAL_TABLET | Freq: Once | ORAL | Status: DC

## 2023-11-03 MED ORDER — SODIUM CHLORIDE 0.9 % IV SOLN
300.0000 mg/m2 | Freq: Once | INTRAVENOUS | Status: AC
  Administered 2023-11-03: 560 mg via INTRAVENOUS
  Filled 2023-11-03: qty 28

## 2023-11-03 MED ORDER — SODIUM CHLORIDE 0.9 % IV SOLN: INTRAVENOUS | Status: DC

## 2023-11-03 MED ORDER — BORTEZOMIB CHEMO SQ INJECTION 3.5 MG (2.5MG/ML)
1.5000 mg/m2 | Freq: Once | INTRAMUSCULAR | Status: AC
  Administered 2023-11-03: 2.75 mg via SUBCUTANEOUS
  Filled 2023-11-03: qty 1.1

## 2023-11-03 NOTE — Progress Notes (Signed)
 Patient is doing well on maintenance chemotherapy. She has not had any navigational needs in some time. Will discontinue active navigation at this time, but be available to the patient as needed in the future.   Oncology Nurse Navigator Documentation     11/03/2023    9:15 AM  Oncology Nurse Navigator Flowsheets  Navigation Complete Date: 11/03/2023  Post Navigation: Continue to Follow Patient? No  Reason Not Navigating Patient: Patient On Maintenance Chemotherapy  Navigator Location [facility]  Navigator Encounter Type Follow-up Appt  Patient Visit Type MedOnc  Treatment Phase Active Tx  Barriers/Navigation Needs No Barriers At This Time  Interventions None Required  Acuity Level 1-No Barriers  Support Groups/Services Friends and Family  Time Spent with Patient 15

## 2023-11-03 NOTE — Patient Instructions (Signed)
 [facility] - A DEPT OF [facility]  Discharge Instructions: Thank you for choosing [facility] to provide your oncology and hematology care.   If you have a lab appointment with the Cancer Center, please go directly to the Cancer Center and check in at the registration area.  Wear comfortable clothing and clothing appropriate for easy access to any Portacath or PICC line.   We strive to give you quality time with your provider. You may need to reschedule your appointment if you arrive late (15 or more minutes).  Arriving late affects you and other patients whose appointments are after yours.  Also, if you miss three or more appointments without notifying the office, you may be dismissed from the clinic at the provider's discretion.      For prescription refill requests, have your pharmacy contact our office and allow 72 hours for refills to be completed.    Today you received the following chemotherapy and/or immunotherapy agents Cytoxan, Velcade, Darzalex      To help prevent nausea and vomiting after your treatment, we encourage you to take your nausea medication as directed.  BELOW ARE SYMPTOMS THAT SHOULD BE REPORTED IMMEDIATELY: *FEVER GREATER THAN 100.4 F (38 C) OR HIGHER *CHILLS OR SWEATING *NAUSEA AND VOMITING THAT IS NOT CONTROLLED WITH YOUR NAUSEA MEDICATION *UNUSUAL SHORTNESS OF BREATH *UNUSUAL BRUISING OR BLEEDING *URINARY PROBLEMS (pain or burning when urinating, or frequent urination) *BOWEL PROBLEMS (unusual diarrhea, constipation, pain near the anus) TENDERNESS IN MOUTH AND THROAT WITH OR WITHOUT PRESENCE OF ULCERS (sore throat, sores in mouth, or a toothache) UNUSUAL RASH, SWELLING OR PAIN  UNUSUAL VAGINAL DISCHARGE OR ITCHING   Items with * indicate a potential emergency and should be followed up as soon as possible or go to the Emergency Department if any problems should occur.  Please show the CHEMOTHERAPY ALERT CARD or  IMMUNOTHERAPY ALERT CARD at check-in to the Emergency Department and triage nurse. Should you have questions after your visit or need to cancel or reschedule your appointment, please contact [facility] - A DEPT OF [facility]  [phone] and follow the prompts.  Office hours are 8:00 a.m. to 4:30 p.m. Monday - Friday. Please note that voicemails left after 4:00 p.m. may not be returned until the following business day.  We are closed weekends and major holidays. You have access to a nurse at all times for urgent questions. Please call the main number to the clinic [phone] and follow the prompts.  For any non-urgent questions, you may also contact your provider using MyChart. We now offer e-Visits for anyone 67 and older to request care online for non-urgent symptoms. For details visit [email or web address].   Also download the MyChart app! Go to the app store, search "MyChart", open the app, select , and log in with your MyChart username and password.

## 2023-11-03 NOTE — Progress Notes (Signed)
 Hematology and Oncology Follow Up Visit  Kelly Chase [id number] 08/16/1945 78 y.o. 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 Kelly Chase is here today for follow-up.  So far, everything is going pretty well for her.  She really is tolerated treatment nicely.  When we last saw her, her monoclonal spike was 0.4 g/dL.  Her IgG G level was 760 mg/dL.  Her kappa light chain was 0.2 mg/dL.  We did do a 24-hour urine on her.  This basically showed no monoclonal protein in her urine now.  Before we started treatment, she had 5420 mg/L of Kappa light chain.  I am just very impressed with how well she has responded.  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  I am not sure if this is from the chemotherapy.  She is on gabapentin  only at nighttime.  I told her to try the gabapentin  3 times a day.  This might help a little bit more.  She has had no change in bowel or bladder habits.  She has had no diarrhea or constipation.  There is no cough.  She has had no issues with rashes.  There has been no leg swelling.  She has had no bleeding.  Overall, I would have to say that her performance status is probably ECOG 1.    Wt Readings from Last 3 Encounters:  11/03/23 153 lb (69.4 kg)  10/06/23 147 lb (66.7 kg)  09/01/23 155 lb (70.3 kg)     Medications:  Allergies as of 11/03/2023       Reactions   Celecoxib Nausea And Vomiting   Chlorthalidone Palpitations   Feldene [piroxicam] Other (See Comments)   abd burning   Esomeprazole Other (See Comments)   Joint pain        Medication List        Accurate as of November 03, 2023  9:36 AM. If you have any questions, ask your nurse or doctor.          acetaminophen  500 MG tablet Commonly known as:  TYLENOL  Take 1,000 mg by mouth every 6 (six) hours as needed.   acyclovir  400 MG tablet Commonly known as: ZOVIRAX  Take 1 tablet (400 mg total) by mouth 2 (two) times daily.   amLODipine  10 MG tablet Commonly known as: NORVASC  TAKE 1 TABLET(10 MG) BY MOUTH DAILY   cyclobenzaprine  5 MG tablet Commonly known as: FLEXERIL  Take 1 tablet (5 mg total) by mouth every 8 (eight) hours as needed for muscle spasms.   dexamethasone  4 MG tablet Commonly known as: DECADRON  Take 3 tablets (12 mg total) by mouth See admin instructions. Take 5 tablets the morning of your chemotherapy appointment before arriving to the clinic   gabapentin  100 MG capsule Commonly known as: NEURONTIN  Take by mouth at bedtime as needed.   lidocaine -prilocaine  cream Commonly known as: EMLA  Apply 1 Application topically as needed.   lisinopril  20 MG tablet Commonly known as: ZESTRIL  Take 1 tablet (20 mg total) by mouth daily.   ondansetron  4 MG disintegrating tablet Commonly known as: ZOFRAN -ODT Take 1 tablet (4 mg total) by mouth every 8 (eight) hours as needed for nausea or vomiting.   ondansetron  8 MG tablet Commonly known as:  Zofran  Take 8 mg by mouth 30 to 60 min prior to Cyclophosphamide  administration then take 8 mg every 8 hrs as needed for nausea and vomiting.   prochlorperazine  10 MG tablet Commonly known as: COMPAZINE  Take 1 tablet (10 mg total) by mouth every 6 (six) hours as needed for nausea or vomiting.   traZODone  50 MG tablet Commonly known as: DESYREL  TAKE 1 TABLET BY MOUTH EVERYDAY AT BEDTIME        Allergies:  Allergies  Allergen Reactions   Celecoxib Nausea And Vomiting   Chlorthalidone Palpitations   Feldene [Piroxicam] Other (See Comments)    abd burning   Esomeprazole Other (See Comments)    Joint pain    Past Medical History, Surgical history, Social history, and Family History were reviewed and updated.  Review of Systems: Review of Systems  Constitutional:  Negative.   HENT: Negative.    Eyes: Negative.   Respiratory: Negative.    Cardiovascular: Negative.   Gastrointestinal: Negative.   Genitourinary: Negative.   Musculoskeletal: Negative.   Skin: Negative.   Neurological: Negative.   Endo/Heme/Allergies: Negative.   Psychiatric/Behavioral: Negative.        Physical Exam:  height is 5' 6 (1.676 m) and weight is 153 lb (69.4 kg). Her oral temperature is 98.2 F (36.8 C). Her blood pressure is 120/57 (abnormal) and her pulse is 83. Her respiration is 18 and oxygen saturation is 100%.   Wt Readings from Last 3 Encounters:  11/03/23 153 lb (69.4 kg)  10/06/23 147 lb (66.7 kg)  09/01/23 155 lb (70.3 kg)    Physical Exam Vitals reviewed.  HENT:     Head: Normocephalic and atraumatic.  Eyes:     Pupils: Pupils are equal, round, and reactive to light.  Cardiovascular:     Rate and Rhythm: Normal rate and regular rhythm.     Heart sounds: Normal heart sounds.  Pulmonary:     Effort: Pulmonary effort is normal.     Breath sounds: Normal breath sounds.  Abdominal:     General: Bowel sounds are normal.     Palpations: Abdomen is soft.  Musculoskeletal:        General: No swelling, tenderness or deformity. Normal range of motion.     Cervical back: Normal range of motion.     Left lower leg: No edema.     Comments: Non tender to palpation. No erythema, edema, or palpable bony abnormalities   Lymphadenopathy:     Cervical: No cervical adenopathy.  Skin:    General: Skin is warm and dry.     Findings: No erythema or rash.  Neurological:     Mental Status: She is alert and oriented to person, place, and time.  Psychiatric:        Behavior: Behavior normal.        Thought Content: Thought content normal.        Judgment: Judgment normal.      Lab Results  Component Value Date   WBC 5.7 10/20/2023   HGB 9.8 (L) 10/20/2023   HCT 28.3 (L) 10/20/2023   MCV 88.2 10/20/2023   PLT 270 10/20/2023   Lab Results  Component  Value Date   FERRITIN 732 (H) 07/07/2023   IRON 49 07/07/2023   TIBC 342 07/07/2023   UIBC 293 07/07/2023   IRONPCTSAT 14 07/07/2023   Lab Results  Component Value Date   RETICCTPCT 2.1 07/07/2023   RBC 3.21 (L) 10/20/2023   Lab Results  Component Value  Date   KPAFRELGTCHN 8.2 10/06/2023   LAMBDASER 3.7 (L) 10/06/2023   KAPLAMBRATIO >2.83 10/20/2023   Lab Results  Component Value Date   IGGSERUM 752 10/06/2023   IGGSERUM 779 10/06/2023   IGA 34 (L) 10/06/2023   IGA 35 (L) 10/06/2023   IGMSERUM 18 (L) 10/06/2023   IGMSERUM 18 (L) 10/06/2023   Lab Results  Component Value Date   TOTALPROTELP 6.4 10/06/2023   ALBUMINELP 4.1 10/06/2023   A1GS 0.2 10/06/2023   A2GS 0.5 10/06/2023   BETS 0.9 10/06/2023   BETA2SER 0.5 09/24/2014   GAMS 0.6 10/06/2023   MSPIKE 0.4 (H) 10/06/2023   SPEI Comment 06/09/2023     Chemistry      Component Value Date/Time   NA 140 10/20/2023 1012   NA 139 10/10/2015 0938   K 3.9 10/20/2023 1012   K 4.2 10/10/2015 0938   CL 106 10/20/2023 1012   CL 105 04/11/2015 0953   CO2 22 10/20/2023 1012   CO2 25 10/10/2015 0938   BUN 13 10/20/2023 1012   BUN 13.6 10/10/2015 0938   CREATININE 0.87 10/20/2023 1012   CREATININE 1.0 10/10/2015 0938      Component Value Date/Time   CALCIUM 9.1 10/20/2023 1012   CALCIUM 9.7 10/10/2015 0938   ALKPHOS 52 10/20/2023 1012   ALKPHOS 104 10/10/2015 0938   AST 15 10/20/2023 1012   AST 18 10/10/2015 0938   ALT 15 10/20/2023 1012   ALT 20 10/10/2015 0938   BILITOT 0.7 10/20/2023 1012   BILITOT 0.80 10/10/2015 0938     Impression and Plan: Ms. Chinchilla is a pleasant 78 yo African American female with progressive IgG kappa myeloma.   Again, she has responded very nicely to chemotherapy.  Will have to see what her monoclonal studies look like.  We find that everything is still doing well, we might just have her go to monthly treatment.  This was certainly make her life little more easier.  Again, I am not  sure if the neuropathy is from her chemotherapy or if it is from other issues.  I will plan to see her back in about a month.  I am just happy that she has responded so well.  Her quality of life really is doing nicely.   8/14/20259:36 AM

## 2023-11-04 LAB — KAPPA/LAMBDA LIGHT CHAINS
Kappa free light chain: 6.4 mg/L (ref 3.3–19.4)
Kappa, lambda light chain ratio: 1.73 — ABNORMAL HIGH (ref 0.26–1.65)
Lambda free light chains: 3.7 mg/L — ABNORMAL LOW (ref 5.7–26.3)

## 2023-11-04 LAB — IGG, IGA, IGM
IgA: 31 mg/dL — ABNORMAL LOW (ref 64–422)
IgG (Immunoglobin G), Serum: 666 mg/dL (ref 586–1602)
IgM (Immunoglobulin M), Srm: 14 mg/dL — ABNORMAL LOW (ref 26–217)

## 2023-11-06 ENCOUNTER — Other Ambulatory Visit: Payer: Self-pay

## 2023-11-09 LAB — PROTEIN ELECTROPHORESIS, SERUM, WITH REFLEX
A/G Ratio: 1.4 (ref 0.7–1.7)
Albumin ELP: 3.5 g/dL (ref 2.9–4.4)
Alpha-1-Globulin: 0.3 g/dL (ref 0.0–0.4)
Alpha-2-Globulin: 0.5 g/dL (ref 0.4–1.0)
Beta Globulin: 1 g/dL (ref 0.7–1.3)
Gamma Globulin: 0.7 g/dL (ref 0.4–1.8)
Globulin, Total: 2.5 g/dL (ref 2.2–3.9)
M-Spike, %: 0.2 g/dL — ABNORMAL HIGH
SPEP Interpretation: 0
Total Protein ELP: 6 g/dL (ref 6.0–8.5)

## 2023-11-09 LAB — IMMUNOFIXATION REFLEX, SERUM
IgA: 33 mg/dL — ABNORMAL LOW (ref 64–422)
IgG (Immunoglobin G), Serum: 693 mg/dL (ref 586–1602)
IgM (Immunoglobulin M), Srm: 14 mg/dL — ABNORMAL LOW (ref 26–217)

## 2023-11-13 ENCOUNTER — Other Ambulatory Visit: Payer: Self-pay | Admitting: Hematology & Oncology

## 2023-11-17 ENCOUNTER — Inpatient Hospital Stay

## 2023-11-17 VITALS — BP 106/61 | HR 80 | Temp 97.8°F | Resp 18

## 2023-11-17 DIAGNOSIS — Z5112 Encounter for antineoplastic immunotherapy: Secondary | ICD-10-CM | POA: Diagnosis not present

## 2023-11-17 DIAGNOSIS — C9000 Multiple myeloma not having achieved remission: Secondary | ICD-10-CM

## 2023-11-17 LAB — CBC WITH DIFFERENTIAL (CANCER CENTER ONLY)
Abs Immature Granulocytes: 0.01 K/uL (ref 0.00–0.07)
Basophils Absolute: 0 K/uL (ref 0.0–0.1)
Basophils Relative: 1 %
Eosinophils Absolute: 0 K/uL (ref 0.0–0.5)
Eosinophils Relative: 1 %
HCT: 29.7 % — ABNORMAL LOW (ref 36.0–46.0)
Hemoglobin: 10 g/dL — ABNORMAL LOW (ref 12.0–15.0)
Immature Granulocytes: 0 %
Lymphocytes Relative: 13 %
Lymphs Abs: 0.6 K/uL — ABNORMAL LOW (ref 0.7–4.0)
MCH: 29.8 pg (ref 26.0–34.0)
MCHC: 33.7 g/dL (ref 30.0–36.0)
MCV: 88.4 fL (ref 80.0–100.0)
Monocytes Absolute: 0.4 K/uL (ref 0.1–1.0)
Monocytes Relative: 9 %
Neutro Abs: 3.4 K/uL (ref 1.7–7.7)
Neutrophils Relative %: 76 %
Platelet Count: 270 K/uL (ref 150–400)
RBC: 3.36 MIL/uL — ABNORMAL LOW (ref 3.87–5.11)
RDW: 15.9 % — ABNORMAL HIGH (ref 11.5–15.5)
WBC Count: 4.4 K/uL (ref 4.0–10.5)
nRBC: 0 % (ref 0.0–0.2)

## 2023-11-17 LAB — CMP (CANCER CENTER ONLY)
ALT: 14 U/L (ref 0–44)
AST: 17 U/L (ref 15–41)
Albumin: 4.5 g/dL (ref 3.5–5.0)
Alkaline Phosphatase: 50 U/L (ref 38–126)
Anion gap: 12 (ref 5–15)
BUN: 14 mg/dL (ref 8–23)
CO2: 22 mmol/L (ref 22–32)
Calcium: 9.2 mg/dL (ref 8.9–10.3)
Chloride: 105 mmol/L (ref 98–111)
Creatinine: 0.95 mg/dL (ref 0.44–1.00)
GFR, Estimated: >60 mL/min (ref >60)
Glucose, Bld: 141 mg/dL — ABNORMAL HIGH (ref 70–99)
Potassium: 3.9 mmol/L (ref 3.5–5.1)
Sodium: 138 mmol/L (ref 135–145)
Total Bilirubin: 0.6 mg/dL (ref 0.0–1.2)
Total Protein: 6.7 g/dL (ref 6.5–8.1)

## 2023-11-17 MED ORDER — BORTEZOMIB CHEMO SQ INJECTION 3.5 MG (2.5MG/ML)
1.5000 mg/m2 | Freq: Once | INTRAMUSCULAR | Status: AC
  Administered 2023-11-17: 2.75 mg via SUBCUTANEOUS
  Filled 2023-11-17: qty 1.1

## 2023-11-17 MED ORDER — SODIUM CHLORIDE 0.9 % IV SOLN: Freq: Once | INTRAVENOUS | Status: AC

## 2023-11-17 MED ORDER — DEXAMETHASONE 4 MG PO TABS: 12.0000 mg | ORAL_TABLET | Freq: Once | ORAL | Status: DC

## 2023-11-17 MED ORDER — SODIUM CHLORIDE 0.9 % IV SOLN
300.0000 mg/m2 | Freq: Once | INTRAVENOUS | Status: AC
  Administered 2023-11-17: 560 mg via INTRAVENOUS
  Filled 2023-11-17: qty 28

## 2023-11-17 NOTE — Patient Instructions (Signed)
 [facility] A [facility]PT OF [facility]  Discharge Instructions: Thank you for choosing [facility] to provide your oncology and hematology care.   If you have a lab appointment with the Cancer Center, please go directly to the Cancer Center and check in at the registration area.  Wear comfortable clothing and clothing appropriate for easy access to any Portacath or PICC line.   We strive to give you quality time with your provider. You may need to reschedule your appointment if you arrive late (15 or more minutes).  Arriving late affects you and other patients whose appointments are after yours.  Also, if you miss three or more appointments without notifying the office, you may be dismissed from the clinic at the provider's discretion.      For prescription refill requests, have your pharmacy contact our office and allow 72 hours for refills to be completed.    Today you received the following chemotherapy and/or immunotherapy agents cytoxan, velcade      To help prevent nausea and vomiting after your treatment, we encourage you to take your nausea medication as directed.  BELOW ARE SYMPTOMS THAT SHOULD BE REPORTED IMMEDIATELY: *FEVER GREATER THAN 100.4 F (38 C) OR HIGHER *CHILLS OR SWEATING *NAUSEA AND VOMITING THAT IS NOT CONTROLLED WITH YOUR NAUSEA MEDICATION *UNUSUAL SHORTNESS OF BREATH *UNUSUAL BRUISING OR BLEEDING *URINARY PROBLEMS (pain or burning when urinating, or frequent urination) *BOWEL PROBLEMS (unusual diarrhea, constipation, pain near the anus) TENDERNESS IN MOUTH AND THROAT WITH OR WITHOUT PRESENCE OF ULCERS (sore throat, sores in mouth, or a toothache) UNUSUAL RASH, SWELLING OR PAIN  UNUSUAL VAGINAL DISCHARGE OR ITCHING   Items with * indicate a potential emergency and should be followed up as soon as possible or go to the Emergency Department if any problems should occur.  Please show the CHEMOTHERAPY ALERT CARD or  IMMUNOTHERAPY ALERT CARD at check-in to the Emergency Department and triage nurse. Should you have questions after your visit or need to cancel or reschedule your appointment, please contact [facility] - A DEPT OF [facility]  [phone] and follow the prompts.  Office hours are 8:00 a.m. to 4:30 p.m. Monday - Friday. Please note that voicemails left after 4:00 p.m. may not be returned until the following business day.  We are closed weekends and major holidays. You have access to a nurse at all times for urgent questions. Please call the main number to the clinic [phone] and follow the prompts.  For any non-urgent questions, you may also contact your provider using MyChart. We now offer e-Visits for anyone 19 and older to request care online for non-urgent symptoms. For details visit [email or web address].   Also download the MyChart app! Go to the app store, search "MyChart", open the app, select [place], and log in with your MyChart username and password.

## 2023-11-17 NOTE — Patient Instructions (Signed)

## 2023-12-01 ENCOUNTER — Inpatient Hospital Stay

## 2023-12-01 ENCOUNTER — Encounter: Payer: Self-pay | Admitting: Hematology & Oncology

## 2023-12-01 ENCOUNTER — Inpatient Hospital Stay: Attending: Hematology & Oncology

## 2023-12-01 ENCOUNTER — Inpatient Hospital Stay: Admitting: Hematology & Oncology

## 2023-12-01 VITALS — BP 114/61 | HR 76 | Temp 98.2°F | Resp 18 | Ht 66.0 in | Wt 152.3 lb

## 2023-12-01 VITALS — BP 120/67 | HR 77

## 2023-12-01 DIAGNOSIS — C9000 Multiple myeloma not having achieved remission: Secondary | ICD-10-CM

## 2023-12-01 DIAGNOSIS — Z5112 Encounter for antineoplastic immunotherapy: Secondary | ICD-10-CM | POA: Diagnosis present

## 2023-12-01 DIAGNOSIS — G629 Polyneuropathy, unspecified: Secondary | ICD-10-CM | POA: Insufficient documentation

## 2023-12-01 DIAGNOSIS — Z5111 Encounter for antineoplastic chemotherapy: Secondary | ICD-10-CM | POA: Diagnosis present

## 2023-12-01 DIAGNOSIS — Z79899 Other long term (current) drug therapy: Secondary | ICD-10-CM | POA: Diagnosis not present

## 2023-12-01 DIAGNOSIS — M898X9 Other specified disorders of bone, unspecified site: Secondary | ICD-10-CM

## 2023-12-01 LAB — CBC WITH DIFFERENTIAL (CANCER CENTER ONLY)
Abs Immature Granulocytes: 0.01 K/uL (ref 0.00–0.07)
Basophils Absolute: 0 K/uL (ref 0.0–0.1)
Basophils Relative: 1 %
Eosinophils Absolute: 0 K/uL (ref 0.0–0.5)
Eosinophils Relative: 1 %
HCT: 26.9 % — ABNORMAL LOW (ref 36.0–46.0)
Hemoglobin: 9.2 g/dL — ABNORMAL LOW (ref 12.0–15.0)
Immature Granulocytes: 0 %
Lymphocytes Relative: 11 %
Lymphs Abs: 0.5 K/uL — ABNORMAL LOW (ref 0.7–4.0)
MCH: 30.7 pg (ref 26.0–34.0)
MCHC: 34.2 g/dL (ref 30.0–36.0)
MCV: 89.7 fL (ref 80.0–100.0)
Monocytes Absolute: 0.3 K/uL (ref 0.1–1.0)
Monocytes Relative: 7 %
Neutro Abs: 3.5 K/uL (ref 1.7–7.7)
Neutrophils Relative %: 80 %
Platelet Count: 236 K/uL (ref 150–400)
RBC: 3 MIL/uL — ABNORMAL LOW (ref 3.87–5.11)
RDW: 15.8 % — ABNORMAL HIGH (ref 11.5–15.5)
WBC Count: 4.3 K/uL (ref 4.0–10.5)
nRBC: 0 % (ref 0.0–0.2)

## 2023-12-01 LAB — CMP (CANCER CENTER ONLY)
ALT: 14 U/L (ref 0–44)
AST: 17 U/L (ref 15–41)
Albumin: 4.3 g/dL (ref 3.5–5.0)
Alkaline Phosphatase: 49 U/L (ref 38–126)
Anion gap: 10 (ref 5–15)
BUN: 14 mg/dL (ref 8–23)
CO2: 23 mmol/L (ref 22–32)
Calcium: 9.1 mg/dL (ref 8.9–10.3)
Chloride: 105 mmol/L (ref 98–111)
Creatinine: 0.82 mg/dL (ref 0.44–1.00)
GFR, Estimated: >60 mL/min (ref >60)
Glucose, Bld: 133 mg/dL — ABNORMAL HIGH (ref 70–99)
Potassium: 4.2 mmol/L (ref 3.5–5.1)
Sodium: 138 mmol/L (ref 135–145)
Total Bilirubin: 0.5 mg/dL (ref 0.0–1.2)
Total Protein: 6.1 g/dL — ABNORMAL LOW (ref 6.5–8.1)

## 2023-12-01 LAB — VITAMIN B12: Vitamin B-12: 397 pg/mL (ref 180–914)

## 2023-12-01 LAB — LACTATE DEHYDROGENASE: LDH: 292 U/L — ABNORMAL HIGH (ref 98–192)

## 2023-12-01 MED ORDER — ACETAMINOPHEN 325 MG PO TABS: 650.0000 mg | ORAL_TABLET | Freq: Once | ORAL | Status: DC

## 2023-12-01 MED ORDER — SODIUM CHLORIDE 0.9 % IV SOLN: INTRAVENOUS | Status: DC

## 2023-12-01 MED ORDER — DIPHENHYDRAMINE HCL 25 MG PO CAPS: 50.0000 mg | ORAL_CAPSULE | Freq: Once | ORAL | Status: DC

## 2023-12-01 MED ORDER — ZOLEDRONIC ACID 4 MG/5ML IV CONC
3.5000 mg | Freq: Once | INTRAVENOUS | Status: AC
  Administered 2023-12-01: 3.5 mg via INTRAVENOUS
  Filled 2023-12-01: qty 4.38

## 2023-12-01 MED ORDER — SODIUM CHLORIDE 0.9 % IV SOLN
300.0000 mg/m2 | Freq: Once | INTRAVENOUS | Status: AC
  Administered 2023-12-01: 560 mg via INTRAVENOUS
  Filled 2023-12-01: qty 28

## 2023-12-01 MED ORDER — DARATUMUMAB-HYALURONIDASE-FIHJ 1800-30000 MG-UT/15ML ~~LOC~~ SOLN
1800.0000 mg | Freq: Once | SUBCUTANEOUS | Status: AC
  Administered 2023-12-01: 1800 mg via SUBCUTANEOUS
  Filled 2023-12-01: qty 15

## 2023-12-01 MED ORDER — DEXAMETHASONE 4 MG PO TABS: 12.0000 mg | ORAL_TABLET | Freq: Once | ORAL | Status: DC

## 2023-12-01 MED ORDER — BORTEZOMIB CHEMO SQ INJECTION 3.5 MG (2.5MG/ML)
1.3000 mg/m2 | Freq: Once | INTRAMUSCULAR | Status: AC
  Administered 2023-12-01: 2.5 mg via SUBCUTANEOUS
  Filled 2023-12-01: qty 1

## 2023-12-01 NOTE — Progress Notes (Signed)
 Hematology and Oncology Follow Up Visit  Kelly Chase [id number] 08-30-1945 78 y.o. 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  So far, everything is going pretty well for her.  She really is tolerated treatment nicely.  When we last saw her, her monoclonal spike was 0.2 g/dL.  Her IgG  was 680 mg/dL.  Her kappa light chain was 0.6 mg/dL.  We did do a 24-hour urine on her.  This basically showed no monoclonal protein in her urine now.  Before we started treatment, she had 5420 mg/L of Kappa light chain.  I am just very impressed with how well she has responded.  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  I am not sure if this is from the chemotherapy.  She is on gabapentin  only at nighttime.  I told her to try the gabapentin  3 times a day.  This might help a little bit more.  She has had no change in bowel or bladder habits.  She has had no diarrhea or constipation.  There is no cough.  She has had no issues with rashes.  There has been no leg swelling.  She has had no bleeding.  Overall, I would have to say that her performance status is probably ECOG 1.    Wt Readings from Last 3 Encounters:  12/01/23 152 lb 4.8 oz (69.1 kg)  11/03/23 153 lb (69.4 kg)  10/06/23 147 lb (66.7 kg)     Medications:  Allergies as of 12/01/2023       Reactions   Celecoxib Nausea And Vomiting   Chlorthalidone Palpitations   Feldene [piroxicam] Other (See Comments)   abd burning   Esomeprazole Other (See Comments)   Joint pain        Medication List        Accurate as of December 01, 2023  1:28 PM. If you have any questions, ask your nurse or doctor.          acetaminophen  500 MG tablet Commonly known as:  TYLENOL  Take 1,000 mg by mouth every 6 (six) hours as needed.   acyclovir  400 MG tablet Commonly known as: ZOVIRAX  Take 1 tablet (400 mg total) by mouth 2 (two) times daily.   amLODipine  10 MG tablet Commonly known as: NORVASC  TAKE 1 TABLET(10 MG) BY MOUTH DAILY   cyclobenzaprine  5 MG tablet Commonly known as: FLEXERIL  Take 1 tablet (5 mg total) by mouth every 8 (eight) hours as needed for muscle spasms.   dexamethasone  4 MG tablet Commonly known as: DECADRON  Take 3 tablets (12 mg total) by mouth See admin instructions. Take 5 tablets the morning of your chemotherapy appointment before arriving to the clinic   gabapentin  100 MG capsule Commonly known as: NEURONTIN  Take by mouth at bedtime as needed.   lidocaine -prilocaine  cream Commonly known as: EMLA  Apply 1 Application topically as needed.   lisinopril  20 MG tablet Commonly known as: ZESTRIL  Take 1 tablet (20 mg total) by mouth daily.   ondansetron  4 MG disintegrating tablet Commonly known as: ZOFRAN -ODT Take 1 tablet (4 mg total) by mouth every 8 (eight) hours as needed for nausea or vomiting.   ondansetron  8 MG tablet Commonly known  as: Zofran  Take 8 mg by mouth 30 to 60 min prior to Cyclophosphamide  administration then take 8 mg every 8 hrs as needed for nausea and vomiting.   prochlorperazine  10 MG tablet Commonly known as: COMPAZINE  Take 1 tablet (10 mg total) by mouth every 6 (six) hours as needed for nausea or vomiting.   traZODone  50 MG tablet Commonly known as: DESYREL  TAKE 1 TABLET BY MOUTH EVERYDAY AT BEDTIME        Allergies:  Allergies  Allergen Reactions   Celecoxib Nausea And Vomiting   Chlorthalidone Palpitations   Feldene [Piroxicam] Other (See Comments)    abd burning   Esomeprazole Other (See Comments)    Joint pain    Past Medical History, Surgical history, Social history, and Family History were reviewed and updated.  Review of Systems: Review of Systems  Constitutional:  Negative.   HENT: Negative.    Eyes: Negative.   Respiratory: Negative.    Cardiovascular: Negative.   Gastrointestinal: Negative.   Genitourinary: Negative.   Musculoskeletal: Negative.   Skin: Negative.   Neurological: Negative.   Endo/Heme/Allergies: Negative.   Psychiatric/Behavioral: Negative.        Physical Exam:  height is 5' 6 (1.676 m) and weight is 152 lb 4.8 oz (69.1 kg). Her oral temperature is 98.2 F (36.8 C). Her blood pressure is 114/61 and her pulse is 76. Her respiration is 18 and oxygen saturation is 100%.   Wt Readings from Last 3 Encounters:  12/01/23 152 lb 4.8 oz (69.1 kg)  11/03/23 153 lb (69.4 kg)  10/06/23 147 lb (66.7 kg)    Physical Exam Vitals reviewed.  HENT:     Head: Normocephalic and atraumatic.  Eyes:     Pupils: Pupils are equal, round, and reactive to light.  Cardiovascular:     Rate and Rhythm: Normal rate and regular rhythm.     Heart sounds: Normal heart sounds.  Pulmonary:     Effort: Pulmonary effort is normal.     Breath sounds: Normal breath sounds.  Abdominal:     General: Bowel sounds are normal.     Palpations: Abdomen is soft.  Musculoskeletal:        General: No swelling, tenderness or deformity. Normal range of motion.     Cervical back: Normal range of motion.     Left lower leg: No edema.     Comments: Non tender to palpation. No erythema, edema, or palpable bony abnormalities   Lymphadenopathy:     Cervical: No cervical adenopathy.  Skin:    General: Skin is warm and dry.     Findings: No erythema or rash.  Neurological:     Mental Status: She is alert and oriented to person, place, and time.  Psychiatric:        Behavior: Behavior normal.        Thought Content: Thought content normal.        Judgment: Judgment normal.      Lab Results  Component Value Date   WBC 4.3 12/01/2023   HGB 9.2 (L) 12/01/2023   HCT 26.9 (L) 12/01/2023   MCV 89.7 12/01/2023   PLT 236 12/01/2023   Lab Results  Component  Value Date   FERRITIN 732 (H) 07/07/2023   IRON 49 07/07/2023   TIBC 342 07/07/2023   UIBC 293 07/07/2023   IRONPCTSAT 14 07/07/2023   Lab Results  Component Value Date   RETICCTPCT 2.1 07/07/2023   RBC 3.00 (L) 12/01/2023   Lab  Results  Component Value Date   KPAFRELGTCHN 6.4 11/03/2023   LAMBDASER 3.7 (L) 11/03/2023   KAPLAMBRATIO 1.73 (H) 11/03/2023   Lab Results  Component Value Date   IGGSERUM 666 11/03/2023   IGGSERUM 693 11/03/2023   IGA 31 (L) 11/03/2023   IGA 33 (L) 11/03/2023   IGMSERUM 14 (L) 11/03/2023   IGMSERUM 14 (L) 11/03/2023   Lab Results  Component Value Date   TOTALPROTELP 6.0 11/03/2023   ALBUMINELP 3.5 11/03/2023   A1GS 0.3 11/03/2023   A2GS 0.5 11/03/2023   BETS 1.0 11/03/2023   BETA2SER 0.5 09/24/2014   GAMS 0.7 11/03/2023   MSPIKE 0.2 (H) 11/03/2023   SPEI Comment 06/09/2023     Chemistry      Component Value Date/Time   NA 138 12/01/2023 0948   NA 139 10/10/2015 0938   K 4.2 12/01/2023 0948   K 4.2 10/10/2015 0938   CL 105 12/01/2023 0948   CL 105 04/11/2015 0953   CO2 23 12/01/2023 0948   CO2 25 10/10/2015 0938   BUN 14 12/01/2023 0948   BUN 13.6 10/10/2015 0938   CREATININE 0.82 12/01/2023 0948   CREATININE 1.0 10/10/2015 0938      Component Value Date/Time   CALCIUM 9.1 12/01/2023 0948   CALCIUM 9.7 10/10/2015 0938   ALKPHOS 49 12/01/2023 0948   ALKPHOS 104 10/10/2015 0938   AST 17 12/01/2023 0948   AST 18 10/10/2015 0938   ALT 14 12/01/2023 0948   ALT 20 10/10/2015 0938   BILITOT 0.5 12/01/2023 0948   BILITOT 0.80 10/10/2015 0938     Impression and Plan: Kelly Chase is a pleasant 78 yo African American female with progressive IgG kappa myeloma.   Again, she has responded very nicely to chemotherapy.  At this point, I may want to think about pulling back on her treatment somewhat.  We may be able to just go with Faspro.  For the neuropathy, we may want to think about holding on the Velcade .  I am just happy that she  has done well.  Her quality life is doing pretty well.  I think she is tolerated treatment much better than I would have thought.  For right now, we will plan to get her back in 1 month.  At that time, I will see about having her just do maintenance Faspro.     9/11/20251:28 PM

## 2023-12-01 NOTE — Patient Instructions (Addendum)
 [facility] - A DEPT OF [facility]  Discharge Instructions: Thank you for choosing [facility] to provide your oncology and hematology care.   If you have a lab appointment with the Cancer Center, please go directly to the Cancer Center and check in at the registration area.  Wear comfortable clothing and clothing appropriate for easy access to any Portacath or PICC line.   We strive to give you quality time with your provider. You may need to reschedule your appointment if you arrive late (15 or more minutes).  Arriving late affects you and other patients whose appointments are after yours.  Also, if you miss three or more appointments without notifying the office, you may be dismissed from the clinic at the provider's discretion.      For prescription refill requests, have your pharmacy contact our office and allow 72 hours for refills to be completed.    Today you received the following chemotherapy and/or immunotherapy agents:  Cytoxan , Velcade  and Faspro      To help prevent nausea and vomiting after your treatment, we encourage you to take your nausea medication as directed.  BELOW ARE SYMPTOMS THAT SHOULD BE REPORTED IMMEDIATELY: *FEVER GREATER THAN 100.4 F (38 C) OR HIGHER *CHILLS OR SWEATING *NAUSEA AND VOMITING THAT IS NOT CONTROLLED WITH YOUR NAUSEA MEDICATION *UNUSUAL SHORTNESS OF BREATH *UNUSUAL BRUISING OR BLEEDING *URINARY PROBLEMS (pain or burning when urinating, or frequent urination) *BOWEL PROBLEMS (unusual diarrhea, constipation, pain near the anus) TENDERNESS IN MOUTH AND THROAT WITH OR WITHOUT PRESENCE OF ULCERS (sore throat, sores in mouth, or a toothache) UNUSUAL RASH, SWELLING OR PAIN  UNUSUAL VAGINAL DISCHARGE OR ITCHING   Items with * indicate a potential emergency and should be followed up as soon as possible or go to the Emergency Department if any problems should occur.  Please show the CHEMOTHERAPY ALERT CARD  or IMMUNOTHERAPY ALERT CARD at check-in to the Emergency Department and triage nurse. Should you have questions after your visit or need to cancel or reschedule your appointment, please contact [facility] - A DEPT OF [facility]  [phone] and follow the prompts.  Office hours are 8:00 a.m. to 4:30 p.m. Monday - Friday. Please note that voicemails left after 4:00 p.m. may not be returned until the following business day.  We are closed weekends and major holidays. You have access to a nurse at all times for urgent questions. Please call the main number to the clinic [phone] and follow the prompts.  For any non-urgent questions, you may also contact your provider using MyChart. We now offer e-Visits for anyone 89 and older to request care online for non-urgent symptoms. For details visit [email or web address].   Also download the MyChart app! Go to the app store, search MyChart, open the app, select [place], and log in with your MyChart username and password.  Zoledronic  Acid Injection (Cancer) What is this medication? ZOLEDRONIC  ACID (ZOE le dron ik AS id) treats high calcium levels in the blood caused by cancer. It may also be used with chemotherapy to treat weakened bones caused by cancer. It works by slowing down the release of calcium from bones. This lowers calcium levels in your blood. It also makes your bones stronger and less likely to break (fracture). It belongs to a group of medications called bisphosphonates. This medicine may be used for other purposes; ask your health care provider or pharmacist if you  have questions. COMMON BRAND NAME(S): Zometa , Zometa  Powder What should I tell my care team before I take this medication? They need to know if you have any of these conditions: Dehydration Dental disease Kidney disease Liver disease Low levels of calcium in the blood Lung or breathing disease, such as asthma Receiving steroids, such as  dexamethasone  or prednisone  An unusual or allergic reaction to zoledronic  acid, other medications, foods, dyes, or preservatives Pregnant or trying to get pregnant Breast-feeding How should I use this medication? This medication is injected into a vein. It is given by your care team in a hospital or clinic setting. Talk to your care team about the use of this medication in children. Special care may be needed. Overdosage: If you think you have taken too much of this medicine contact a poison control center or emergency room at once. NOTE: This medicine is only for you. Do not share this medicine with others. What if I miss a dose? Keep appointments for follow-up doses. It is important not to miss your dose. Call your care team if you are unable to keep an appointment. What may interact with this medication? Certain antibiotics given by injection Diuretics, such as bumetanide, furosemide NSAIDs, medications for pain and inflammation, such as ibuprofen or naproxen Teriparatide Thalidomide This list may not describe all possible interactions. Give your health care provider a list of all the medicines, herbs, non-prescription drugs, or dietary supplements you use. Also tell them if you smoke, drink alcohol, or use illegal drugs. Some items may interact with your medicine. What should I watch for while using this medication? Visit your care team for regular checks on your progress. It may be some time before you see the benefit from this medication. Some people who take this medication have severe bone, joint, or muscle pain. This medication may also increase your risk for jaw problems or a broken thigh bone. Tell your care team right away if you have severe pain in your jaw, bones, joints, or muscles. Tell you care team if you have any pain that does not go away or that gets worse. Tell your dentist and dental surgeon that you are taking this medication. You should not have major dental surgery  while on this medication. See your dentist to have a dental exam and fix any dental problems before starting this medication. Take good care of your teeth while on this medication. Make sure you see your dentist for regular follow-up appointments. You should make sure you get enough calcium and vitamin D while you are taking this medication. Discuss the foods you eat and the vitamins you take with your care team. Check with your care team if you have severe diarrhea, nausea, and vomiting, or if you sweat a lot. The loss of too much body fluid may make it dangerous for you to take this medication. You may need bloodwork while taking this medication. Talk to your care team if you wish to become pregnant or think you might be pregnant. This medication can cause serious birth defects. What side effects may I notice from receiving this medication? Side effects that you should report to your care team as soon as possible: Allergic reactions--skin rash, itching, hives, swelling of the face, lips, tongue, or throat Kidney injury--decrease in the amount of urine, swelling of the ankles, hands, or feet Low calcium level--muscle pain or cramps, confusion, tingling, or numbness in the hands or feet Osteonecrosis of the jaw--pain, swelling, or redness in the mouth, numbness  of the jaw, poor healing after dental work, unusual discharge from the mouth, visible bones in the mouth Severe bone, joint, or muscle pain Side effects that usually do not require medical attention (report to your care team if they continue or are bothersome): Constipation Fatigue Fever Loss of appetite Nausea Stomach pain This list may not describe all possible side effects. Call your doctor for medical advice about side effects. You may report side effects to FDA at 1-800-FDA-1088. Where should I keep my medication? This medication is given in a hospital or clinic. It will not be stored at home. NOTE: This sheet is a summary. It may  not cover all possible information. If you have questions about this medicine, talk to your doctor, pharmacist, or health care provider.  2024 Elsevier/Gold Standard (2021-05-01 00:00:00)

## 2023-12-02 LAB — IGG, IGA, IGM
IgA: 32 mg/dL — ABNORMAL LOW (ref 64–422)
IgG (Immunoglobin G), Serum: 621 mg/dL (ref 586–1602)
IgM (Immunoglobulin M), Srm: 12 mg/dL — ABNORMAL LOW (ref 26–217)

## 2023-12-02 LAB — KAPPA/LAMBDA LIGHT CHAINS
Kappa free light chain: 6.7 mg/L (ref 3.3–19.4)
Kappa, lambda light chain ratio: 2.09 — ABNORMAL HIGH (ref 0.26–1.65)
Lambda free light chains: 3.2 mg/L — ABNORMAL LOW (ref 5.7–26.3)

## 2023-12-04 ENCOUNTER — Other Ambulatory Visit: Payer: Self-pay

## 2023-12-07 LAB — PROTEIN ELECTROPHORESIS, SERUM, WITH REFLEX
A/G Ratio: 2.1 — ABNORMAL HIGH (ref 0.7–1.7)
Albumin ELP: 3.8 g/dL (ref 2.9–4.4)
Alpha-1-Globulin: 0.2 g/dL (ref 0.0–0.4)
Alpha-2-Globulin: 0.4 g/dL (ref 0.4–1.0)
Beta Globulin: 0.7 g/dL (ref 0.7–1.3)
Gamma Globulin: 0.4 g/dL (ref 0.4–1.8)
Globulin, Total: 1.8 g/dL — ABNORMAL LOW (ref 2.2–3.9)
M-Spike, %: 0.2 g/dL — ABNORMAL HIGH
SPEP Interpretation: 0
Total Protein ELP: 5.6 g/dL — ABNORMAL LOW (ref 6.0–8.5)

## 2023-12-07 LAB — IMMUNOFIXATION REFLEX, SERUM
IgA: 32 mg/dL — ABNORMAL LOW (ref 64–422)
IgG (Immunoglobin G), Serum: 613 mg/dL (ref 586–1602)
IgM (Immunoglobulin M), Srm: 13 mg/dL — ABNORMAL LOW (ref 26–217)

## 2023-12-21 ENCOUNTER — Other Ambulatory Visit: Payer: Self-pay

## 2023-12-26 ENCOUNTER — Other Ambulatory Visit: Payer: Self-pay

## 2023-12-28 ENCOUNTER — Other Ambulatory Visit: Payer: Self-pay | Admitting: Hematology & Oncology

## 2023-12-28 DIAGNOSIS — C9000 Multiple myeloma not having achieved remission: Secondary | ICD-10-CM

## 2023-12-29 ENCOUNTER — Inpatient Hospital Stay: Attending: Hematology & Oncology

## 2023-12-29 ENCOUNTER — Inpatient Hospital Stay

## 2023-12-29 ENCOUNTER — Encounter: Payer: Self-pay | Admitting: Hematology & Oncology

## 2023-12-29 ENCOUNTER — Inpatient Hospital Stay: Admitting: Hematology & Oncology

## 2023-12-29 ENCOUNTER — Other Ambulatory Visit: Payer: Self-pay | Admitting: Oncology

## 2023-12-29 VITALS — BP 111/57 | HR 81 | Resp 18

## 2023-12-29 VITALS — BP 127/72 | HR 76 | Temp 97.9°F | Resp 18 | Wt 151.0 lb

## 2023-12-29 DIAGNOSIS — C9000 Multiple myeloma not having achieved remission: Secondary | ICD-10-CM

## 2023-12-29 DIAGNOSIS — G629 Polyneuropathy, unspecified: Secondary | ICD-10-CM | POA: Insufficient documentation

## 2023-12-29 DIAGNOSIS — Z5112 Encounter for antineoplastic immunotherapy: Secondary | ICD-10-CM | POA: Insufficient documentation

## 2023-12-29 DIAGNOSIS — Z79899 Other long term (current) drug therapy: Secondary | ICD-10-CM | POA: Insufficient documentation

## 2023-12-29 LAB — CBC WITH DIFFERENTIAL (CANCER CENTER ONLY)
Abs Immature Granulocytes: 0.01 K/uL (ref 0.00–0.07)
Basophils Absolute: 0 K/uL (ref 0.0–0.1)
Basophils Relative: 1 %
Eosinophils Absolute: 0.1 K/uL (ref 0.0–0.5)
Eosinophils Relative: 2 %
HCT: 28.2 % — ABNORMAL LOW (ref 36.0–46.0)
Hemoglobin: 9.6 g/dL — ABNORMAL LOW (ref 12.0–15.0)
Immature Granulocytes: 0 %
Lymphocytes Relative: 22 %
Lymphs Abs: 0.9 K/uL (ref 0.7–4.0)
MCH: 29.5 pg (ref 26.0–34.0)
MCHC: 34 g/dL (ref 30.0–36.0)
MCV: 86.8 fL (ref 80.0–100.0)
Monocytes Absolute: 0.4 K/uL (ref 0.1–1.0)
Monocytes Relative: 11 %
Neutro Abs: 2.4 K/uL (ref 1.7–7.7)
Neutrophils Relative %: 64 %
Platelet Count: 230 K/uL (ref 150–400)
RBC: 3.25 MIL/uL — ABNORMAL LOW (ref 3.87–5.11)
RDW: 15.9 % — ABNORMAL HIGH (ref 11.5–15.5)
WBC Count: 3.8 K/uL — ABNORMAL LOW (ref 4.0–10.5)
nRBC: 0 % (ref 0.0–0.2)

## 2023-12-29 LAB — CMP (CANCER CENTER ONLY)
ALT: 13 U/L (ref 0–44)
AST: 17 U/L (ref 15–41)
Albumin: 4.4 g/dL (ref 3.5–5.0)
Alkaline Phosphatase: 50 U/L (ref 38–126)
Anion gap: 12 (ref 5–15)
BUN: 15 mg/dL (ref 8–23)
CO2: 22 mmol/L (ref 22–32)
Calcium: 9.1 mg/dL (ref 8.9–10.3)
Chloride: 103 mmol/L (ref 98–111)
Creatinine: 0.88 mg/dL (ref 0.44–1.00)
GFR, Estimated: >60 mL/min (ref >60)
Glucose, Bld: 139 mg/dL — ABNORMAL HIGH (ref 70–99)
Potassium: 3.9 mmol/L (ref 3.5–5.1)
Sodium: 137 mmol/L (ref 135–145)
Total Bilirubin: 0.7 mg/dL (ref 0.0–1.2)
Total Protein: 6.3 g/dL — ABNORMAL LOW (ref 6.5–8.1)

## 2023-12-29 LAB — LACTATE DEHYDROGENASE: LDH: 281 U/L — ABNORMAL HIGH (ref 98–192)

## 2023-12-29 MED ORDER — DARATUMUMAB-HYALURONIDASE-FIHJ 1800-30000 MG-UT/15ML ~~LOC~~ SOLN
1800.0000 mg | Freq: Once | SUBCUTANEOUS | Status: AC
  Administered 2023-12-29: 1800 mg via SUBCUTANEOUS
  Filled 2023-12-29: qty 15

## 2023-12-29 MED ORDER — DEXAMETHASONE 4 MG PO TABS: 12.0000 mg | ORAL_TABLET | Freq: Once | ORAL | Status: DC

## 2023-12-29 MED ORDER — DIPHENHYDRAMINE HCL 25 MG PO CAPS: 50.0000 mg | ORAL_CAPSULE | Freq: Once | ORAL | Status: DC

## 2023-12-29 MED ORDER — DEXAMETHASONE 4 MG PO TABS: 12.0000 mg | ORAL_TABLET | ORAL | Status: DC

## 2023-12-29 MED ORDER — ACETAMINOPHEN 325 MG PO TABS: 650.0000 mg | ORAL_TABLET | Freq: Once | ORAL | Status: DC

## 2023-12-29 NOTE — Patient Instructions (Signed)
 [facility] - A DEPT OF [facility]  Discharge Instructions: Thank you for choosing [facility] to provide your oncology and hematology care.   If you have a lab appointment with the Cancer Center, please go directly to the Cancer Center and check in at the registration area.  Wear comfortable clothing and clothing appropriate for easy access to any Portacath or PICC line.   We strive to give you quality time with your provider. You may need to reschedule your appointment if you arrive late (15 or more minutes).  Arriving late affects you and other patients whose appointments are after yours.  Also, if you miss three or more appointments without notifying the office, you may be dismissed from the clinic at the provider's discretion.      For prescription refill requests, have your pharmacy contact our office and allow 72 hours for refills to be completed.    Today you received the following chemotherapy and/or immunotherapy agents:  Darzalex/Faspro      To help prevent nausea and vomiting after your treatment, we encourage you to take your nausea medication as directed.  BELOW ARE SYMPTOMS THAT SHOULD BE REPORTED IMMEDIATELY: *FEVER GREATER THAN 100.4 F (38 C) OR HIGHER *CHILLS OR SWEATING *NAUSEA AND VOMITING THAT IS NOT CONTROLLED WITH YOUR NAUSEA MEDICATION *UNUSUAL SHORTNESS OF BREATH *UNUSUAL BRUISING OR BLEEDING *URINARY PROBLEMS (pain or burning when urinating, or frequent urination) *BOWEL PROBLEMS (unusual diarrhea, constipation, pain near the anus) TENDERNESS IN MOUTH AND THROAT WITH OR WITHOUT PRESENCE OF ULCERS (sore throat, sores in mouth, or a toothache) UNUSUAL RASH, SWELLING OR PAIN  UNUSUAL VAGINAL DISCHARGE OR ITCHING   Items with * indicate a potential emergency and should be followed up as soon as possible or go to the Emergency Department if any problems should occur.  Please show the CHEMOTHERAPY ALERT CARD or  IMMUNOTHERAPY ALERT CARD at check-in to the Emergency Department and triage nurse. Should you have questions after your visit or need to cancel or reschedule your appointment, please contact [facility] - A DEPT OF [facility]  [phone] and follow the prompts.  Office hours are 8:00 a.m. to 4:30 p.m. Monday - Friday. Please note that voicemails left after 4:00 p.m. may not be returned until the following business day.  We are closed weekends and major holidays. You have access to a nurse at all times for urgent questions. Please call the main number to the clinic [phone] and follow the prompts.  For any non-urgent questions, you may also contact your provider using MyChart. We now offer e-Visits for anyone 39 and older to request care online for non-urgent symptoms. For details visit [email or web address].   Also download the MyChart app! Go to the app store, search "MyChart", open the app, select [place], and log in with your MyChart username and password.

## 2023-12-29 NOTE — Progress Notes (Signed)
 Hematology and Oncology Follow Up Visit  Kelly Chase [id number] [birth year] 78 y.o. 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  So far, everything is going pretty well for her.  She really is tolerated treatment nicely.  When we last saw her, her monoclonal spike was 0.2 g/dL.  Her IgG  was 616 mg/dL.  Her kappa light chain was 0.6 mg/dL.  We did do a 24-hour urine on her.  This basically showed no monoclonal protein in her urine now.  Before we started treatment, she had 5420 mg/L of Kappa light chain.  I am just very impressed with how well she has responded.  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  I am not sure if this is from the chemotherapy.  She is on gabapentin  only at nighttime.  I told her to try the gabapentin  3 times a day.  This might help a little bit more.  She has had no change in bowel or bladder habits.  She has had no diarrhea or constipation.  There is no cough.  She has had no issues with rashes.  There has been no leg swelling.  She has had no bleeding.  Overall, I would have to say that her performance status is probably ECOG 1.    Wt Readings from Last 3 Encounters:  12/29/23 151 lb (68.5 kg)  12/01/23 152 lb 4.8 oz (69.1 kg)  11/03/23 153 lb (69.4 kg)     Medications:  Allergies as of 12/29/2023       Reactions   Celecoxib Nausea And Vomiting   Chlorthalidone Palpitations   Feldene [piroxicam] Other (See Comments)   abd burning   Esomeprazole Other (See Comments)   Joint pain        Medication List        Accurate as of December 29, 2023  9:47 AM. If you have any questions, ask your nurse or doctor.          acetaminophen  500  MG tablet Commonly known as: TYLENOL  Take 1,000 mg by mouth every 6 (six) hours as needed.   acyclovir  400 MG tablet Commonly known as: ZOVIRAX  TAKE 1 TABLET BY MOUTH TWICE A DAY   amLODipine  10 MG tablet Commonly known as: NORVASC  TAKE 1 TABLET(10 MG) BY MOUTH DAILY   cyclobenzaprine  5 MG tablet Commonly known as: FLEXERIL  Take 1 tablet (5 mg total) by mouth every 8 (eight) hours as needed for muscle spasms.   dexamethasone  4 MG tablet Commonly known as: DECADRON  Take 3 tablets (12 mg total) by mouth See admin instructions. Take 5 tablets the morning of your chemotherapy appointment before arriving to the clinic   gabapentin  100 MG capsule Commonly known as: NEURONTIN  Take by mouth at bedtime as needed.   lidocaine -prilocaine  cream Commonly known as: EMLA  Apply 1 Application topically as needed.   lisinopril  20 MG tablet Commonly known as: ZESTRIL  Take 1 tablet (20 mg total) by mouth daily.   ondansetron  4 MG disintegrating tablet Commonly known as: ZOFRAN -ODT Take 1 tablet (4 mg total) by mouth every 8 (eight) hours as needed for nausea or vomiting.   ondansetron  8 MG tablet Commonly known  as: Zofran  Take 8 mg by mouth 30 to 60 min prior to Cyclophosphamide  administration then take 8 mg every 8 hrs as needed for nausea and vomiting.   prochlorperazine  10 MG tablet Commonly known as: COMPAZINE  Take 1 tablet (10 mg total) by mouth every 6 (six) hours as needed for nausea or vomiting.   traZODone  50 MG tablet Commonly known as: DESYREL  TAKE 1 TABLET BY MOUTH EVERYDAY AT BEDTIME        Allergies:  Allergies  Allergen Reactions   Celecoxib Nausea And Vomiting   Chlorthalidone Palpitations   Feldene [Piroxicam] Other (See Comments)    abd burning   Esomeprazole Other (See Comments)    Joint pain    Past Medical History, Surgical history, Social history, and Family History were reviewed and updated.  Review of Systems: Review of Systems  Constitutional:  Negative.   HENT: Negative.    Eyes: Negative.   Respiratory: Negative.    Cardiovascular: Negative.   Gastrointestinal: Negative.   Genitourinary: Negative.   Musculoskeletal: Negative.   Skin: Negative.   Neurological: Negative.   Endo/Heme/Allergies: Negative.   Psychiatric/Behavioral: Negative.        Physical Exam:  weight is 151 lb (68.5 kg). Her oral temperature is 97.9 F (36.6 C). Her blood pressure is 127/72 and her pulse is 76. Her respiration is 18 and oxygen saturation is 100%.   Wt Readings from Last 3 Encounters:  12/29/23 151 lb (68.5 kg)  12/01/23 152 lb 4.8 oz (69.1 kg)  11/03/23 153 lb (69.4 kg)    Physical Exam Vitals reviewed.  HENT:     Head: Normocephalic and atraumatic.  Eyes:     Pupils: Pupils are equal, round, and reactive to light.  Cardiovascular:     Rate and Rhythm: Normal rate and regular rhythm.     Heart sounds: Normal heart sounds.  Pulmonary:     Effort: Pulmonary effort is normal.     Breath sounds: Normal breath sounds.  Abdominal:     General: Bowel sounds are normal.     Palpations: Abdomen is soft.  Musculoskeletal:        General: No swelling, tenderness or deformity. Normal range of motion.     Cervical back: Normal range of motion.     Left lower leg: No edema.     Comments: Non tender to palpation. No erythema, edema, or palpable bony abnormalities   Lymphadenopathy:     Cervical: No cervical adenopathy.  Skin:    General: Skin is warm and dry.     Findings: No erythema or rash.  Neurological:     Mental Status: She is alert and oriented to person, place, and time.  Psychiatric:        Behavior: Behavior normal.        Thought Content: Thought content normal.        Judgment: Judgment normal.      Lab Results  Component Value Date   WBC 3.8 (L) 12/29/2023   HGB 9.6 (L) 12/29/2023   HCT 28.2 (L) 12/29/2023   MCV 86.8 12/29/2023   PLT 230 12/29/2023   Lab Results  Component Value Date   FERRITIN 732 (H)  07/07/2023   IRON 49 07/07/2023   TIBC 342 07/07/2023   UIBC 293 07/07/2023   IRONPCTSAT 14 07/07/2023   Lab Results  Component Value Date   RETICCTPCT 2.1 07/07/2023   RBC 3.25 (L) 12/29/2023   Lab Results  Component Value Date   KPAFRELGTCHN  6.7 12/01/2023   LAMBDASER 3.2 (L) 12/01/2023   KAPLAMBRATIO 2.09 (H) 12/01/2023   Lab Results  Component Value Date   IGGSERUM 621 12/01/2023   IGGSERUM 613 12/01/2023   IGA 32 (L) 12/01/2023   IGA 32 (L) 12/01/2023   IGMSERUM 12 (L) 12/01/2023   IGMSERUM 13 (L) 12/01/2023   Lab Results  Component Value Date   TOTALPROTELP 5.6 (L) 12/01/2023   ALBUMINELP 3.8 12/01/2023   A1GS 0.2 12/01/2023   A2GS 0.4 12/01/2023   BETS 0.7 12/01/2023   BETA2SER 0.5 09/24/2014   GAMS 0.4 12/01/2023   MSPIKE 0.2 (H) 12/01/2023   SPEI Comment 06/09/2023     Chemistry      Component Value Date/Time   NA 138 12/01/2023 0948   NA 139 10/10/2015 0938   K 4.2 12/01/2023 0948   K 4.2 10/10/2015 0938   CL 105 12/01/2023 0948   CL 105 04/11/2015 0953   CO2 23 12/01/2023 0948   CO2 25 10/10/2015 0938   BUN 14 12/01/2023 0948   BUN 13.6 10/10/2015 0938   CREATININE 0.82 12/01/2023 0948   CREATININE 1.0 10/10/2015 0938      Component Value Date/Time   CALCIUM 9.1 12/01/2023 0948   CALCIUM 9.7 10/10/2015 0938   ALKPHOS 49 12/01/2023 0948   ALKPHOS 104 10/10/2015 0938   AST 17 12/01/2023 0948   AST 18 10/10/2015 0938   ALT 14 12/01/2023 0948   ALT 20 10/10/2015 0938   BILITOT 0.5 12/01/2023 0948   BILITOT 0.80 10/10/2015 0938     Impression and Plan: Ms. Bolding is a pleasant 78 yo African American female with progressive IgG kappa myeloma.   Again, she has responded very nicely to chemotherapy.  At this point, I may want to think about pulling back on her treatment somewhat.  We may be able to just go with Faspro.  She enjoys just being on the Faspro.  This really is making life a lot easier for her.  She decided to come in once a month  now.  We will have her come back in another month.  We will try to have to adjust her schedule so that we can work around [employer] season.  For the neuropathy, we may want to think about holding on the Velcade .   10/9/20259:47 AM

## 2023-12-30 ENCOUNTER — Other Ambulatory Visit: Payer: Self-pay

## 2023-12-30 LAB — KAPPA/LAMBDA LIGHT CHAINS
Kappa free light chain: 8.8 mg/L (ref 3.3–19.4)
Kappa, lambda light chain ratio: 1.52 (ref 0.26–1.65)
Lambda free light chains: 5.8 mg/L (ref 5.7–26.3)

## 2023-12-30 LAB — IGG, IGA, IGM
IgA: 41 mg/dL — ABNORMAL LOW (ref 64–422)
IgG (Immunoglobin G), Serum: 658 mg/dL (ref 586–1602)
IgM (Immunoglobulin M), Srm: 13 mg/dL — ABNORMAL LOW (ref 26–217)

## 2024-01-04 LAB — PROTEIN ELECTROPHORESIS, SERUM, WITH REFLEX
A/G Ratio: 1.7 (ref 0.7–1.7)
Albumin ELP: 3.8 g/dL (ref 2.9–4.4)
Alpha-1-Globulin: 0.3 g/dL (ref 0.0–0.4)
Alpha-2-Globulin: 0.5 g/dL (ref 0.4–1.0)
Beta Globulin: 0.9 g/dL (ref 0.7–1.3)
Gamma Globulin: 0.5 g/dL (ref 0.4–1.8)
Globulin, Total: 2.2 g/dL (ref 2.2–3.9)
M-Spike, %: 0.2 g/dL — ABNORMAL HIGH
SPEP Interpretation: 0
Total Protein ELP: 6 g/dL (ref 6.0–8.5)

## 2024-01-04 LAB — IMMUNOFIXATION REFLEX, SERUM
IgA: 40 mg/dL — ABNORMAL LOW (ref 64–422)
IgG (Immunoglobin G), Serum: 665 mg/dL (ref 586–1602)
IgM (Immunoglobulin M), Srm: 14 mg/dL — ABNORMAL LOW (ref 26–217)

## 2024-01-08 ENCOUNTER — Other Ambulatory Visit: Payer: Self-pay

## 2024-01-19 ENCOUNTER — Other Ambulatory Visit: Payer: Self-pay

## 2024-01-19 DIAGNOSIS — C9000 Multiple myeloma not having achieved remission: Secondary | ICD-10-CM

## 2024-01-19 MED ORDER — DEXAMETHASONE 4 MG PO TABS: 12.0000 mg | ORAL_TABLET | ORAL | 2 refills | Status: DC

## 2024-01-23 ENCOUNTER — Other Ambulatory Visit: Payer: Self-pay

## 2024-01-24 ENCOUNTER — Other Ambulatory Visit: Payer: Self-pay

## 2024-01-26 ENCOUNTER — Other Ambulatory Visit: Payer: Self-pay

## 2024-01-26 ENCOUNTER — Inpatient Hospital Stay

## 2024-01-26 ENCOUNTER — Inpatient Hospital Stay: Attending: Hematology & Oncology

## 2024-01-26 ENCOUNTER — Inpatient Hospital Stay: Admitting: Hematology & Oncology

## 2024-01-26 ENCOUNTER — Encounter: Payer: Self-pay | Admitting: Hematology & Oncology

## 2024-01-26 VITALS — BP 140/66 | HR 86 | Temp 98.1°F | Resp 18 | Ht 66.0 in | Wt 152.0 lb

## 2024-01-26 DIAGNOSIS — C9000 Multiple myeloma not having achieved remission: Secondary | ICD-10-CM

## 2024-01-26 DIAGNOSIS — Z5112 Encounter for antineoplastic immunotherapy: Secondary | ICD-10-CM | POA: Insufficient documentation

## 2024-01-26 LAB — CMP (CANCER CENTER ONLY)
ALT: 14 U/L (ref 0–44)
AST: 20 U/L (ref 15–41)
Albumin: 4.6 g/dL (ref 3.5–5.0)
Alkaline Phosphatase: 54 U/L (ref 38–126)
Anion gap: 12 (ref 5–15)
BUN: 14 mg/dL (ref 8–23)
CO2: 23 mmol/L (ref 22–32)
Calcium: 9.4 mg/dL (ref 8.9–10.3)
Chloride: 105 mmol/L (ref 98–111)
Creatinine: 0.86 mg/dL (ref 0.44–1.00)
GFR, Estimated: >60 mL/min (ref >60)
Glucose, Bld: 130 mg/dL — ABNORMAL HIGH (ref 70–99)
Potassium: 4.1 mmol/L (ref 3.5–5.1)
Sodium: 140 mmol/L (ref 135–145)
Total Bilirubin: 0.8 mg/dL (ref 0.0–1.2)
Total Protein: 6.6 g/dL (ref 6.5–8.1)

## 2024-01-26 LAB — CBC WITH DIFFERENTIAL (CANCER CENTER ONLY)
Abs Immature Granulocytes: 0.02 K/uL (ref 0.00–0.07)
Basophils Absolute: 0 K/uL (ref 0.0–0.1)
Basophils Relative: 0 %
Eosinophils Absolute: 0.1 K/uL (ref 0.0–0.5)
Eosinophils Relative: 1 %
HCT: 31.3 % — ABNORMAL LOW (ref 36.0–46.0)
Hemoglobin: 10.6 g/dL — ABNORMAL LOW (ref 12.0–15.0)
Immature Granulocytes: 0 %
Lymphocytes Relative: 16 %
Lymphs Abs: 0.7 K/uL (ref 0.7–4.0)
MCH: 29.4 pg (ref 26.0–34.0)
MCHC: 33.9 g/dL (ref 30.0–36.0)
MCV: 86.9 fL (ref 80.0–100.0)
Monocytes Absolute: 0.2 K/uL (ref 0.1–1.0)
Monocytes Relative: 5 %
Neutro Abs: 3.5 K/uL (ref 1.7–7.7)
Neutrophils Relative %: 78 %
Platelet Count: 340 K/uL (ref 150–400)
RBC: 3.6 MIL/uL — ABNORMAL LOW (ref 3.87–5.11)
RDW: 14.6 % (ref 11.5–15.5)
WBC Count: 4.5 K/uL (ref 4.0–10.5)
nRBC: 0 % (ref 0.0–0.2)

## 2024-01-26 LAB — LACTATE DEHYDROGENASE: LDH: 316 U/L — ABNORMAL HIGH (ref 98–192)

## 2024-01-26 MED ORDER — DEXAMETHASONE 4 MG PO TABS: 12.0000 mg | ORAL_TABLET | Freq: Once | ORAL | Status: DC

## 2024-01-26 MED ORDER — ACETAMINOPHEN 325 MG PO TABS: 650.0000 mg | ORAL_TABLET | Freq: Once | ORAL | Status: DC

## 2024-01-26 MED ORDER — DARATUMUMAB-HYALURONIDASE-FIHJ 1800-30000 MG-UT/15ML ~~LOC~~ SOLN
1800.0000 mg | Freq: Once | SUBCUTANEOUS | Status: AC
  Administered 2024-01-26: 1800 mg via SUBCUTANEOUS
  Filled 2024-01-26: qty 15

## 2024-01-26 MED ORDER — DIPHENHYDRAMINE HCL 25 MG PO CAPS: 50.0000 mg | ORAL_CAPSULE | Freq: Once | ORAL | Status: DC

## 2024-01-26 NOTE — Progress Notes (Signed)
 Hematology and Oncology Follow Up Visit  Kelly Chase [id number] 1945/10/01 78 y.o. 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  She really is doing quite nicely.  I have to give her a lot of credit for the toughness that she is shown.  She is getting ready for Thanksgiving.  It sounds like she will be having some family over.  When we last saw her, her monoclonal spike was 0.2 g/dL.  Her IgG level was 665 mg/dL.  The Kappa light chain was 0.9 mg/dL.  She has had no problems with pain.  She has had no problems with bowels or bladder.  She has had no rashes.  There is been no bleeding.  There is been no leg swelling.  Overall, I will say that her performance status is probably ECOG 1.    Wt Readings from Last 3 Encounters:  01/26/24 152 lb (68.9 kg)  12/29/23 151 lb (68.5 kg)  12/01/23 152 lb 4.8 oz (69.1 kg)     Medications:  Allergies as of 01/26/2024       Reactions   Celecoxib Nausea And Vomiting   Chlorthalidone Palpitations   Feldene [piroxicam] Other (See Comments)   abd burning   Esomeprazole Other (See Comments)   Joint pain        Medication List        Accurate as of January 26, 2024 11:08 AM. If you have any questions, ask your nurse or doctor.          acetaminophen  500 MG tablet Commonly known as: TYLENOL  Take 1,000 mg by mouth every 6 (six) hours as needed.   acyclovir  400 MG tablet Commonly known as: ZOVIRAX  TAKE 1 TABLET BY MOUTH TWICE A DAY   amLODipine  10 MG tablet Commonly known as: NORVASC  TAKE 1 TABLET(10 MG) BY MOUTH DAILY   cyclobenzaprine  5 MG tablet Commonly known as: FLEXERIL  Take 1 tablet (5 mg total) by mouth every 8  (eight) hours as needed for muscle spasms.   dexamethasone  4 MG tablet Commonly known as: DECADRON  Take 3 tablets (12 mg total) by mouth See admin instructions. Take 3 tablets the morning of your chemotherapy appointment before arriving to the clinic   gabapentin  100 MG capsule Commonly known as: NEURONTIN  Take by mouth at bedtime as needed.   lidocaine -prilocaine  cream Commonly known as: EMLA  Apply 1 Application topically as needed.   lisinopril  20 MG tablet Commonly known as: ZESTRIL  Take 1 tablet (20 mg total) by mouth daily.   ondansetron  4 MG disintegrating tablet Commonly known as: ZOFRAN -ODT Take 1 tablet (4 mg total) by mouth every 8 (eight) hours as needed for nausea or vomiting.   ondansetron  8 MG tablet Commonly known as: Zofran  Take 8 mg by mouth 30 to 60 min prior to Cyclophosphamide  administration then take 8 mg every 8 hrs as needed for nausea and vomiting.   prochlorperazine  10 MG tablet Commonly known as: COMPAZINE  Take 1 tablet (10 mg total) by mouth every 6 (six) hours as needed for nausea or vomiting.   traZODone  50 MG tablet Commonly known as: DESYREL  TAKE 1 TABLET BY MOUTH EVERYDAY AT BEDTIME  Allergies:  Allergies  Allergen Reactions   Celecoxib Nausea And Vomiting   Chlorthalidone Palpitations   Feldene [Piroxicam] Other (See Comments)    abd burning   Esomeprazole Other (See Comments)    Joint pain    Past Medical History, Surgical history, Social history, and Family History were reviewed and updated.  Review of Systems: Review of Systems  Constitutional: Negative.   HENT: Negative.    Eyes: Negative.   Respiratory: Negative.    Cardiovascular: Negative.   Gastrointestinal: Negative.   Genitourinary: Negative.   Musculoskeletal: Negative.   Skin: Negative.   Neurological: Negative.   Endo/Heme/Allergies: Negative.   Psychiatric/Behavioral: Negative.        Physical Exam:  height is 5' 6 (1.676 m) and weight is 152  lb (68.9 kg). Her oral temperature is 98.1 F (36.7 C). Her blood pressure is 140/66 (abnormal) and her pulse is 86. Her respiration is 18 and oxygen saturation is 100%.   Wt Readings from Last 3 Encounters:  01/26/24 152 lb (68.9 kg)  12/29/23 151 lb (68.5 kg)  12/01/23 152 lb 4.8 oz (69.1 kg)    Physical Exam Vitals reviewed.  HENT:     Head: Normocephalic and atraumatic.  Eyes:     Pupils: Pupils are equal, round, and reactive to light.  Cardiovascular:     Rate and Rhythm: Normal rate and regular rhythm.     Heart sounds: Normal heart sounds.  Pulmonary:     Effort: Pulmonary effort is normal.     Breath sounds: Normal breath sounds.  Abdominal:     General: Bowel sounds are normal.     Palpations: Abdomen is soft.  Musculoskeletal:        General: No swelling, tenderness or deformity. Normal range of motion.     Cervical back: Normal range of motion.     Left lower leg: No edema.     Comments: Non tender to palpation. No erythema, edema, or palpable bony abnormalities   Lymphadenopathy:     Cervical: No cervical adenopathy.  Skin:    General: Skin is warm and dry.     Findings: No erythema or rash.  Neurological:     Mental Status: She is alert and oriented to person, place, and time.  Psychiatric:        Behavior: Behavior normal.        Thought Content: Thought content normal.        Judgment: Judgment normal.      Lab Results  Component Value Date   WBC 4.5 01/26/2024   HGB 10.6 (L) 01/26/2024   HCT 31.3 (L) 01/26/2024   MCV 86.9 01/26/2024   PLT 340 01/26/2024   Lab Results  Component Value Date   FERRITIN 732 (H) 07/07/2023   IRON 49 07/07/2023   TIBC 342 07/07/2023   UIBC 293 07/07/2023   IRONPCTSAT 14 07/07/2023   Lab Results  Component Value Date   RETICCTPCT 2.1 07/07/2023   RBC 3.60 (L) 01/26/2024   Lab Results  Component Value Date   KPAFRELGTCHN 8.8 12/29/2023   LAMBDASER 5.8 12/29/2023   KAPLAMBRATIO 1.52 12/29/2023   Lab  Results  Component Value Date   IGGSERUM 665 12/29/2023   IGA 40 (L) 12/29/2023   IGMSERUM 14 (L) 12/29/2023   Lab Results  Component Value Date   TOTALPROTELP 6.0 12/29/2023   ALBUMINELP 3.8 12/29/2023   A1GS 0.3 12/29/2023   A2GS 0.5 12/29/2023   BETS 0.9 12/29/2023   BETA2SER 0.5  09/24/2014   GAMS 0.5 12/29/2023   MSPIKE 0.2 (H) 12/29/2023   SPEI Comment 06/09/2023     Chemistry      Component Value Date/Time   NA 140 01/26/2024 0918   NA 139 10/10/2015 0938   K 4.1 01/26/2024 0918   K 4.2 10/10/2015 0938   CL 105 01/26/2024 0918   CL 105 04/11/2015 0953   CO2 23 01/26/2024 0918   CO2 25 10/10/2015 0938   BUN 14 01/26/2024 0918   BUN 13.6 10/10/2015 0938   CREATININE 0.86 01/26/2024 0918   CREATININE 1.0 10/10/2015 0938      Component Value Date/Time   CALCIUM 9.4 01/26/2024 0918   CALCIUM 9.7 10/10/2015 0938   ALKPHOS 54 01/26/2024 0918   ALKPHOS 104 10/10/2015 0938   AST 20 01/26/2024 0918   AST 18 10/10/2015 0938   ALT 14 01/26/2024 0918   ALT 20 10/10/2015 0938   BILITOT 0.8 01/26/2024 0918   BILITOT 0.80 10/10/2015 0938     Impression and Plan: Ms. Federer is a pleasant 78 yo African American female with progressive IgG kappa myeloma.   Again, she has responded very nicely to chemotherapy.  At this point, I may want to think about pulling back on her treatment somewhat.  We may be able to just go with Faspro.  She enjoys just being on the Faspro.  This really is making life a lot easier for her.    We will have her come back in another month.  We will try to have to adjust her schedule so that we can work around [employer] season.    11/6/202511:08 AM

## 2024-01-26 NOTE — Patient Instructions (Signed)
 [facility] - A DEPT OF [facility]  Discharge Instructions: Thank you for choosing [facility] to provide your oncology and hematology care.   If you have a lab appointment with the Cancer Center, please go directly to the Cancer Center and check in at the registration area.  Wear comfortable clothing and clothing appropriate for easy access to any Portacath or PICC line.   We strive to give you quality time with your provider. You may need to reschedule your appointment if you arrive late (15 or more minutes).  Arriving late affects you and other patients whose appointments are after yours.  Also, if you miss three or more appointments without notifying the office, you may be dismissed from the clinic at the provider's discretion.      For prescription refill requests, have your pharmacy contact our office and allow 72 hours for refills to be completed.    Today you received the following chemotherapy and/or immunotherapy agents:  Faspro      To help prevent nausea and vomiting after your treatment, we encourage you to take your nausea medication as directed.  BELOW ARE SYMPTOMS THAT SHOULD BE REPORTED IMMEDIATELY: *FEVER GREATER THAN 100.4 F (38 C) OR HIGHER *CHILLS OR SWEATING *NAUSEA AND VOMITING THAT IS NOT CONTROLLED WITH YOUR NAUSEA MEDICATION *UNUSUAL SHORTNESS OF BREATH *UNUSUAL BRUISING OR BLEEDING *URINARY PROBLEMS (pain or burning when urinating, or frequent urination) *BOWEL PROBLEMS (unusual diarrhea, constipation, pain near the anus) TENDERNESS IN MOUTH AND THROAT WITH OR WITHOUT PRESENCE OF ULCERS (sore throat, sores in mouth, or a toothache) UNUSUAL RASH, SWELLING OR PAIN  UNUSUAL VAGINAL DISCHARGE OR ITCHING   Items with * indicate a potential emergency and should be followed up as soon as possible or go to the Emergency Department if any problems should occur.  Please show the CHEMOTHERAPY ALERT CARD or IMMUNOTHERAPY  ALERT CARD at check-in to the Emergency Department and triage nurse. Should you have questions after your visit or need to cancel or reschedule your appointment, please contact [facility] - A DEPT OF [facility]  [phone] and follow the prompts.  Office hours are 8:00 a.m. to 4:30 p.m. Monday - Friday. Please note that voicemails left after 4:00 p.m. may not be returned until the following business day.  We are closed weekends and major holidays. You have access to a nurse at all times for urgent questions. Please call the main number to the clinic [phone] and follow the prompts.  For any non-urgent questions, you may also contact your provider using MyChart. We now offer e-Visits for anyone 66 and older to request care online for non-urgent symptoms. For details visit [email or web address].   Also download the MyChart app! Go to the app store, search "MyChart", open the app, select [place], and log in with your MyChart username and password.

## 2024-01-26 NOTE — Patient Instructions (Signed)

## 2024-01-27 LAB — IGG, IGA, IGM
IgA: 60 mg/dL — ABNORMAL LOW (ref 64–422)
IgG (Immunoglobin G), Serum: 764 mg/dL (ref 586–1602)
IgM (Immunoglobulin M), Srm: 15 mg/dL — ABNORMAL LOW (ref 26–217)

## 2024-01-27 LAB — KAPPA/LAMBDA LIGHT CHAINS
Kappa free light chain: 8.4 mg/L (ref 3.3–19.4)
Kappa, lambda light chain ratio: 1.38 (ref 0.26–1.65)
Lambda free light chains: 6.1 mg/L (ref 5.7–26.3)

## 2024-02-02 ENCOUNTER — Other Ambulatory Visit: Payer: Self-pay

## 2024-02-02 LAB — IMMUNOFIXATION REFLEX, SERUM
IgA: 67 mg/dL (ref 64–422)
IgG (Immunoglobin G), Serum: 862 mg/dL (ref 586–1602)
IgM (Immunoglobulin M), Srm: 16 mg/dL — ABNORMAL LOW (ref 26–217)

## 2024-02-02 LAB — PROTEIN ELECTROPHORESIS, SERUM, WITH REFLEX
A/G Ratio: 1.6 (ref 0.7–1.7)
Albumin ELP: 4 g/dL (ref 2.9–4.4)
Alpha-1-Globulin: 0.3 g/dL (ref 0.0–0.4)
Alpha-2-Globulin: 0.6 g/dL (ref 0.4–1.0)
Beta Globulin: 1 g/dL (ref 0.7–1.3)
Gamma Globulin: 0.7 g/dL (ref 0.4–1.8)
Globulin, Total: 2.5 g/dL (ref 2.2–3.9)
M-Spike, %: 0.2 g/dL — ABNORMAL HIGH
SPEP Interpretation: 0
Total Protein ELP: 6.5 g/dL (ref 6.0–8.5)

## 2024-02-23 ENCOUNTER — Other Ambulatory Visit: Payer: Self-pay

## 2024-02-23 ENCOUNTER — Inpatient Hospital Stay

## 2024-02-23 ENCOUNTER — Inpatient Hospital Stay: Admitting: Hematology & Oncology

## 2024-02-23 ENCOUNTER — Inpatient Hospital Stay: Attending: Hematology & Oncology

## 2024-02-23 ENCOUNTER — Encounter: Payer: Self-pay | Admitting: Hematology & Oncology

## 2024-02-23 VITALS — BP 128/67 | HR 80 | Temp 98.0°F | Resp 18

## 2024-02-23 VITALS — BP 144/71 | HR 78 | Temp 97.9°F | Resp 18 | Ht 66.0 in | Wt 154.0 lb

## 2024-02-23 DIAGNOSIS — C9000 Multiple myeloma not having achieved remission: Secondary | ICD-10-CM

## 2024-02-23 DIAGNOSIS — Z5112 Encounter for antineoplastic immunotherapy: Secondary | ICD-10-CM | POA: Insufficient documentation

## 2024-02-23 DIAGNOSIS — M898X9 Other specified disorders of bone, unspecified site: Secondary | ICD-10-CM

## 2024-02-23 LAB — CBC WITH DIFFERENTIAL (CANCER CENTER ONLY)
Abs Immature Granulocytes: 0.01 K/uL (ref 0.00–0.07)
Basophils Absolute: 0 K/uL (ref 0.0–0.1)
Basophils Relative: 1 %
Eosinophils Absolute: 0 K/uL (ref 0.0–0.5)
Eosinophils Relative: 1 %
HCT: 29.8 % — ABNORMAL LOW (ref 36.0–46.0)
Hemoglobin: 10.1 g/dL — ABNORMAL LOW (ref 12.0–15.0)
Immature Granulocytes: 0 %
Lymphocytes Relative: 17 %
Lymphs Abs: 0.7 K/uL (ref 0.7–4.0)
MCH: 29.4 pg (ref 26.0–34.0)
MCHC: 33.9 g/dL (ref 30.0–36.0)
MCV: 86.6 fL (ref 80.0–100.0)
Monocytes Absolute: 0.3 K/uL (ref 0.1–1.0)
Monocytes Relative: 7 %
Neutro Abs: 3 K/uL (ref 1.7–7.7)
Neutrophils Relative %: 74 %
Platelet Count: 211 K/uL (ref 150–400)
RBC: 3.44 MIL/uL — ABNORMAL LOW (ref 3.87–5.11)
RDW: 14.6 % (ref 11.5–15.5)
WBC Count: 4 K/uL (ref 4.0–10.5)
nRBC: 0 % (ref 0.0–0.2)

## 2024-02-23 LAB — CMP (CANCER CENTER ONLY)
ALT: 13 U/L (ref 0–44)
AST: 18 U/L (ref 15–41)
Albumin: 4.4 g/dL (ref 3.5–5.0)
Alkaline Phosphatase: 44 U/L (ref 38–126)
Anion gap: 13 (ref 5–15)
BUN: 15 mg/dL (ref 8–23)
CO2: 21 mmol/L — ABNORMAL LOW (ref 22–32)
Calcium: 9 mg/dL (ref 8.9–10.3)
Chloride: 106 mmol/L (ref 98–111)
Creatinine: 0.81 mg/dL (ref 0.44–1.00)
GFR, Estimated: >60 mL/min (ref >60)
Glucose, Bld: 134 mg/dL — ABNORMAL HIGH (ref 70–99)
Potassium: 3.9 mmol/L (ref 3.5–5.1)
Sodium: 139 mmol/L (ref 135–145)
Total Bilirubin: 0.6 mg/dL (ref 0.0–1.2)
Total Protein: 6.4 g/dL — ABNORMAL LOW (ref 6.5–8.1)

## 2024-02-23 LAB — LACTATE DEHYDROGENASE: LDH: 272 U/L — ABNORMAL HIGH (ref 105–235)

## 2024-02-23 MED ORDER — ACETAMINOPHEN 325 MG PO TABS: 650.0000 mg | ORAL_TABLET | Freq: Once | ORAL | Status: DC

## 2024-02-23 MED ORDER — DEXAMETHASONE 4 MG PO TABS: 12.0000 mg | ORAL_TABLET | Freq: Once | ORAL | Status: DC

## 2024-02-23 MED ORDER — SODIUM CHLORIDE 0.9 % IV SOLN: INTRAVENOUS | Status: DC

## 2024-02-23 MED ORDER — LIDOCAINE-PRILOCAINE 2.5-2.5 % EX CREA: 1.0000 | TOPICAL_CREAM | CUTANEOUS | 0 refills | Status: AC | PRN

## 2024-02-23 MED ORDER — ZOLEDRONIC ACID 4 MG/5ML IV CONC
3.5000 mg | Freq: Once | INTRAVENOUS | Status: AC
  Administered 2024-02-23: 3.5 mg via INTRAVENOUS
  Filled 2024-02-23: qty 4.38

## 2024-02-23 MED ORDER — DARATUMUMAB-HYALURONIDASE-FIHJ 1800-30000 MG-UT/15ML ~~LOC~~ SOLN
1800.0000 mg | Freq: Once | SUBCUTANEOUS | Status: AC
  Administered 2024-02-23: 1800 mg via SUBCUTANEOUS
  Filled 2024-02-23: qty 15

## 2024-02-23 MED ORDER — DIPHENHYDRAMINE HCL 25 MG PO CAPS: 50.0000 mg | ORAL_CAPSULE | Freq: Once | ORAL | Status: DC

## 2024-02-23 NOTE — Patient Instructions (Signed)

## 2024-02-23 NOTE — Progress Notes (Signed)
 Hematology and Oncology Follow Up Visit  Kelly Chase [id number] [birth year] 78 y.o. 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 Kelly Chase is here today for follow-up.  She is doing quite nicely.  She had a very nice Thanksgiving.  She is with family.  Also happy that she has done so well with her treatment.  Her myeloma levels have been nice and low and stable.  When we last saw her, the monoclonal spike was 0.2 g/dL.  Her IgG level 810 mg/dL.  The Kappa light chain was 0.8 mg/dL.  She has had no problems with bowels or bladder.  There has been no bleeding.  She has had no cough or shortness of breath.  There has been no leg swelling.  She has had no pain.  Overall, I would say that her performance status is probably ECOG 1.   Wt Readings from Last 3 Encounters:  02/23/24 154 lb (69.9 kg)  01/26/24 152 lb (68.9 kg)  12/29/23 151 lb (68.5 kg)     Medications:  Allergies as of 02/23/2024       Reactions   Celecoxib Nausea And Vomiting   Chlorthalidone Palpitations   Feldene [piroxicam] Other (See Comments)   abd burning   Esomeprazole Other (See Comments)   Joint pain        Medication List        Accurate as of February 23, 2024 10:23 AM. If you have any questions, ask your nurse or doctor.          acetaminophen  500 MG tablet Commonly known as: TYLENOL  Take 1,000 mg by mouth every 6 (six) hours as needed.   acyclovir  400 MG tablet Commonly known as: ZOVIRAX  TAKE 1 TABLET BY MOUTH TWICE A DAY   amLODipine  10 MG tablet Commonly known as: NORVASC  TAKE 1 TABLET(10 MG) BY MOUTH DAILY   cyclobenzaprine  5 MG tablet Commonly known as: FLEXERIL  Take 1 tablet (5 mg total) by mouth every 8  (eight) hours as needed for muscle spasms.   dexamethasone  4 MG tablet Commonly known as: DECADRON  Take 3 tablets (12 mg total) by mouth See admin instructions. Take 3 tablets the morning of your chemotherapy appointment before arriving to the clinic   gabapentin  100 MG capsule Commonly known as: NEURONTIN  Take by mouth at bedtime as needed.   lidocaine -prilocaine  cream Commonly known as: EMLA  Apply 1 Application topically as needed.   lisinopril  20 MG tablet Commonly known as: ZESTRIL  Take 1 tablet (20 mg total) by mouth daily.   ondansetron  4 MG disintegrating tablet Commonly known as: ZOFRAN -ODT Take 1 tablet (4 mg total) by mouth every 8 (eight) hours as needed for nausea or vomiting.   ondansetron  8 MG tablet Commonly known as: Zofran  Take 8 mg by mouth 30 to 60 min prior to Cyclophosphamide  administration then take 8 mg every 8 hrs as needed for nausea and vomiting.   prochlorperazine  10 MG tablet Commonly known as: COMPAZINE  Take 1 tablet (10 mg total) by mouth every 6 (six) hours as needed for nausea or vomiting.   traZODone  50 MG tablet Commonly known as: DESYREL  TAKE 1 TABLET BY MOUTH EVERYDAY AT BEDTIME  Allergies:  Allergies  Allergen Reactions   Celecoxib Nausea And Vomiting   Chlorthalidone Palpitations   Feldene [Piroxicam] Other (See Comments)    abd burning   Esomeprazole Other (See Comments)    Joint pain    Past Medical History, Surgical history, Social history, and Family History were reviewed and updated.  Review of Systems: Review of Systems  Constitutional: Negative.   HENT: Negative.    Eyes: Negative.   Respiratory: Negative.    Cardiovascular: Negative.   Gastrointestinal: Negative.   Genitourinary: Negative.   Musculoskeletal: Negative.   Skin: Negative.   Neurological: Negative.   Endo/Heme/Allergies: Negative.   Psychiatric/Behavioral: Negative.        Physical Exam:  height is 5' 6 (1.676 m) and weight is 154  lb (69.9 kg). Her oral temperature is 97.9 F (36.6 C). Her blood pressure is 144/71 (abnormal) and her pulse is 78. Her respiration is 18 and oxygen saturation is 100%.   Wt Readings from Last 3 Encounters:  02/23/24 154 lb (69.9 kg)  01/26/24 152 lb (68.9 kg)  12/29/23 151 lb (68.5 kg)    Physical Exam Vitals reviewed.  HENT:     Head: Normocephalic and atraumatic.  Eyes:     Pupils: Pupils are equal, round, and reactive to light.  Cardiovascular:     Rate and Rhythm: Normal rate and regular rhythm.     Heart sounds: Normal heart sounds.  Pulmonary:     Effort: Pulmonary effort is normal.     Breath sounds: Normal breath sounds.  Abdominal:     General: Bowel sounds are normal.     Palpations: Abdomen is soft.  Musculoskeletal:        General: No swelling, tenderness or deformity. Normal range of motion.     Cervical back: Normal range of motion.     Left lower leg: No edema.     Comments: Non tender to palpation. No erythema, edema, or palpable bony abnormalities   Lymphadenopathy:     Cervical: No cervical adenopathy.  Skin:    General: Skin is warm and dry.     Findings: No erythema or rash.  Neurological:     Mental Status: She is alert and oriented to person, place, and time.  Psychiatric:        Behavior: Behavior normal.        Thought Content: Thought content normal.        Judgment: Judgment normal.      Lab Results  Component Value Date   WBC 4.0 02/23/2024   HGB 10.1 (L) 02/23/2024   HCT 29.8 (L) 02/23/2024   MCV 86.6 02/23/2024   PLT 211 02/23/2024   Lab Results  Component Value Date   FERRITIN 732 (H) 07/07/2023   IRON 49 07/07/2023   TIBC 342 07/07/2023   UIBC 293 07/07/2023   IRONPCTSAT 14 07/07/2023   Lab Results  Component Value Date   RETICCTPCT 2.1 07/07/2023   RBC 3.44 (L) 02/23/2024   Lab Results  Component Value Date   KPAFRELGTCHN 8.4 01/26/2024   LAMBDASER 6.1 01/26/2024   KAPLAMBRATIO 1.38 01/26/2024   Lab Results   Component Value Date   IGGSERUM 764 01/26/2024   IGGSERUM 862 01/26/2024   IGA 60 (L) 01/26/2024   IGA 67 01/26/2024   IGMSERUM 15 (L) 01/26/2024   IGMSERUM 16 (L) 01/26/2024   Lab Results  Component Value Date   TOTALPROTELP 6.5 01/26/2024   ALBUMINELP 4.0 01/26/2024   A1GS 0.3 01/26/2024  A2GS 0.6 01/26/2024   BETS 1.0 01/26/2024   BETA2SER 0.5 09/24/2014   GAMS 0.7 01/26/2024   MSPIKE 0.2 (H) 01/26/2024   SPEI Comment 06/09/2023     Chemistry      Component Value Date/Time   NA 139 02/23/2024 0909   NA 139 10/10/2015 0938   K 3.9 02/23/2024 0909   K 4.2 10/10/2015 0938   CL 106 02/23/2024 0909   CL 105 04/11/2015 0953   CO2 21 (L) 02/23/2024 0909   CO2 25 10/10/2015 0938   BUN 15 02/23/2024 0909   BUN 13.6 10/10/2015 0938   CREATININE 0.81 02/23/2024 0909   CREATININE 1.0 10/10/2015 0938      Component Value Date/Time   CALCIUM 9.0 02/23/2024 0909   CALCIUM 9.7 10/10/2015 0938   ALKPHOS 44 02/23/2024 0909   ALKPHOS 104 10/10/2015 0938   AST 18 02/23/2024 0909   AST 18 10/10/2015 0938   ALT 13 02/23/2024 0909   ALT 20 10/10/2015 0938   BILITOT 0.6 02/23/2024 0909   BILITOT 0.80 10/10/2015 0938     Impression and Plan: Ms. Direnzo is a pleasant 78 yo African American female with progressive IgG kappa myeloma.   She currently is on Faspro.  I think that as long as the levels are this low, she was should never have a problem.  We will go ahead with Faspro today.  She will also get her Zometa  today.  We will now plan to get her back after the Chrismon to New Year holiday.  Again, she has responded very nicely to chemotherapy.  At this point, I may want to think about pulling back on her treatment somewhat.  We may be able to just go with Faspro.  She enjoys just being on the Faspro.  This really is making life a lot easier for her.    We will have her come back in another month.  We will try to have to adjust her schedule so that we can work around [employer] season.    12/4/202510:23 AM

## 2024-02-24 ENCOUNTER — Other Ambulatory Visit: Payer: Self-pay

## 2024-02-24 LAB — KAPPA/LAMBDA LIGHT CHAINS
Kappa free light chain: 8.8 mg/L (ref 3.3–19.4)
Kappa, lambda light chain ratio: 1.54 (ref 0.26–1.65)
Lambda free light chains: 5.7 mg/L (ref 5.7–26.3)

## 2024-02-24 LAB — IGG, IGA, IGM
IgA: 51 mg/dL — ABNORMAL LOW (ref 64–422)
IgG (Immunoglobin G), Serum: 722 mg/dL (ref 586–1602)
IgM (Immunoglobulin M), Srm: 16 mg/dL — ABNORMAL LOW (ref 26–217)

## 2024-02-28 ENCOUNTER — Other Ambulatory Visit: Payer: Self-pay

## 2024-02-28 DIAGNOSIS — C9000 Multiple myeloma not having achieved remission: Secondary | ICD-10-CM

## 2024-02-28 MED ORDER — DEXAMETHASONE 4 MG PO TABS: 12.0000 mg | ORAL_TABLET | ORAL | 2 refills | Status: AC

## 2024-03-01 LAB — IMMUNOFIXATION REFLEX, SERUM
IgA: 56 mg/dL — ABNORMAL LOW (ref 64–422)
IgG (Immunoglobin G), Serum: 751 mg/dL (ref 586–1602)
IgM (Immunoglobulin M), Srm: 17 mg/dL — ABNORMAL LOW (ref 26–217)

## 2024-03-01 LAB — PROTEIN ELECTROPHORESIS, SERUM, WITH REFLEX
A/G Ratio: 1.8 — ABNORMAL HIGH (ref 0.7–1.7)
Albumin ELP: 3.9 g/dL (ref 2.9–4.4)
Alpha-1-Globulin: 0.2 g/dL (ref 0.0–0.4)
Alpha-2-Globulin: 0.5 g/dL (ref 0.4–1.0)
Beta Globulin: 0.9 g/dL (ref 0.7–1.3)
Gamma Globulin: 0.6 g/dL (ref 0.4–1.8)
Globulin, Total: 2.2 g/dL (ref 2.2–3.9)
M-Spike, %: 0.2 g/dL — ABNORMAL HIGH
SPEP Interpretation: 0
Total Protein ELP: 6.1 g/dL (ref 6.0–8.5)

## 2024-03-06 ENCOUNTER — Other Ambulatory Visit: Payer: Self-pay

## 2024-03-10 ENCOUNTER — Other Ambulatory Visit: Payer: Self-pay

## 2024-03-21 ENCOUNTER — Other Ambulatory Visit: Payer: Self-pay

## 2024-03-23 ENCOUNTER — Inpatient Hospital Stay: Attending: Hematology & Oncology

## 2024-03-23 ENCOUNTER — Inpatient Hospital Stay

## 2024-03-23 ENCOUNTER — Inpatient Hospital Stay (HOSPITAL_BASED_OUTPATIENT_CLINIC_OR_DEPARTMENT_OTHER): Admitting: Hematology & Oncology

## 2024-03-23 ENCOUNTER — Encounter: Payer: Self-pay | Admitting: Hematology & Oncology

## 2024-03-23 VITALS — BP 127/67 | HR 80 | Temp 98.5°F | Resp 20 | Ht 66.0 in | Wt 155.2 lb

## 2024-03-23 DIAGNOSIS — C9000 Multiple myeloma not having achieved remission: Secondary | ICD-10-CM

## 2024-03-23 DIAGNOSIS — Z5112 Encounter for antineoplastic immunotherapy: Secondary | ICD-10-CM | POA: Diagnosis present

## 2024-03-23 LAB — CMP (CANCER CENTER ONLY)
ALT: 17 U/L (ref 0–44)
AST: 18 U/L (ref 15–41)
Albumin: 4.3 g/dL (ref 3.5–5.0)
Alkaline Phosphatase: 51 U/L (ref 38–126)
Anion gap: 11 (ref 5–15)
BUN: 16 mg/dL (ref 8–23)
CO2: 22 mmol/L (ref 22–32)
Calcium: 8.9 mg/dL (ref 8.9–10.3)
Chloride: 108 mmol/L (ref 98–111)
Creatinine: 0.89 mg/dL (ref 0.44–1.00)
GFR, Estimated: >60 mL/min
Glucose, Bld: 100 mg/dL — ABNORMAL HIGH (ref 70–99)
Potassium: 3.7 mmol/L (ref 3.5–5.1)
Sodium: 141 mmol/L (ref 135–145)
Total Bilirubin: 0.7 mg/dL (ref 0.0–1.2)
Total Protein: 6.4 g/dL — ABNORMAL LOW (ref 6.5–8.1)

## 2024-03-23 LAB — IRON AND IRON BINDING CAPACITY (CC-WL,HP ONLY)
Iron: 73 ug/dL (ref 28–170)
Saturation Ratios: 22 % (ref 10.4–31.8)
TIBC: 333 ug/dL (ref 250–450)
UIBC: 260 ug/dL

## 2024-03-23 LAB — CBC WITH DIFFERENTIAL (CANCER CENTER ONLY)
Abs Immature Granulocytes: 0.01 K/uL (ref 0.00–0.07)
Basophils Absolute: 0 K/uL (ref 0.0–0.1)
Basophils Relative: 1 %
Eosinophils Absolute: 0 K/uL (ref 0.0–0.5)
Eosinophils Relative: 1 %
HCT: 30.5 % — ABNORMAL LOW (ref 36.0–46.0)
Hemoglobin: 10.4 g/dL — ABNORMAL LOW (ref 12.0–15.0)
Immature Granulocytes: 0 %
Lymphocytes Relative: 21 %
Lymphs Abs: 0.8 K/uL (ref 0.7–4.0)
MCH: 29.4 pg (ref 26.0–34.0)
MCHC: 34.1 g/dL (ref 30.0–36.0)
MCV: 86.2 fL (ref 80.0–100.0)
Monocytes Absolute: 0.4 K/uL (ref 0.1–1.0)
Monocytes Relative: 10 %
Neutro Abs: 2.5 K/uL (ref 1.7–7.7)
Neutrophils Relative %: 67 %
Platelet Count: 354 K/uL (ref 150–400)
RBC: 3.54 MIL/uL — ABNORMAL LOW (ref 3.87–5.11)
RDW: 15 % (ref 11.5–15.5)
WBC Count: 3.8 K/uL — ABNORMAL LOW (ref 4.0–10.5)
nRBC: 0 % (ref 0.0–0.2)

## 2024-03-23 LAB — FERRITIN: Ferritin: 452 ng/mL — ABNORMAL HIGH (ref 11–307)

## 2024-03-23 MED ORDER — DEXAMETHASONE 4 MG PO TABS: 12.0000 mg | ORAL_TABLET | Freq: Once | ORAL | Status: DC

## 2024-03-23 MED ORDER — ACETAMINOPHEN 325 MG PO TABS: 650.0000 mg | ORAL_TABLET | Freq: Once | ORAL | Status: DC

## 2024-03-23 MED ORDER — DIPHENHYDRAMINE HCL 25 MG PO CAPS: 50.0000 mg | ORAL_CAPSULE | Freq: Once | ORAL | Status: DC

## 2024-03-23 MED ORDER — DARATUMUMAB-HYALURONIDASE-FIHJ 1800-30000 MG-UT/15ML ~~LOC~~ SOLN
1800.0000 mg | Freq: Once | SUBCUTANEOUS | Status: AC
  Administered 2024-03-23: 1800 mg via SUBCUTANEOUS
  Filled 2024-03-23: qty 15

## 2024-03-23 NOTE — Patient Instructions (Signed)
 Daratumumab  Injection What is this medication? DARATUMUMAB  (dar a toom ue mab) treats multiple myeloma, a type of bone marrow cancer. It works by helping your immune system slow or stop the spread of cancer cells. It is a monoclonal antibody. This medicine may be used for other purposes; ask your health care provider or pharmacist if you have questions. COMMON BRAND NAME(S): DARZALEX  What should I tell my care team before I take this medication? They need to know if you have any of these conditions: Hereditary fructose intolerance Infection, such as chickenpox, herpes, hepatitis B Lung or breathing disease, such as asthma, COPD An unusual or allergic reaction to daratumumab , sorbitol, other medications, foods, dyes, or preservatives Pregnant or trying to get pregnant Breastfeeding How should I use this medication? This medication is injected into a vein. It is given by your care team in a hospital or clinic setting. Talk to your care team about the use of this medication in children. Special care may be needed. Overdosage: If you think you have taken too much of this medicine contact a poison control center or emergency room at once. NOTE: This medicine is only for you. Do not share this medicine with others. What if I miss a dose? Keep appointments for follow-up doses. It is important not to miss your dose. Call your care team if you are unable to keep an appointment. What may interact with this medication? Interactions have not been studied. This list may not describe all possible interactions. Give your health care provider a list of all the medicines, herbs, non-prescription drugs, or dietary supplements you use. Also tell them if you smoke, drink alcohol, or use illegal drugs. Some items may interact with your medicine. What should I watch for while using this medication? Your condition will be monitored carefully while you are receiving this medication. This medication can cause  serious allergic reactions. To reduce your risk, your care team may give you other medication to take before receiving this one. Be sure to follow the directions from your care team. This medication can affect the results of blood tests to match your blood type. These changes can last for up to 6 months after the final dose. Your care team will do blood tests to match your blood type before you start treatment. Tell all of your care team that you are being treated with this medication before receiving a blood transfusion. This medication can affect the results of some tests used to determine treatment response; extra tests may be needed to evaluate response. Talk to your care team if you wish to become pregnant or think you are pregnant. This medication can cause serious birth defects if taken during pregnancy and for 3 months after the last dose. A reliable form of contraception is recommended while taking this medication and for 3 months after the last dose. Talk to your care team about effective forms of contraception. Do not breast-feed while taking this medication. What side effects may I notice from receiving this medication? Side effects that you should report to your care team as soon as possible: Allergic reactions--skin rash, itching, hives, swelling of the face, lips, tongue, or throat Infection--fever, chills, cough, sore throat, wounds that don't heal, pain or trouble when passing urine, general feeling of discomfort or being unwell Infusion reactions--chest pain, shortness of breath or trouble breathing, feeling faint or lightheaded Unusual bruising or bleeding Side effects that usually do not require medical attention (report to your care team if they continue or  are bothersome): Constipation Diarrhea Fatigue Nausea Pain, tingling, or numbness in the hands or feet Swelling of the ankles, hands, or feet This list may not describe all possible side effects. Call your doctor for medical  advice about side effects. You may report side effects to FDA at 1-800-FDA-1088. Where should I keep my medication? This medication is given in a hospital or clinic. It will not be stored at home. NOTE: This sheet is a summary. It may not cover all possible information. If you have questions about this medicine, talk to your doctor, pharmacist, or health care provider.  2024 Elsevier/Gold Standard (2022-01-14 00:00:00)

## 2024-03-23 NOTE — Progress Notes (Signed)
 " Hematology and Oncology Follow Up Visit  Kelly Chase [id number] 16-Jan-1946 79 y.o. 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  She is doing quite nicely.  She had a very nice Thanksgiving.  She is with family.  Also happy that she has done so well with her treatment.  Her myeloma levels have been nice and low and stable.  When we last saw her, the monoclonal spike was 0.2 g/dL.  Her IgG level 735 mg/dL.  The Kappa light chain was 0.8 mg/dL.  She has had no problems with bowels or bladder.  There has been no bleeding.  She has had no cough or shortness of breath.  There has been no leg swelling.  She has had no pain.  Overall, I would say that her performance status is probably ECOG 1.   Wt Readings from Last 3 Encounters:  03/23/24 155 lb 3.2 oz (70.4 kg)  02/23/24 154 lb (69.9 kg)  01/26/24 152 lb (68.9 kg)     Medications:  Allergies as of 03/23/2024       Reactions   Celecoxib Nausea And Vomiting   Chlorthalidone Palpitations   Feldene [piroxicam] Other (See Comments)   abd burning   Esomeprazole Other (See Comments)   Joint pain        Medication List        Accurate as of March 23, 2024 10:15 AM. If you have any questions, ask your nurse or doctor.          acetaminophen  500 MG tablet Commonly known as: TYLENOL  Take 1,000 mg by mouth every 6 (six) hours as needed.   acyclovir  400 MG tablet Commonly known as: ZOVIRAX  TAKE 1 TABLET BY MOUTH TWICE A DAY   amLODipine  10 MG tablet Commonly known as: NORVASC  TAKE 1 TABLET(10 MG) BY MOUTH DAILY   cyclobenzaprine  5 MG tablet Commonly known as: FLEXERIL  Take 1 tablet (5 mg total) by mouth every 8  (eight) hours as needed for muscle spasms.   dexamethasone  4 MG tablet Commonly known as: DECADRON  Take 3 tablets (12 mg total) by mouth See admin instructions. Take 3 tablets the morning of your chemotherapy appointment before arriving to the clinic   gabapentin  100 MG capsule Commonly known as: NEURONTIN  Take by mouth at bedtime as needed.   lidocaine -prilocaine  cream Commonly known as: EMLA  Apply 1 Application topically as needed.   lisinopril  20 MG tablet Commonly known as: ZESTRIL  Take 1 tablet (20 mg total) by mouth daily.   ondansetron  4 MG disintegrating tablet Commonly known as: ZOFRAN -ODT Take 1 tablet (4 mg total) by mouth every 8 (eight) hours as needed for nausea or vomiting.   ondansetron  8 MG tablet Commonly known as: Zofran  Take 8 mg by mouth 30 to 60 min prior to Cyclophosphamide  administration then take 8 mg every 8 hrs as needed for nausea and vomiting.   prochlorperazine  10 MG tablet Commonly known as: COMPAZINE  Take 1 tablet (10 mg total) by mouth every 6 (six) hours as needed for nausea or vomiting.   traZODone  50 MG tablet Commonly known as: DESYREL  TAKE 1 TABLET BY MOUTH EVERYDAY AT BEDTIME  Allergies:  Allergies  Allergen Reactions   Celecoxib Nausea And Vomiting   Chlorthalidone Palpitations   Feldene [Piroxicam] Other (See Comments)    abd burning   Esomeprazole Other (See Comments)    Joint pain    Past Medical History, Surgical history, Social history, and Family History were reviewed and updated.  Review of Systems: Review of Systems  Constitutional: Negative.   HENT: Negative.    Eyes: Negative.   Respiratory: Negative.    Cardiovascular: Negative.   Gastrointestinal: Negative.   Genitourinary: Negative.   Musculoskeletal: Negative.   Skin: Negative.   Neurological: Negative.   Endo/Heme/Allergies: Negative.   Psychiatric/Behavioral: Negative.        Physical Exam:  height is 5' 6 (1.676 m) and weight is 155  lb 3.2 oz (70.4 kg). Her oral temperature is 98.5 F (36.9 C). Her blood pressure is 127/67 and her pulse is 80. Her respiration is 20 and oxygen saturation is 100%.   Wt Readings from Last 3 Encounters:  03/23/24 155 lb 3.2 oz (70.4 kg)  02/23/24 154 lb (69.9 kg)  01/26/24 152 lb (68.9 kg)    Physical Exam Vitals reviewed.  HENT:     Head: Normocephalic and atraumatic.  Eyes:     Pupils: Pupils are equal, round, and reactive to light.  Cardiovascular:     Rate and Rhythm: Normal rate and regular rhythm.     Heart sounds: Normal heart sounds.  Pulmonary:     Effort: Pulmonary effort is normal.     Breath sounds: Normal breath sounds.  Abdominal:     General: Bowel sounds are normal.     Palpations: Abdomen is soft.  Musculoskeletal:        General: No swelling, tenderness or deformity. Normal range of motion.     Cervical back: Normal range of motion.     Left lower leg: No edema.     Comments: Non tender to palpation. No erythema, edema, or palpable bony abnormalities   Lymphadenopathy:     Cervical: No cervical adenopathy.  Skin:    General: Skin is warm and dry.     Findings: No erythema or rash.  Neurological:     Mental Status: She is alert and oriented to person, place, and time.  Psychiatric:        Behavior: Behavior normal.        Thought Content: Thought content normal.        Judgment: Judgment normal.      Lab Results  Component Value Date   WBC 3.8 (L) 03/23/2024   HGB 10.4 (L) 03/23/2024   HCT 30.5 (L) 03/23/2024   MCV 86.2 03/23/2024   PLT 354 03/23/2024   Lab Results  Component Value Date   FERRITIN 732 (H) 07/07/2023   IRON 49 07/07/2023   TIBC 342 07/07/2023   UIBC 293 07/07/2023   IRONPCTSAT 14 07/07/2023   Lab Results  Component Value Date   RETICCTPCT 2.1 07/07/2023   RBC 3.54 (L) 03/23/2024   Lab Results  Component Value Date   KPAFRELGTCHN 8.8 02/23/2024   LAMBDASER 5.7 02/23/2024   KAPLAMBRATIO 1.54 02/23/2024   Lab  Results  Component Value Date   IGGSERUM 751 02/23/2024   IGA 56 (L) 02/23/2024   IGMSERUM 17 (L) 02/23/2024   Lab Results  Component Value Date   TOTALPROTELP 6.1 02/23/2024   ALBUMINELP 3.9 02/23/2024   A1GS 0.2 02/23/2024   A2GS 0.5 02/23/2024   BETS 0.9 02/23/2024  BETA2SER 0.5 09/24/2014   GAMS 0.6 02/23/2024   MSPIKE 0.2 (H) 02/23/2024   SPEI Comment 06/09/2023     Chemistry      Component Value Date/Time   NA 139 02/23/2024 0909   NA 139 10/10/2015 0938   K 3.9 02/23/2024 0909   K 4.2 10/10/2015 0938   CL 106 02/23/2024 0909   CL 105 04/11/2015 0953   CO2 21 (L) 02/23/2024 0909   CO2 25 10/10/2015 0938   BUN 15 02/23/2024 0909   BUN 13.6 10/10/2015 0938   CREATININE 0.81 02/23/2024 0909   CREATININE 1.0 10/10/2015 0938      Component Value Date/Time   CALCIUM 9.0 02/23/2024 0909   CALCIUM 9.7 10/10/2015 0938   ALKPHOS 44 02/23/2024 0909   ALKPHOS 104 10/10/2015 0938   AST 18 02/23/2024 0909   AST 18 10/10/2015 0938   ALT 13 02/23/2024 0909   ALT 20 10/10/2015 0938   BILITOT 0.6 02/23/2024 0909   BILITOT 0.80 10/10/2015 0938     Impression and Plan: Ms. Mcgahee is a pleasant 79 yo African American female with progressive IgG kappa myeloma.   She currently is on Faspro.  I think that as long as the levels are this low, she was should never have a problem.  We will go ahead with Faspro today.    I would like to get another PET scan on her.  The last PET scan was done back in June 2025.  Hopefully, if everything looks good, maybe we can move her Faspro treatments out a little bit longer.   1/2/202610:15 AM  "

## 2024-03-24 LAB — IGG, IGA, IGM
IgA: 60 mg/dL — ABNORMAL LOW (ref 64–422)
IgG (Immunoglobin G), Serum: 747 mg/dL (ref 586–1602)
IgM (Immunoglobulin M), Srm: 19 mg/dL — ABNORMAL LOW (ref 26–217)

## 2024-03-24 LAB — ERYTHROPOIETIN: Erythropoietin: 30.1 m[IU]/mL — ABNORMAL HIGH (ref 2.6–18.5)

## 2024-03-26 LAB — KAPPA/LAMBDA LIGHT CHAINS
Kappa free light chain: 8 mg/L (ref 3.3–19.4)
Kappa, lambda light chain ratio: 1.43 (ref 0.26–1.65)
Lambda free light chains: 5.6 mg/L — ABNORMAL LOW (ref 5.7–26.3)

## 2024-03-27 ENCOUNTER — Other Ambulatory Visit: Payer: Self-pay

## 2024-03-30 LAB — PROTEIN ELECTROPHORESIS, SERUM, WITH REFLEX
A/G Ratio: 1.4 (ref 0.7–1.7)
Albumin ELP: 3.6 g/dL (ref 2.9–4.4)
Alpha-1-Globulin: 0.3 g/dL (ref 0.0–0.4)
Alpha-2-Globulin: 0.6 g/dL (ref 0.4–1.0)
Beta Globulin: 0.9 g/dL (ref 0.7–1.3)
Gamma Globulin: 0.7 g/dL (ref 0.4–1.8)
Globulin, Total: 2.5 g/dL (ref 2.2–3.9)
M-Spike, %: 0.3 g/dL — ABNORMAL HIGH
SPEP Interpretation: 0
Total Protein ELP: 6.1 g/dL (ref 6.0–8.5)

## 2024-03-30 LAB — IMMUNOFIXATION REFLEX, SERUM
IgA: 55 mg/dL — ABNORMAL LOW (ref 64–422)
IgG (Immunoglobin G), Serum: 730 mg/dL (ref 586–1602)
IgM (Immunoglobulin M), Srm: 19 mg/dL — ABNORMAL LOW (ref 26–217)

## 2024-04-03 ENCOUNTER — Encounter: Payer: Self-pay | Admitting: Hematology & Oncology

## 2024-04-04 ENCOUNTER — Encounter: Payer: Self-pay | Admitting: Hematology & Oncology

## 2024-04-06 ENCOUNTER — Encounter: Payer: Self-pay | Admitting: Hematology & Oncology

## 2024-04-06 ENCOUNTER — Encounter (HOSPITAL_COMMUNITY)
Admission: RE | Admit: 2024-04-06 | Discharge: 2024-04-06 | Disposition: A | Discharge status: Home / Self Care | Source: Ambulatory Visit | Attending: Hematology & Oncology | Admitting: Hematology & Oncology

## 2024-04-06 DIAGNOSIS — C9000 Multiple myeloma not having achieved remission: Secondary | ICD-10-CM | POA: Insufficient documentation

## 2024-04-06 LAB — GLUCOSE, CAPILLARY: Glucose-Capillary: 102 mg/dL — ABNORMAL HIGH (ref 70–99)

## 2024-04-06 MED ORDER — FLUDEOXYGLUCOSE F - 18 (FDG) INJECTION
7.5000 | Freq: Once | INTRAVENOUS | Status: AC | PRN
  Administered 2024-04-06: 7.5 via INTRAVENOUS

## 2024-04-09 ENCOUNTER — Encounter (HOSPITAL_COMMUNITY)

## 2024-04-10 DIAGNOSIS — Z5112 Encounter for antineoplastic immunotherapy: Secondary | ICD-10-CM | POA: Diagnosis not present

## 2024-04-12 ENCOUNTER — Ambulatory Visit: Payer: Self-pay | Admitting: Hematology & Oncology

## 2024-04-14 ENCOUNTER — Other Ambulatory Visit: Payer: Self-pay

## 2024-04-17 ENCOUNTER — Other Ambulatory Visit: Payer: Self-pay

## 2024-04-19 ENCOUNTER — Other Ambulatory Visit: Payer: Self-pay

## 2024-04-19 ENCOUNTER — Inpatient Hospital Stay

## 2024-04-19 ENCOUNTER — Encounter: Payer: Self-pay | Admitting: Hematology & Oncology

## 2024-04-19 ENCOUNTER — Inpatient Hospital Stay: Admitting: Hematology & Oncology

## 2024-04-19 VITALS — BP 144/69 | HR 89 | Temp 97.5°F | Resp 18 | Ht 66.0 in | Wt 163.0 lb

## 2024-04-19 VITALS — BP 113/50 | HR 74

## 2024-04-19 DIAGNOSIS — C9000 Multiple myeloma not having achieved remission: Secondary | ICD-10-CM

## 2024-04-19 DIAGNOSIS — Z5112 Encounter for antineoplastic immunotherapy: Secondary | ICD-10-CM | POA: Diagnosis not present

## 2024-04-19 LAB — CBC WITH DIFFERENTIAL (CANCER CENTER ONLY)
Abs Immature Granulocytes: 0.01 10*3/uL (ref 0.00–0.07)
Basophils Absolute: 0 10*3/uL (ref 0.0–0.1)
Basophils Relative: 1 %
Eosinophils Absolute: 0.1 10*3/uL (ref 0.0–0.5)
Eosinophils Relative: 3 %
HCT: 30.6 % — ABNORMAL LOW (ref 36.0–46.0)
Hemoglobin: 10.2 g/dL — ABNORMAL LOW (ref 12.0–15.0)
Immature Granulocytes: 0 %
Lymphocytes Relative: 24 %
Lymphs Abs: 0.9 10*3/uL (ref 0.7–4.0)
MCH: 29.6 pg (ref 26.0–34.0)
MCHC: 33.3 g/dL (ref 30.0–36.0)
MCV: 88.7 fL (ref 80.0–100.0)
Monocytes Absolute: 0.4 10*3/uL (ref 0.1–1.0)
Monocytes Relative: 12 %
Neutro Abs: 2.2 10*3/uL (ref 1.7–7.7)
Neutrophils Relative %: 60 %
Platelet Count: 351 10*3/uL (ref 150–400)
RBC: 3.45 MIL/uL — ABNORMAL LOW (ref 3.87–5.11)
RDW: 15.1 % (ref 11.5–15.5)
WBC Count: 3.6 10*3/uL — ABNORMAL LOW (ref 4.0–10.5)
nRBC: 0 % (ref 0.0–0.2)

## 2024-04-19 LAB — CMP (CANCER CENTER ONLY)
ALT: 19 U/L (ref 0–44)
AST: 20 U/L (ref 15–41)
Albumin: 4.4 g/dL (ref 3.5–5.0)
Alkaline Phosphatase: 52 U/L (ref 38–126)
Anion gap: 11 (ref 5–15)
BUN: 17 mg/dL (ref 8–23)
CO2: 24 mmol/L (ref 22–32)
Calcium: 9.1 mg/dL (ref 8.9–10.3)
Chloride: 107 mmol/L (ref 98–111)
Creatinine: 0.93 mg/dL (ref 0.44–1.00)
GFR, Estimated: >60 mL/min
Glucose, Bld: 127 mg/dL — ABNORMAL HIGH (ref 70–99)
Potassium: 3.8 mmol/L (ref 3.5–5.1)
Sodium: 142 mmol/L (ref 135–145)
Total Bilirubin: 0.7 mg/dL (ref 0.0–1.2)
Total Protein: 6.4 g/dL — ABNORMAL LOW (ref 6.5–8.1)

## 2024-04-19 MED ORDER — DEXAMETHASONE 4 MG PO TABS: 12.0000 mg | ORAL_TABLET | Freq: Once | ORAL | Status: DC

## 2024-04-19 MED ORDER — DARATUMUMAB-HYALURONIDASE-FIHJ 1800-30000 MG-UT/15ML ~~LOC~~ SOLN
1800.0000 mg | Freq: Once | SUBCUTANEOUS | Status: AC
  Administered 2024-04-19: 1800 mg via SUBCUTANEOUS
  Filled 2024-04-19: qty 15

## 2024-04-19 MED ORDER — DIPHENHYDRAMINE HCL 25 MG PO CAPS: 50.0000 mg | ORAL_CAPSULE | Freq: Once | ORAL | Status: DC

## 2024-04-19 MED ORDER — ACETAMINOPHEN 325 MG PO TABS: 650.0000 mg | ORAL_TABLET | Freq: Once | ORAL | Status: DC

## 2024-04-19 NOTE — Progress Notes (Signed)
 " Hematology and Oncology Follow Up Visit  Kelly Chase [id number] 03/11/1946 79 y.o. 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  We last saw her back on 03/23/2024.  She is doing quite nicely.  She had a very nice Thanksgiving.  She is with family.  Also happy that she has done so well with her treatment.  Her myeloma levels have been nice and low and stable.  When we last saw her, the monoclonal spike was 0.3 g/dL.  Her IgG level 735 mg/dL.  The Kappa light chain was 0.8 mg/dL.  She did have another PET scan that was done.  This was done on 04/06/2024.  There is no evidence of metabolically active disease in her bones.  She has had no problems with bowels or bladder.  There has been no bleeding.  She has had no cough or shortness of breath.  There has been no leg swelling.  She has had no pain.  I have noted that her hemoglobin has been on the lower side.  We checked an erythropoietin  level on her back in early January.  It was only 30.  As such, if we needed to, we could always use ESA.  Overall, I would say that her performance status is probably ECOG 1.   Wt Readings from Last 3 Encounters:  04/19/24 163 lb (73.9 kg)  03/23/24 155 lb 3.2 oz (70.4 kg)  02/23/24 154 lb (69.9 kg)     Medications:  Allergies as of 04/19/2024       Reactions   Celecoxib Nausea And Vomiting   Chlorthalidone Palpitations   Feldene [piroxicam] Other (See Comments)   abd burning   Esomeprazole Other (See Comments)   Joint pain        Medication List        Accurate as of April 19, 2024  9:27 AM. If you have any questions, ask your nurse or doctor.          acetaminophen  500 MG  tablet Commonly known as: TYLENOL  Take 1,000 mg by mouth every 6 (six) hours as needed.   acyclovir  400 MG tablet Commonly known as: ZOVIRAX  TAKE 1 TABLET BY MOUTH TWICE A DAY   amLODipine  10 MG tablet Commonly known as: NORVASC  TAKE 1 TABLET(10 MG) BY MOUTH DAILY   cyclobenzaprine  5 MG tablet Commonly known as: FLEXERIL  Take 1 tablet (5 mg total) by mouth every 8 (eight) hours as needed for muscle spasms.   dexamethasone  4 MG tablet Commonly known as: DECADRON  Take 3 tablets (12 mg total) by mouth See admin instructions. Take 3 tablets the morning of your chemotherapy appointment before arriving to the clinic   gabapentin  100 MG capsule Commonly known as: NEURONTIN  Take by mouth at bedtime as needed.   lidocaine -prilocaine  cream Commonly known as: EMLA  Apply 1 Application topically as needed.   lisinopril  20 MG tablet Commonly known as: ZESTRIL  Take 1 tablet (20 mg total) by mouth daily.   ondansetron  4 MG disintegrating tablet Commonly known as: ZOFRAN -ODT Take 1 tablet (4 mg total) by mouth every 8 (eight) hours as needed for nausea or vomiting.   ondansetron  8 MG tablet Commonly  known as: Zofran  Take 8 mg by mouth 30 to 60 min prior to Cyclophosphamide  administration then take 8 mg every 8 hrs as needed for nausea and vomiting.   prochlorperazine  10 MG tablet Commonly known as: COMPAZINE  Take 1 tablet (10 mg total) by mouth every 6 (six) hours as needed for nausea or vomiting.   traZODone  50 MG tablet Commonly known as: DESYREL  TAKE 1 TABLET BY MOUTH EVERYDAY AT BEDTIME        Allergies:  Allergies  Allergen Reactions   Celecoxib Nausea And Vomiting   Chlorthalidone Palpitations   Feldene [Piroxicam] Other (See Comments)    abd burning   Esomeprazole Other (See Comments)    Joint pain    Past Medical History, Surgical history, Social history, and Family History were reviewed and updated.  Review of Systems: Review of Systems  Constitutional:  Negative.   HENT: Negative.    Eyes: Negative.   Respiratory: Negative.    Cardiovascular: Negative.   Gastrointestinal: Negative.   Genitourinary: Negative.   Musculoskeletal: Negative.   Skin: Negative.   Neurological: Negative.   Endo/Heme/Allergies: Negative.   Psychiatric/Behavioral: Negative.        Physical Exam:  height is 5' 6 (1.676 m) and weight is 163 lb (73.9 kg). Her oral temperature is 97.5 F (36.4 C) (abnormal). Her blood pressure is 144/69 (abnormal) and her pulse is 89. Her respiration is 18 and oxygen saturation is 100%.   Wt Readings from Last 3 Encounters:  04/19/24 163 lb (73.9 kg)  03/23/24 155 lb 3.2 oz (70.4 kg)  02/23/24 154 lb (69.9 kg)    Physical Exam Vitals reviewed.  HENT:     Head: Normocephalic and atraumatic.  Eyes:     Pupils: Pupils are equal, round, and reactive to light.  Cardiovascular:     Rate and Rhythm: Normal rate and regular rhythm.     Heart sounds: Normal heart sounds.  Pulmonary:     Effort: Pulmonary effort is normal.     Breath sounds: Normal breath sounds.  Abdominal:     General: Bowel sounds are normal.     Palpations: Abdomen is soft.  Musculoskeletal:        General: No swelling, tenderness or deformity. Normal range of motion.     Cervical back: Normal range of motion.     Left lower leg: No edema.     Comments: Non tender to palpation. No erythema, edema, or palpable bony abnormalities   Lymphadenopathy:     Cervical: No cervical adenopathy.  Skin:    General: Skin is warm and dry.     Findings: No erythema or rash.  Neurological:     Mental Status: She is alert and oriented to person, place, and time.  Psychiatric:        Behavior: Behavior normal.        Thought Content: Thought content normal.        Judgment: Judgment normal.      Lab Results  Component Value Date   WBC 3.6 (L) 04/19/2024   HGB 10.2 (L) 04/19/2024   HCT 30.6 (L) 04/19/2024   MCV 88.7 04/19/2024   PLT 351 04/19/2024   Lab  Results  Component Value Date   FERRITIN 452 (H) 03/23/2024   IRON 73 03/23/2024   TIBC 333 03/23/2024   UIBC 260 03/23/2024   IRONPCTSAT 22 03/23/2024   Lab Results  Component Value Date   RETICCTPCT 2.1 07/07/2023   RBC 3.45 (L) 04/19/2024  Lab Results  Component Value Date   KPAFRELGTCHN 8.0 03/23/2024   LAMBDASER 5.6 (L) 03/23/2024   KAPLAMBRATIO 1.43 03/23/2024   Lab Results  Component Value Date   IGGSERUM 747 03/23/2024   IGGSERUM 730 03/23/2024   IGA 60 (L) 03/23/2024   IGA 55 (L) 03/23/2024   IGMSERUM 19 (L) 03/23/2024   IGMSERUM 19 (L) 03/23/2024   Lab Results  Component Value Date   TOTALPROTELP 6.1 03/23/2024   ALBUMINELP 3.6 03/23/2024   A1GS 0.3 03/23/2024   A2GS 0.6 03/23/2024   BETS 0.9 03/23/2024   BETA2SER 0.5 09/24/2014   GAMS 0.7 03/23/2024   MSPIKE 0.3 (H) 03/23/2024   SPEI Comment 06/09/2023     Chemistry      Component Value Date/Time   NA 142 04/19/2024 0845   NA 139 10/10/2015 0938   K 3.8 04/19/2024 0845   K 4.2 10/10/2015 0938   CL 107 04/19/2024 0845   CL 105 04/11/2015 0953   CO2 24 04/19/2024 0845   CO2 25 10/10/2015 0938   BUN 17 04/19/2024 0845   BUN 13.6 10/10/2015 0938   CREATININE 0.93 04/19/2024 0845   CREATININE 1.0 10/10/2015 0938      Component Value Date/Time   CALCIUM 9.1 04/19/2024 0845   CALCIUM 9.7 10/10/2015 0938   ALKPHOS 52 04/19/2024 0845   ALKPHOS 104 10/10/2015 0938   AST 20 04/19/2024 0845   AST 18 10/10/2015 0938   ALT 19 04/19/2024 0845   ALT 20 10/10/2015 0938   BILITOT 0.7 04/19/2024 0845   BILITOT 0.80 10/10/2015 0938     Impression and Plan: Kelly Chase is a pleasant 79 yo African American female with progressive IgG kappa myeloma.   She currently is on Faspro.  I think that as long as the levels are this low, she was should never have a problem.  We will go ahead with Faspro today.  Maybe, if we see that her levels are still low and stable, we might be able to spread her appointments out  a little bit longer.  Again, this is all about quality of life.  She will have her 79th birthday coming up in about 6 weeks or so.  I am very happy for her.  I would like to see her back in another month.   1/29/20269:27 AM  "

## 2024-04-19 NOTE — Patient Instructions (Signed)

## 2024-04-19 NOTE — Patient Instructions (Signed)
 Daratumumab  Injection What is this medication? DARATUMUMAB  (dar a toom ue mab) treats multiple myeloma, a type of bone marrow cancer. It works by helping your immune system slow or stop the spread of cancer cells. It is a monoclonal antibody. This medicine may be used for other purposes; ask your health care provider or pharmacist if you have questions. COMMON BRAND NAME(S): DARZALEX  What should I tell my care team before I take this medication? They need to know if you have any of these conditions: Hereditary fructose intolerance Infection, such as chickenpox, herpes, hepatitis B Lung or breathing disease, such as asthma, COPD An unusual or allergic reaction to daratumumab , sorbitol, other medications, foods, dyes, or preservatives Pregnant or trying to get pregnant Breastfeeding How should I use this medication? This medication is injected into a vein. It is given by your care team in a hospital or clinic setting. Talk to your care team about the use of this medication in children. Special care may be needed. Overdosage: If you think you have taken too much of this medicine contact a poison control center or emergency room at once. NOTE: This medicine is only for you. Do not share this medicine with others. What if I miss a dose? Keep appointments for follow-up doses. It is important not to miss your dose. Call your care team if you are unable to keep an appointment. What may interact with this medication? Interactions have not been studied. This list may not describe all possible interactions. Give your health care provider a list of all the medicines, herbs, non-prescription drugs, or dietary supplements you use. Also tell them if you smoke, drink alcohol, or use illegal drugs. Some items may interact with your medicine. What should I watch for while using this medication? Your condition will be monitored carefully while you are receiving this medication. This medication can cause  serious allergic reactions. To reduce your risk, your care team may give you other medication to take before receiving this one. Be sure to follow the directions from your care team. This medication can affect the results of blood tests to match your blood type. These changes can last for up to 6 months after the final dose. Your care team will do blood tests to match your blood type before you start treatment. Tell all of your care team that you are being treated with this medication before receiving a blood transfusion. This medication can affect the results of some tests used to determine treatment response; extra tests may be needed to evaluate response. Talk to your care team if you wish to become pregnant or think you are pregnant. This medication can cause serious birth defects if taken during pregnancy and for 3 months after the last dose. A reliable form of contraception is recommended while taking this medication and for 3 months after the last dose. Talk to your care team about effective forms of contraception. Do not breast-feed while taking this medication. What side effects may I notice from receiving this medication? Side effects that you should report to your care team as soon as possible: Allergic reactions--skin rash, itching, hives, swelling of the face, lips, tongue, or throat Infection--fever, chills, cough, sore throat, wounds that don't heal, pain or trouble when passing urine, general feeling of discomfort or being unwell Infusion reactions--chest pain, shortness of breath or trouble breathing, feeling faint or lightheaded Unusual bruising or bleeding Side effects that usually do not require medical attention (report to your care team if they continue or  are bothersome): Constipation Diarrhea Fatigue Nausea Pain, tingling, or numbness in the hands or feet Swelling of the ankles, hands, or feet This list may not describe all possible side effects. Call your doctor for medical  advice about side effects. You may report side effects to FDA at 1-800-FDA-1088. Where should I keep my medication? This medication is given in a hospital or clinic. It will not be stored at home. NOTE: This sheet is a summary. It may not cover all possible information. If you have questions about this medicine, talk to your doctor, pharmacist, or health care provider.  2024 Elsevier/Gold Standard (2022-01-14 00:00:00)

## 2024-04-20 ENCOUNTER — Inpatient Hospital Stay: Admitting: Hematology & Oncology

## 2024-04-20 ENCOUNTER — Inpatient Hospital Stay

## 2024-04-20 LAB — IGG, IGA, IGM
IgA: 64 mg/dL (ref 64–422)
IgG (Immunoglobin G), Serum: 721 mg/dL (ref 586–1602)
IgM (Immunoglobulin M), Srm: 25 mg/dL — ABNORMAL LOW (ref 26–217)

## 2024-04-20 LAB — KAPPA/LAMBDA LIGHT CHAINS
Kappa free light chain: 9.5 mg/L (ref 3.3–19.4)
Kappa, lambda light chain ratio: 1.34 (ref 0.26–1.65)
Lambda free light chains: 7.1 mg/L (ref 5.7–26.3)

## 2024-04-22 ENCOUNTER — Other Ambulatory Visit: Payer: Self-pay

## 2024-04-24 ENCOUNTER — Other Ambulatory Visit: Payer: Self-pay

## 2024-04-25 LAB — IMMUNOFIXATION REFLEX, SERUM
IgA: 59 mg/dL — ABNORMAL LOW (ref 64–422)
IgG (Immunoglobin G), Serum: 775 mg/dL (ref 586–1602)
IgM (Immunoglobulin M), Srm: 31 mg/dL (ref 26–217)

## 2024-04-25 LAB — PROTEIN ELECTROPHORESIS, SERUM, WITH REFLEX
A/G Ratio: 1.7 (ref 0.7–1.7)
Albumin ELP: 3.9 g/dL (ref 2.9–4.4)
Alpha-1-Globulin: 0.3 g/dL (ref 0.0–0.4)
Alpha-2-Globulin: 0.5 g/dL (ref 0.4–1.0)
Beta Globulin: 0.9 g/dL (ref 0.7–1.3)
Gamma Globulin: 0.6 g/dL (ref 0.4–1.8)
Globulin, Total: 2.3 g/dL (ref 2.2–3.9)
M-Spike, %: 0.2 g/dL — ABNORMAL HIGH
SPEP Interpretation: 0
Total Protein ELP: 6.2 g/dL (ref 6.0–8.5)

## 2024-05-17 ENCOUNTER — Inpatient Hospital Stay

## 2024-05-17 ENCOUNTER — Inpatient Hospital Stay: Admitting: Hematology & Oncology

## 2024-06-14 ENCOUNTER — Inpatient Hospital Stay: Admitting: Hematology & Oncology

## 2024-06-14 ENCOUNTER — Inpatient Hospital Stay

## 2024-06-14 ENCOUNTER — Inpatient Hospital Stay: Attending: Hematology & Oncology
